# Patient Record
Sex: Female | Born: 1945
Health system: Southern US, Community
[De-identification: ages and names within clinical notes are randomized; demographics above are authoritative.]

## PROBLEM LIST (undated history)

## (undated) DIAGNOSIS — E7800 Pure hypercholesterolemia, unspecified: Secondary | ICD-10-CM

## (undated) DIAGNOSIS — J45909 Unspecified asthma, uncomplicated: Secondary | ICD-10-CM

## (undated) DIAGNOSIS — T7840XA Allergy, unspecified, initial encounter: Secondary | ICD-10-CM

## (undated) DIAGNOSIS — D649 Anemia, unspecified: Secondary | ICD-10-CM

## (undated) DIAGNOSIS — K219 Gastro-esophageal reflux disease without esophagitis: Secondary | ICD-10-CM

## (undated) DIAGNOSIS — I10 Essential (primary) hypertension: Secondary | ICD-10-CM

## (undated) HISTORY — DX: Anemia, unspecified: D64.9

## (undated) HISTORY — PX: BREAST SURGERY: SHX581

## (undated) HISTORY — DX: Gastro-esophageal reflux disease without esophagitis: K21.9

## (undated) HISTORY — DX: Essential (primary) hypertension: I10

## (undated) HISTORY — PX: TUBAL LIGATION: SHX77

## (undated) HISTORY — DX: Unspecified asthma, uncomplicated: J45.909

## (undated) HISTORY — DX: Pure hypercholesterolemia, unspecified: E78.00

## (undated) HISTORY — DX: Allergy, unspecified, initial encounter: T78.40XA

---

## 2004-12-02 ENCOUNTER — Ambulatory Visit: Payer: Self-pay | Admitting: Internal Medicine

## 2005-01-13 ENCOUNTER — Ambulatory Visit: Payer: Self-pay | Admitting: Internal Medicine

## 2005-02-25 ENCOUNTER — Ambulatory Visit: Payer: Self-pay | Admitting: Internal Medicine

## 2005-07-02 ENCOUNTER — Ambulatory Visit: Payer: Self-pay | Admitting: Internal Medicine

## 2005-07-09 ENCOUNTER — Ambulatory Visit: Payer: Self-pay | Admitting: Internal Medicine

## 2012-05-25 ENCOUNTER — Encounter: Payer: Self-pay | Admitting: Nurse Practitioner

## 2012-05-25 ENCOUNTER — Ambulatory Visit (INDEPENDENT_AMBULATORY_CARE_PROVIDER_SITE_OTHER): Payer: Medicare Other | Admitting: Nurse Practitioner

## 2012-05-25 VITALS — BP 132/74 | HR 73 | Temp 97.3°F | Ht 63.0 in | Wt 155.0 lb

## 2012-05-25 DIAGNOSIS — J45909 Unspecified asthma, uncomplicated: Secondary | ICD-10-CM | POA: Insufficient documentation

## 2012-05-25 DIAGNOSIS — J309 Allergic rhinitis, unspecified: Secondary | ICD-10-CM

## 2012-05-25 DIAGNOSIS — I10 Essential (primary) hypertension: Secondary | ICD-10-CM | POA: Insufficient documentation

## 2012-05-25 LAB — COMPLETE METABOLIC PANEL WITH GFR
Albumin: 4.4 g/dL (ref 3.5–5.2)
Alkaline Phosphatase: 57 U/L (ref 39–117)
BUN: 15 mg/dL (ref 6–23)
CO2: 28 mEq/L (ref 19–32)
Calcium: 9.6 mg/dL (ref 8.4–10.5)
Chloride: 103 mEq/L (ref 96–112)
GFR, Est African American: 81 mL/min
GFR, Est Non African American: 71 mL/min
Glucose, Bld: 111 mg/dL — ABNORMAL HIGH (ref 70–99)
Potassium: 4.1 mEq/L (ref 3.5–5.3)
Total Protein: 6.9 g/dL (ref 6.0–8.3)

## 2012-05-25 MED ORDER — BECLOMETHASONE DIPROPIONATE 80 MCG/ACT IN AERS: 2.0000 | INHALATION_SPRAY | Freq: Two times a day (BID) | RESPIRATORY_TRACT | Status: DC

## 2012-05-25 MED ORDER — FLUTICASONE PROPIONATE 50 MCG/ACT NA SUSP: 2.0000 | Freq: Every day | NASAL | Status: DC

## 2012-05-25 MED ORDER — OLMESARTAN MEDOXOMIL-HCTZ 40-25 MG PO TABS: 1.0000 | ORAL_TABLET | Freq: Every day | ORAL | Status: DC

## 2012-05-25 NOTE — Progress Notes (Signed)
Subjective:    Patient ID: Regina Knox, female    DOB: 14-Jul-1945, 67 y.o.   MRN: [id number]  Hypertension This is a chronic problem. The current episode started more than 1 year ago. The problem has been resolved since onset. Pertinent negatives include no blurred vision, chest pain, headaches, neck pain, palpitations, peripheral edema or shortness of breath. There are no associated agents to hypertension. Risk factors for coronary artery disease include family history and post-menopausal state. Past treatments include angiotensin blockers and diuretics. The current treatment provides significant improvement. Compliance problems include exercise and diet.  There is no history of kidney disease, heart failure or a thyroid problem. There is no history of chronic renal disease or sleep apnea.  Asthma She complains of frequent throat clearing. There is no chest tightness, cough, difficulty breathing or shortness of breath. This is a chronic problem. The current episode started more than 1 year ago. The problem occurs rarely. The problem has been gradually improving. Associated symptoms include nasal congestion, postnasal drip and sneezing. Pertinent negatives include no chest pain, ear congestion, ear pain, fever, headaches, heartburn, sore throat or trouble swallowing. Her symptoms are aggravated by nothing. Her symptoms are alleviated by rest. She reports moderate improvement on treatment. There are no known risk factors for lung disease. Her past medical history is significant for asthma. There is no history of COPD, emphysema or pneumonia.      Review of Systems  Constitutional: Negative.  Negative for fever.  HENT: Positive for sneezing and postnasal drip. Negative for ear pain, sore throat, trouble swallowing and neck pain.   Eyes: Negative.  Negative for blurred vision.  Respiratory: Negative.  Negative for cough and shortness of breath.   Cardiovascular: Negative.  Negative for chest  pain and palpitations.  Gastrointestinal: Negative.  Negative for heartburn.  Endocrine: Negative.   Genitourinary: Negative.   Musculoskeletal: Negative.   Allergic/Immunologic: Positive for environmental allergies (seasonal).  Neurological: Negative.  Negative for headaches.  Psychiatric/Behavioral: Negative.        Objective:   Physical Exam  Constitutional: She is oriented to person, place, and time. She appears well-developed and well-nourished.  HENT:  Nose: Nose normal.  Mouth/Throat: Oropharynx is clear and moist.  Eyes: EOM are normal.  Neck: Trachea normal, normal range of motion and full passive range of motion without pain. Neck supple. No JVD present. Carotid bruit is not present. No thyromegaly present.  Cardiovascular: Normal rate, normal heart sounds and intact distal pulses.  Exam reveals no gallop and no friction rub.   No murmur heard. Pulmonary/Chest: Effort normal and breath sounds normal.  Abdominal: Soft. Bowel sounds are normal. She exhibits no distension and no mass. There is no tenderness.  Musculoskeletal: Normal range of motion.  Lymphadenopathy:    She has no cervical adenopathy.  Neurological: She is alert and oriented to person, place, and time. She has normal reflexes.  Skin: Skin is warm and dry.  Psychiatric: She has a normal mood and affect. Her behavior is normal. Judgment and thought content normal.     BP 132/74  Pulse 73  Temp(Src) 97.3 F (36.3 C) (Oral)  Ht 5\' 3"  (1.6 m)  Wt 155 lb (70.308 kg)  BMI 27.46 kg/m2  LMP 05/25/1996      Assessment & Plan:  1. Asthma, chronic AVOID ALLERGENS - beclomethasone (QVAR) 80 MCG/ACT inhaler; Inhale 2 puffs into the lungs 2 (two) times daily.  Dispense: 1 Inhaler; Refill: 3  2. Essential hypertension, benign Low  fat and low Na+ diet Exercise encouraged - olmesartan-hydrochlorothiazide (BENICAR HCT) 40-25 MG per tablet; Take 1 tablet by mouth daily.  Dispense: 30 tablet; Refill: 5 -  COMPLETE METABOLIC PANEL WITH GFR - NMR Lipoprofile with Lipids  3. Allergic rhinitis - fluticasone (FLONASE) 50 MCG/ACT nasal spray; Place 2 sprays into the nose daily.  Dispense: 1 g; Refill: 5   Health Maintenance Review and Hemocult cards given Mary-Margaret Daphine Deutscher, FNP

## 2012-05-25 NOTE — Patient Instructions (Addendum)

## 2012-05-27 ENCOUNTER — Other Ambulatory Visit: Payer: Self-pay | Admitting: Nurse Practitioner

## 2012-05-27 LAB — NMR LIPOPROFILE WITH LIPIDS
HDL Size: 9 nm — ABNORMAL LOW (ref >=9.2)
LDL Particle Number: 1359 nmol/L — ABNORMAL HIGH (ref <1000)
Large HDL-P: 7.6 umol/L (ref >=4.8)
Large VLDL-P: 2 nmol/L (ref <=2.7)
Small LDL Particle Number: 644 nmol/L — ABNORMAL HIGH (ref <=527)
VLDL Size: 42.4 nm (ref <=46.6)

## 2012-05-27 MED ORDER — SIMVASTATIN 40 MG PO TABS: 40.0000 mg | ORAL_TABLET | Freq: Every day | ORAL | Status: DC

## 2012-05-30 ENCOUNTER — Other Ambulatory Visit (INDEPENDENT_AMBULATORY_CARE_PROVIDER_SITE_OTHER): Payer: Medicare Other

## 2012-05-30 DIAGNOSIS — Z1212 Encounter for screening for malignant neoplasm of rectum: Secondary | ICD-10-CM

## 2012-05-30 NOTE — Progress Notes (Signed)
Pt dropped off FOBT only 

## 2012-12-05 ENCOUNTER — Other Ambulatory Visit: Payer: Self-pay

## 2012-12-05 DIAGNOSIS — J45909 Unspecified asthma, uncomplicated: Secondary | ICD-10-CM

## 2012-12-05 MED ORDER — BECLOMETHASONE DIPROPIONATE 80 MCG/ACT IN AERS: 2.0000 | INHALATION_SPRAY | Freq: Two times a day (BID) | RESPIRATORY_TRACT | Status: DC

## 2012-12-05 NOTE — Telephone Encounter (Signed)
Last seen 05/25/12  MMM

## 2012-12-22 ENCOUNTER — Other Ambulatory Visit: Payer: Self-pay

## 2013-03-09 ENCOUNTER — Other Ambulatory Visit: Payer: Self-pay | Admitting: *Deleted

## 2013-03-09 DIAGNOSIS — J45909 Unspecified asthma, uncomplicated: Secondary | ICD-10-CM

## 2013-03-09 MED ORDER — BECLOMETHASONE DIPROPIONATE 80 MCG/ACT IN AERS: 2.0000 | INHALATION_SPRAY | Freq: Two times a day (BID) | RESPIRATORY_TRACT | Status: DC

## 2013-03-09 NOTE — Telephone Encounter (Signed)
Last OV 4/14. ntbs

## 2013-04-20 ENCOUNTER — Other Ambulatory Visit: Payer: Self-pay | Admitting: Nurse Practitioner

## 2013-11-05 ENCOUNTER — Emergency Department (HOSPITAL_COMMUNITY): Payer: Medicare Other

## 2013-11-05 ENCOUNTER — Encounter (HOSPITAL_COMMUNITY): Payer: Self-pay | Admitting: Emergency Medicine

## 2013-11-05 ENCOUNTER — Observation Stay (HOSPITAL_COMMUNITY)
Admission: EM | Admit: 2013-11-05 | Discharge: 2013-11-08 | Disposition: A | Discharge status: Home Health | Payer: Medicare Other | Attending: Internal Medicine | Admitting: Internal Medicine

## 2013-11-05 DIAGNOSIS — Z886 Allergy status to analgesic agent status: Secondary | ICD-10-CM | POA: Insufficient documentation

## 2013-11-05 DIAGNOSIS — W19XXXA Unspecified fall, initial encounter: Secondary | ICD-10-CM | POA: Diagnosis not present

## 2013-11-05 DIAGNOSIS — J309 Allergic rhinitis, unspecified: Secondary | ICD-10-CM

## 2013-11-05 DIAGNOSIS — S82843A Displaced bimalleolar fracture of unspecified lower leg, initial encounter for closed fracture: Principal | ICD-10-CM | POA: Insufficient documentation

## 2013-11-05 DIAGNOSIS — S82841A Displaced bimalleolar fracture of right lower leg, initial encounter for closed fracture: Secondary | ICD-10-CM

## 2013-11-05 DIAGNOSIS — I10 Essential (primary) hypertension: Secondary | ICD-10-CM

## 2013-11-05 DIAGNOSIS — J45909 Unspecified asthma, uncomplicated: Secondary | ICD-10-CM

## 2013-11-05 DIAGNOSIS — Y92009 Unspecified place in unspecified non-institutional (private) residence as the place of occurrence of the external cause: Secondary | ICD-10-CM | POA: Diagnosis not present

## 2013-11-05 MED ORDER — FENTANYL CITRATE 0.05 MG/ML IJ SOLN
100.0000 ug | Freq: Once | INTRAMUSCULAR | Status: AC
  Administered 2013-11-05: 100 ug via INTRAVENOUS
  Filled 2013-11-05: qty 2

## 2013-11-05 NOTE — ED Provider Notes (Signed)
Patient seen/examined in the Emergency Department in conjunction with Midlevel Provider Idol Patient reports right ankle pain after fall Exam : tenderness/deformity to right ankle.  Distal pulses intact Plan: consult ortho    Sharyon Cable, MD 11/05/13 2357

## 2013-11-05 NOTE — ED Notes (Signed)
Fell carrying boxes at home. Turned R ankle and was unable to stand on R foot afterward.  Denies hitting head or any other injury.  No LOC.

## 2013-11-05 NOTE — ED Provider Notes (Signed)
CSN: [id number]     Arrival date & time 11/05/13  2230 History   First MD Initiated Contact with Patient 11/05/13 2307     Chief Complaint  Patient presents with  . Fall  . Ankle Pain     (Consider location/radiation/quality/duration/timing/severity/associated sxs/prior Treatment) The history is provided by the patient.   Regina Knox is a 68 y.o. female with a history of hypertension and asthma, presenting with right ankle pain and swelling after tripping in her home. She was carrying boxes and was unable to see the floor in front of her causing her to fall.  She landed on her right side, twisting the ankle. She was unable to weight bear after the event and presented via ambulance.  She denies any other pain or injury including knee, hip and head injury. She has had no medicines prior to arrival and reports the pain is tolerable except when moving the extremity.  She denies numbness in her foot or toes.  She has a temporary splint and ice pack in place.     Past Medical History  Diagnosis Date  . Asthma   . Hypertension   . Allergy    Past Surgical History  Procedure Laterality Date  . Breast surgery    . Tubal ligation     Family History  Problem Relation Age of Onset  . Hypertension Mother   . Drug abuse Mother   . Hypertension Father   . Heart disease Father   . Cancer Sister   . Heart disease Brother    History  Substance Use Topics  . Smoking status: Never Smoker   . Smokeless tobacco: Not on file  . Alcohol Use: No   OB History   Grav Para Term Preterm Abortions TAB SAB Ect Mult Living                 Review of Systems  Constitutional: Negative for fever.  Musculoskeletal: Positive for arthralgias, gait problem and joint swelling. Negative for myalgias.  Neurological: Negative for weakness and numbness.      Allergies  Ibuprofen  Home Medications   Prior to Admission medications   Medication Sig Start Date End Date Taking? Authorizing Provider   beclomethasone (QVAR) 80 MCG/ACT inhaler Inhale 2 puffs into the lungs 2 (two) times daily. 03/09/13   Mary-Margaret Hassell Done, FNP  fluticasone (FLONASE) 50 MCG/ACT nasal spray Place 2 sprays into the nose daily. 05/25/12   Mary-Margaret Hassell Done, FNP  olmesartan-hydrochlorothiazide (BENICAR HCT) 40-25 MG per tablet Take 1 tablet by mouth daily. 05/25/12   Mary-Margaret Hassell Done, FNP  simvastatin (ZOCOR) 40 MG tablet Take 1 tablet (40 mg total) by mouth at bedtime. 05/27/12   Mary-Margaret Hassell Done, FNP   BP 133/83  Pulse 98  Temp(Src) 98.1 F (36.7 C) (Oral)  Resp 16  Ht 5\' 4"  (1.626 m)  Wt 155 lb (70.308 kg)  BMI 26.59 kg/m2  SpO2 97%  LMP 05/25/1996 Physical Exam  Nursing note and vitals reviewed. Constitutional: She appears well-developed and well-nourished.  HENT:  Head: Normocephalic.  Cardiovascular: Normal rate and intact distal pulses.  Exam reveals no decreased pulses.   Pulses:      Dorsalis pedis pulses are 2+ on the right side.  Musculoskeletal: She exhibits edema and tenderness.       Right shoulder: She exhibits decreased range of motion, bony tenderness, swelling and deformity. She exhibits normal pulse.       Right ankle: She exhibits decreased range of motion, swelling and  ecchymosis. She exhibits normal pulse. Tenderness. Lateral malleolus tenderness found. No head of 5th metatarsal and no proximal fibula tenderness found. Achilles tendon normal.  Neurological: She is alert. No sensory deficit.  Skin: Skin is warm, dry and intact.    ED Course  Procedures (including critical care time) Labs Review Labs Reviewed - No data to display  Imaging Review Dg Ankle Complete Right  11/05/2013   CLINICAL DATA:  Inversion injury with pain in deformity to right ankle today  EXAM: RIGHT ANKLE - COMPLETE 3+ VIEW  COMPARISON:  None.  FINDINGS: There is a fracture through the medial malleolus, with the distal fracture fragment displaced medially by about 5 mm. There is an oblique mildly  displaced fracture of the distal fibular metaphysis. There is diffuse moderate soft tissue swelling around the ankle. There is a joint effusion. On the lateral view there is a tiny bone fragment seen anterior to the neck of the talus, which does not appear to arise from the talus.  IMPRESSION: Fractures of the medial and lateral malleoli.   Electronically Signed   By: Skipper Cliche M.D.   On: 11/05/2013 23:31     EKG Interpretation None      MDM   Final diagnoses:  None    12:08 AM Call placed to Dr. Mardelle Matte with ortho.  Reviewed film.  Recommended hospitalist admission, posterior splint, non emergent ortho consult in am.    Discussed this plan with patient who is agreeable.  Basic labs drawn.  Pt nauseated, zofran IV given.  Posterior splint applied.  Dr Christy Gentles will follow patient and arrange admission once labs resulted.   Evalee Jefferson, PA-C 11/06/13 [phone]

## 2013-11-06 ENCOUNTER — Observation Stay (HOSPITAL_COMMUNITY): Payer: Medicare Other

## 2013-11-06 ENCOUNTER — Encounter (HOSPITAL_COMMUNITY): Payer: Self-pay | Admitting: *Deleted

## 2013-11-06 ENCOUNTER — Encounter (HOSPITAL_COMMUNITY): Admission: EM | Disposition: A | Discharge status: Home Health | Payer: Self-pay | Source: Home / Self Care

## 2013-11-06 ENCOUNTER — Observation Stay (HOSPITAL_COMMUNITY): Payer: Medicare Other | Admitting: Anesthesiology

## 2013-11-06 ENCOUNTER — Emergency Department (HOSPITAL_COMMUNITY): Payer: Medicare Other

## 2013-11-06 ENCOUNTER — Encounter (HOSPITAL_COMMUNITY): Payer: Medicare Other | Admitting: Anesthesiology

## 2013-11-06 DIAGNOSIS — S82841A Displaced bimalleolar fracture of right lower leg, initial encounter for closed fracture: Secondary | ICD-10-CM | POA: Diagnosis present

## 2013-11-06 DIAGNOSIS — S82843A Displaced bimalleolar fracture of unspecified lower leg, initial encounter for closed fracture: Secondary | ICD-10-CM

## 2013-11-06 DIAGNOSIS — I10 Essential (primary) hypertension: Secondary | ICD-10-CM | POA: Diagnosis not present

## 2013-11-06 DIAGNOSIS — Z886 Allergy status to analgesic agent status: Secondary | ICD-10-CM | POA: Diagnosis not present

## 2013-11-06 DIAGNOSIS — J45909 Unspecified asthma, uncomplicated: Secondary | ICD-10-CM

## 2013-11-06 HISTORY — PX: ORIF ANKLE FRACTURE: SHX5408

## 2013-11-06 LAB — SURGICAL PCR SCREEN
MRSA, PCR: NEGATIVE
Staphylococcus aureus: NEGATIVE

## 2013-11-06 LAB — CBC WITH DIFFERENTIAL/PLATELET
BASOS ABS: 0.1 10*3/uL (ref 0.0–0.1)
BASOS PCT: 0 % (ref 0–1)
EOS PCT: 1 % (ref 0–5)
Eosinophils Absolute: 0.2 10*3/uL (ref 0.0–0.7)
HEMATOCRIT: 40 % (ref 36.0–46.0)
Hemoglobin: 14.1 g/dL (ref 12.0–15.0)
Lymphocytes Relative: 14 % (ref 12–46)
Lymphs Abs: 2 10*3/uL (ref 0.7–4.0)
MCH: 33 pg (ref 26.0–34.0)
MCHC: 35.3 g/dL (ref 30.0–36.0)
MCV: 93.7 fL (ref 78.0–100.0)
MONO ABS: 0.9 10*3/uL (ref 0.1–1.0)
Monocytes Relative: 6 % (ref 3–12)
Neutro Abs: 11.5 10*3/uL — ABNORMAL HIGH (ref 1.7–7.7)
Neutrophils Relative %: 79 % — ABNORMAL HIGH (ref 43–77)
Platelets: 229 10*3/uL (ref 150–400)
RBC: 4.27 MIL/uL (ref 3.87–5.11)
RDW: 13.1 % (ref 11.5–15.5)
WBC: 14.7 10*3/uL — ABNORMAL HIGH (ref 4.0–10.5)

## 2013-11-06 LAB — BASIC METABOLIC PANEL
ANION GAP: 12 (ref 5–15)
BUN: 19 mg/dL (ref 6–23)
CALCIUM: 8.6 mg/dL (ref 8.4–10.5)
CO2: 24 mEq/L (ref 19–32)
CREATININE: 0.78 mg/dL (ref 0.50–1.10)
Chloride: 99 mEq/L (ref 96–112)
GFR calc non Af Amer: 83 mL/min — ABNORMAL LOW (ref >90)
Glucose, Bld: 180 mg/dL — ABNORMAL HIGH (ref 70–99)
Potassium: 3.4 mEq/L — ABNORMAL LOW (ref 3.7–5.3)
Sodium: 135 mEq/L — ABNORMAL LOW (ref 137–147)

## 2013-11-06 LAB — HEMOGLOBIN A1C
Hgb A1c MFr Bld: 6.2 % — ABNORMAL HIGH (ref <5.7)
Mean Plasma Glucose: 131 mg/dL — ABNORMAL HIGH (ref <117)

## 2013-11-06 LAB — GLUCOSE, CAPILLARY
GLUCOSE-CAPILLARY: 131 mg/dL — AB (ref 70–99)
Glucose-Capillary: 127 mg/dL — ABNORMAL HIGH (ref 70–99)
Glucose-Capillary: 163 mg/dL — ABNORMAL HIGH (ref 70–99)
Glucose-Capillary: 185 mg/dL — ABNORMAL HIGH (ref 70–99)

## 2013-11-06 SURGERY — OPEN REDUCTION INTERNAL FIXATION (ORIF) ANKLE FRACTURE
Anesthesia: Spinal | Site: Ankle | Laterality: Right

## 2013-11-06 MED ORDER — PROPOFOL 10 MG/ML IV EMUL
INTRAVENOUS | Status: AC
  Filled 2013-11-06: qty 20

## 2013-11-06 MED ORDER — 0.9 % SODIUM CHLORIDE (POUR BTL) OPTIME
TOPICAL | Status: DC | PRN
  Administered 2013-11-06: 1000 mL

## 2013-11-06 MED ORDER — ONDANSETRON HCL 4 MG/2ML IJ SOLN
INTRAMUSCULAR | Status: AC
  Administered 2013-11-06: 4 mg
  Filled 2013-11-06: qty 2

## 2013-11-06 MED ORDER — FENTANYL CITRATE 0.05 MG/ML IJ SOLN
INTRAMUSCULAR | Status: AC
  Filled 2013-11-06: qty 2

## 2013-11-06 MED ORDER — HYDROMORPHONE HCL 1 MG/ML IJ SOLN
1.0000 mg | INTRAMUSCULAR | Status: DC | PRN
  Administered 2013-11-06: 1 mg via INTRAVENOUS
  Filled 2013-11-06: qty 1

## 2013-11-06 MED ORDER — EPHEDRINE SULFATE 50 MG/ML IJ SOLN
INTRAMUSCULAR | Status: AC
  Filled 2013-11-06: qty 1

## 2013-11-06 MED ORDER — FENTANYL CITRATE 0.05 MG/ML IJ SOLN: 25.0000 ug | INTRAMUSCULAR | Status: DC | PRN

## 2013-11-06 MED ORDER — CEFAZOLIN SODIUM-DEXTROSE 2-3 GM-% IV SOLR
2.0000 g | Freq: Once | INTRAVENOUS | Status: AC
  Administered 2013-11-06: 2 g via INTRAVENOUS

## 2013-11-06 MED ORDER — MIDAZOLAM HCL 2 MG/2ML IJ SOLN
INTRAMUSCULAR | Status: AC
  Filled 2013-11-06: qty 2

## 2013-11-06 MED ORDER — ALBUTEROL SULFATE (2.5 MG/3ML) 0.083% IN NEBU: 2.5000 mg | INHALATION_SOLUTION | Freq: Four times a day (QID) | RESPIRATORY_TRACT | Status: DC | PRN

## 2013-11-06 MED ORDER — SODIUM CHLORIDE 0.9 % IN NEBU
INHALATION_SOLUTION | RESPIRATORY_TRACT | Status: AC
  Filled 2013-11-06: qty 3

## 2013-11-06 MED ORDER — ALBUTEROL SULFATE (2.5 MG/3ML) 0.083% IN NEBU
2.5000 mg | INHALATION_SOLUTION | Freq: Once | RESPIRATORY_TRACT | Status: AC
  Administered 2013-11-06: 2.5 mg via RESPIRATORY_TRACT

## 2013-11-06 MED ORDER — ONDANSETRON HCL 4 MG/2ML IJ SOLN
4.0000 mg | Freq: Once | INTRAMUSCULAR | Status: AC
  Administered 2013-11-06: 4 mg via INTRAMUSCULAR
  Filled 2013-11-06: qty 2

## 2013-11-06 MED ORDER — LACTATED RINGERS IV SOLN
INTRAVENOUS | Status: DC
  Administered 2013-11-06: 13:00:00 via INTRAVENOUS

## 2013-11-06 MED ORDER — DEXAMETHASONE SODIUM PHOSPHATE 4 MG/ML IJ SOLN
4.0000 mg | Freq: Once | INTRAMUSCULAR | Status: AC
  Administered 2013-11-06: 4 mg via INTRAVENOUS

## 2013-11-06 MED ORDER — SODIUM CHLORIDE 0.9 % IJ SOLN
INTRAMUSCULAR | Status: AC
  Filled 2013-11-06: qty 10

## 2013-11-06 MED ORDER — HYDROCODONE-ACETAMINOPHEN 5-325 MG PO TABS: 1.0000 | ORAL_TABLET | Freq: Four times a day (QID) | ORAL | Status: DC | PRN

## 2013-11-06 MED ORDER — FENTANYL CITRATE 0.05 MG/ML IJ SOLN
25.0000 ug | INTRAMUSCULAR | Status: AC
  Administered 2013-11-06 (×2): 25 ug via INTRAVENOUS

## 2013-11-06 MED ORDER — FENTANYL CITRATE 0.05 MG/ML IJ SOLN
INTRAMUSCULAR | Status: DC | PRN
  Administered 2013-11-06: 25 ug via INTRAVENOUS

## 2013-11-06 MED ORDER — ACETAMINOPHEN 325 MG PO TABS: 650.0000 mg | ORAL_TABLET | Freq: Four times a day (QID) | ORAL | Status: DC | PRN

## 2013-11-06 MED ORDER — ONDANSETRON HCL 4 MG/2ML IJ SOLN: 4.0000 mg | Freq: Once | INTRAMUSCULAR | Status: DC | PRN

## 2013-11-06 MED ORDER — POTASSIUM CHLORIDE IN NACL 20-0.9 MEQ/L-% IV SOLN
INTRAVENOUS | Status: AC
  Administered 2013-11-06: 03:00:00 via INTRAVENOUS

## 2013-11-06 MED ORDER — ONDANSETRON HCL 4 MG/2ML IJ SOLN
4.0000 mg | Freq: Once | INTRAMUSCULAR | Status: AC
  Administered 2013-11-06: 4 mg via INTRAVENOUS

## 2013-11-06 MED ORDER — PROPOFOL INFUSION 10 MG/ML OPTIME
INTRAVENOUS | Status: DC | PRN
  Administered 2013-11-06: 45 ug/kg/min via INTRAVENOUS

## 2013-11-06 MED ORDER — CEFAZOLIN SODIUM-DEXTROSE 2-3 GM-% IV SOLR
INTRAVENOUS | Status: AC
  Filled 2013-11-06: qty 50

## 2013-11-06 MED ORDER — MIDAZOLAM HCL 5 MG/5ML IJ SOLN
INTRAMUSCULAR | Status: DC | PRN
  Administered 2013-11-06: 2 mg via INTRAVENOUS

## 2013-11-06 MED ORDER — ONDANSETRON HCL 4 MG PO TABS: 4.0000 mg | ORAL_TABLET | Freq: Four times a day (QID) | ORAL | Status: DC | PRN

## 2013-11-06 MED ORDER — ONDANSETRON HCL 4 MG/2ML IJ SOLN
INTRAMUSCULAR | Status: AC
  Filled 2013-11-06: qty 2

## 2013-11-06 MED ORDER — ALBUTEROL SULFATE (2.5 MG/3ML) 0.083% IN NEBU
2.5000 mg | INHALATION_SOLUTION | Freq: Four times a day (QID) | RESPIRATORY_TRACT | Status: DC
Start: 2013-11-06 — End: 2013-11-06
  Administered 2013-11-06: 2.5 mg via RESPIRATORY_TRACT
  Filled 2013-11-06: qty 3

## 2013-11-06 MED ORDER — ALBUTEROL SULFATE (2.5 MG/3ML) 0.083% IN NEBU
INHALATION_SOLUTION | RESPIRATORY_TRACT | Status: AC
  Filled 2013-11-06: qty 3

## 2013-11-06 MED ORDER — BUPIVACAINE IN DEXTROSE 0.75-8.25 % IT SOLN
INTRATHECAL | Status: DC | PRN
  Administered 2013-11-06: 15 mg via INTRATHECAL

## 2013-11-06 MED ORDER — ENOXAPARIN SODIUM 40 MG/0.4ML ~~LOC~~ SOLN
40.0000 mg | SUBCUTANEOUS | Status: DC
  Administered 2013-11-07 – 2013-11-08 (×2): 40 mg via SUBCUTANEOUS
  Filled 2013-11-06 (×2): qty 0.4

## 2013-11-06 MED ORDER — CEFAZOLIN SODIUM-DEXTROSE 2-3 GM-% IV SOLR: 2.0000 g | Freq: Once | INTRAVENOUS | Status: DC

## 2013-11-06 MED ORDER — EPHEDRINE SULFATE 50 MG/ML IJ SOLN
INTRAMUSCULAR | Status: DC | PRN
  Administered 2013-11-06: 5 mg via INTRAVENOUS
  Administered 2013-11-06 (×2): 10 mg via INTRAVENOUS

## 2013-11-06 MED ORDER — FENTANYL CITRATE 0.05 MG/ML IJ SOLN
100.0000 ug | Freq: Once | INTRAMUSCULAR | Status: AC
Start: 2013-11-06 — End: 2013-11-06
  Administered 2013-11-06: 100 ug via INTRAVENOUS

## 2013-11-06 MED ORDER — MIDAZOLAM HCL 2 MG/2ML IJ SOLN
1.0000 mg | INTRAMUSCULAR | Status: DC | PRN
  Administered 2013-11-06: 2 mg via INTRAVENOUS

## 2013-11-06 MED ORDER — DEXAMETHASONE SODIUM PHOSPHATE 4 MG/ML IJ SOLN
INTRAMUSCULAR | Status: AC
  Filled 2013-11-06: qty 1

## 2013-11-06 MED ORDER — FENTANYL CITRATE 0.05 MG/ML IJ SOLN
INTRAMUSCULAR | Status: DC | PRN
  Administered 2013-11-06: 25 ug via INTRATHECAL

## 2013-11-06 MED ORDER — BUPIVACAINE IN DEXTROSE 0.75-8.25 % IT SOLN
INTRATHECAL | Status: AC
  Filled 2013-11-06: qty 2

## 2013-11-06 MED ORDER — ONDANSETRON HCL 4 MG/2ML IJ SOLN
4.0000 mg | Freq: Four times a day (QID) | INTRAMUSCULAR | Status: DC | PRN
  Administered 2013-11-06: 4 mg via INTRAVENOUS
  Filled 2013-11-06: qty 2

## 2013-11-06 SURGICAL SUPPLY — 61 items
BAG HAMPER (MISCELLANEOUS) ×2 IMPLANT
BANDAGE ELASTIC 4 VELCRO NS (GAUZE/BANDAGES/DRESSINGS) ×2 IMPLANT
BANDAGE ELASTIC 6 VELCRO NS (GAUZE/BANDAGES/DRESSINGS) ×2 IMPLANT
BANDAGE ESMARK 4X12 BL STRL LF (DISPOSABLE) ×1 IMPLANT
BIT DRILL 2.5X110 QC LCP DISP (BIT) ×2 IMPLANT
BIT DRILL 2.8 (BIT) ×1
BIT DRILL CANN QC 2.8X165 (BIT) IMPLANT
BIT DRILL JC END 3.2X130 (BIT) ×1 IMPLANT
BLADE 10 SAFETY STRL DISP (BLADE) ×2 IMPLANT
BLADE 15 SAFETY STRL DISP (BLADE) ×2 IMPLANT
BNDG CMPR 12X4 ELC STRL LF (DISPOSABLE) ×1
BNDG ESMARK 4X12 BLUE STRL LF (DISPOSABLE) ×2
CLOTH BEACON ORANGE TIMEOUT ST (SAFETY) ×2 IMPLANT
COVER LIGHT HANDLE STERIS (MISCELLANEOUS) ×4 IMPLANT
COVER MAYO STAND XLG (DRAPE) ×2 IMPLANT
CUFF TOURNIQUET SINGLE 34IN LL (TOURNIQUET CUFF) ×2 IMPLANT
DRAPE C-ARM FOLDED MOBILE STRL (DRAPES) ×1 IMPLANT
DRILL BIT 2.8MM (BIT) ×2
DURAPREP 26ML APPLICATOR (WOUND CARE) ×2 IMPLANT
GAUZE SPONGE 4X4 12PLY STRL (GAUZE/BANDAGES/DRESSINGS) ×2 IMPLANT
GAUZE XEROFORM 5X9 LF (GAUZE/BANDAGES/DRESSINGS) ×2 IMPLANT
GLOVE BIO SURGEON STRL SZ8 (GLOVE) ×2 IMPLANT
GLOVE BIO SURGEON STRL SZ8.5 (GLOVE) ×2 IMPLANT
GLOVE BIOGEL M 7.0 STRL (GLOVE) ×1 IMPLANT
GLOVE BIOGEL PI IND STRL 7.0 (GLOVE) IMPLANT
GLOVE BIOGEL PI INDICATOR 7.0 (GLOVE) ×2
GLOVE ECLIPSE 7.0 STRL STRAW (GLOVE) ×1 IMPLANT
GOWN STRL REUS W/TWL LRG LVL3 (GOWN DISPOSABLE) ×5 IMPLANT
GOWN STRL REUS W/TWL XL LVL3 (GOWN DISPOSABLE) ×2 IMPLANT
INST SET MINOR BONE (KITS) ×2 IMPLANT
K-WIRE 1.6 (WIRE) ×2
K-WIRE FX5X1.6XNS BN SS (WIRE) ×1
KIT ROOM TURNOVER APOR (KITS) ×2 IMPLANT
KWIRE FX5X1.6XNS BN SS (WIRE) ×1 IMPLANT
MANIFOLD NEPTUNE II (INSTRUMENTS) ×2 IMPLANT
NS IRRIG 1000ML POUR BTL (IV SOLUTION) ×2 IMPLANT
PACK BASIC LIMB (CUSTOM PROCEDURE TRAY) ×2 IMPLANT
PAD ABD 5X9 TENDERSORB (GAUZE/BANDAGES/DRESSINGS) ×2 IMPLANT
PAD ARMBOARD 7.5X6 YLW CONV (MISCELLANEOUS) ×2 IMPLANT
PAD CAST 4YDX4 CTTN HI CHSV (CAST SUPPLIES) ×1 IMPLANT
PADDING CAST COTTON 4X4 STRL (CAST SUPPLIES) ×4
PADDING WEBRIL 3 STERILE (GAUZE/BANDAGES/DRESSINGS) ×1 IMPLANT
PLATE LCP 3.5 1/3 TUB 6HX69 (Plate) ×1 IMPLANT
SCREW CANC FT ST SFS 4X16 (Screw) ×2 IMPLANT
SCREW CORTEX 3.5 14MM (Screw) ×1 IMPLANT
SCREW CORTEX 3.5 18MM (Screw) ×1 IMPLANT
SCREW CORTEX 3.5 20MM (Screw) ×1 IMPLANT
SCREW LOCK CORT ST 3.5X14 (Screw) IMPLANT
SCREW LOCK CORT ST 3.5X18 (Screw) IMPLANT
SCREW LOCK CORT ST 3.5X20 (Screw) IMPLANT
SCREW LOCK T15 FT 16X3.5X2.9X (Screw) IMPLANT
SCREW LOCKING 3.5X16 (Screw) ×2 IMPLANT
SCREW PROS MALLEO 55 26MM (Screw) ×1 IMPLANT
SET BASIN LINEN APH (SET/KITS/TRAYS/PACK) ×2 IMPLANT
SPLINT J 5 (CAST SUPPLIES) ×1 IMPLANT
SPONGE LAP 18X18 X RAY DECT (DISPOSABLE) ×2 IMPLANT
STAPLER VISISTAT 35W (STAPLE) ×1 IMPLANT
SUT CHROMIC 2 0 CT 1 (SUTURE) ×3 IMPLANT
SUT NUROLON CT 2 BLK #1 18IN (SUTURE) IMPLANT
SUT PLAIN 2 0 XLH (SUTURE) ×3 IMPLANT
TOWEL OR 17X26 4PK STRL BLUE (TOWEL DISPOSABLE) ×2 IMPLANT

## 2013-11-06 NOTE — Progress Notes (Signed)
  PROGRESS NOTE  Regina Knox OQH:[id number] DOB: 12/16/1945 DOA: 11/05/2013 PCP: Redge Gainer, MD  Summary: 68 year old woman who presented with mechanical fall and a right ankle fracture. Underwent surgery 9/21.  Assessment/Plan: 1. Bimalleolar fracture right ankle status post surgical repair 9/21. 2. History of essential hypertension, chronic asthma. Both these issues stable.   No active medical issues. Disposition per orthopedics.  Code Status: full code DVT prophylaxis: Lovenox Family Communication: family at bedside Disposition Plan: home  Murray Hodgkins, MD  [facility]  Pager [phone] If 7PM-7AM, please contact night-coverage at [email or web address], password [facility] 11/06/2013, 7:17 PM  LOS: 1 day   Consultants:  Orthopedics  Procedures:  Open reduction internal fixation right ankle  Antibiotics:    HPI/Subjective: Feeling much better today. No specific complaints.  Objective: Filed Vitals:   11/06/13 1540 11/06/13 1625 11/06/13 1743 11/06/13 1856  BP: 119/62 124/62  123/97  Pulse: 107 101 100 118  Temp: 97.3 F (36.3 C) 97.9 F (36.6 C)    TempSrc:      Resp: 17 18 18 20   Height:      Weight:      SpO2: 96% 94% 93% 95%    Intake/Output Summary (Last 24 hours) at 11/06/13 1917 Last data filed at 11/06/13 1700  Gross per 24 hour  Intake    900 ml  Output    300 ml  Net    600 ml     Filed Weights   11/05/13 2234  Weight: 70.308 kg (155 lb)    Exam:     Afebrile, VSS  Appears calm and comfortable  Speech fluent and clear  CV RRR no m/r/g. No LLE edema. Right leg wrapped  Resp CTA bilaterally no wrr. Normal resp effort.  Data Reviewed:  BMP and CBC on admission noted  Scheduled Meds: . albuterol  2.5 mg Nebulization Q6H  . [START ON 11/07/2013] enoxaparin (LOVENOX) injection  40 mg Subcutaneous Q24H   Continuous Infusions:   Principal Problem:   Bimalleolar fracture of right ankle Active Problems:   Asthma, chronic  Essential hypertension, benign   Time spent 20 minutes

## 2013-11-06 NOTE — Anesthesia Preprocedure Evaluation (Signed)
Anesthesia Evaluation  Patient identified by MRN, date of birth, ID band Patient awake    Reviewed: Allergy & Precautions, H&P , NPO status , Patient's Chart, lab work & pertinent test results  Airway Mallampati: II TM Distance: >3 FB     Dental  (+) Teeth Intact, Partial Lower   Pulmonary asthma ,  breath sounds clear to auscultation        Cardiovascular hypertension, Pt. on medications Rhythm:Regular Rate:Normal     Neuro/Psych    GI/Hepatic GERD-  Medicated and Controlled,  Endo/Other    Renal/GU      Musculoskeletal   Abdominal   Peds  Hematology   Anesthesia Other Findings   Reproductive/Obstetrics                           Anesthesia Physical Anesthesia Plan  ASA: II  Anesthesia Plan: Spinal   Post-op Pain Management:    Induction:   Airway Management Planned: Nasal Cannula  Additional Equipment:   Intra-op Plan:   Post-operative Plan:   Informed Consent: I have reviewed the patients History and Physical, chart, labs and discussed the procedure including the risks, benefits and alternatives for the proposed anesthesia with the patient or authorized representative who has indicated his/her understanding and acceptance.     Plan Discussed with:   Anesthesia Plan Comments: (Albuterol neb in pre-op)        Anesthesia Quick Evaluation

## 2013-11-06 NOTE — Progress Notes (Signed)
UR completed 

## 2013-11-06 NOTE — ED Provider Notes (Signed)
SPLINT APPLICATION Date/Time: [id number] Authorized by: Sharyon Cable Consent: Verbal consent obtained. Risks and benefits: risks, benefits and alternatives were discussed Consent given by: patient Splint applied by: myself and orthopedic technician Location details: right Lower extremity Splint type: posterior and stirrup Supplies used: fiberglass Post-procedure: The splinted body part was neurovascularly unchanged following the procedure. Patient tolerance: Patient tolerated the procedure well with no immediate complications.     D/w dr Shanon Brow Will admit to medical service with orthopedic consultation in the morning   Sharyon Cable, MD 11/06/13 [phone]

## 2013-11-06 NOTE — Brief Op Note (Signed)
11/05/2013 - 11/06/2013  2:45 PM  PATIENT:  Denna Haggard Lungren  68 y.o. female  PRE-OPERATIVE DIAGNOSIS:  Right BiMalleolar Fracture  POST-OPERATIVE DIAGNOSIS:  Right BiMalleolar Fracture  PROCEDURE:  Procedure(s): OPEN REDUCTION INTERNAL FIXATION (ORIF) ANKLE FRACTURE (Right)  SURGEON:  Surgeon(s) and Role:    * Sanjuana Kava, MD - Primary  PHYSICIAN ASSISTANT:   ASSISTANTS: C. Page, RN   ANESTHESIA:   spinal  EBL:  Total I/O In: 400 [I.V.:400] Out: -   BLOOD ADMINISTERED:none  DRAINS: none   LOCAL MEDICATIONS USED:  NONE  SPECIMEN:  No Specimen  DISPOSITION OF SPECIMEN:  N/A  COUNTS:  YES  TOURNIQUET:  * Missing tourniquet times found for documented tourniquets in log:  [id number] * Total Tourniquet Time Documented: Thigh (Right) - 39 minutes Total: Thigh (Right) - 39 minutes   DICTATION: .Other Dictation: Dictation Number [id number]  PLAN OF CARE: Admit to inpatient   PATIENT DISPOSITION:  PACU - hemodynamically stable.   Delay start of Pharmacological VTE agent (>24hrs) due to surgical blood loss or risk of bleeding: no

## 2013-11-06 NOTE — Anesthesia Procedure Notes (Signed)
Spinal  Patient location during procedure: OR Start time: 11/06/2013 1:48 PM Staffing CRNA/Resident: Aniko Finnigan, [place] Preanesthetic Checklist Completed: patient identified, site marked, surgical consent, pre-op evaluation, timeout performed, IV checked, risks and benefits discussed and monitors and equipment checked Spinal Block Patient position: right lateral decubitus Prep: Betadine Patient monitoring: blood pressure, continuous pulse ox, cardiac monitor and heart rate Approach: midline Location: L2-3 Injection technique: single-shot Needle Needle type: Spinocan  Needle gauge: 22 G Assessment Sensory level: T6 Additional Notes CSF clear and steady         [id number]       10/2014

## 2013-11-06 NOTE — ED Provider Notes (Signed)
Medical screening examination/treatment/procedure(s) were conducted as a shared visit with non-physician practitioner(s) and myself.  I personally evaluated the patient during the encounter.   EKG Interpretation None        Sharyon Cable, MD 11/06/13 [phone]

## 2013-11-06 NOTE — ED Notes (Addendum)
Lawernce Keas [place]- [phone] cell [phone] [facility]-home-[phone]

## 2013-11-06 NOTE — H&P (Signed)
PCP:   Redge Gainer, MD   Chief Complaint:  [place] [address] at home hurt ankle  HPI: 68 yo female fell at home tripped over something and hurt her right ankle.  No loc.  Normal state of health prior to accident.  Overall healthy.  No prior CAd.  Very active.  Has bilateral malleoli fx.  Review of Systems:  Positive and negative as per HPI otherwise all other systems are negative  Past Medical History: Past Medical History  Diagnosis Date  . Asthma   . Hypertension   . Allergy    Past Surgical History  Procedure Laterality Date  . Breast surgery    . Tubal ligation      Medications: Prior to Admission medications   Medication Sig Start Date End Date Taking? Authorizing Provider  beclomethasone (QVAR) 80 MCG/ACT inhaler Inhale 2 puffs into the lungs 2 (two) times daily. 03/09/13   Mary-Margaret Hassell Done, FNP  fluticasone (FLONASE) 50 MCG/ACT nasal spray Place 2 sprays into the nose daily. 05/25/12   Mary-Margaret Hassell Done, FNP  olmesartan-hydrochlorothiazide (BENICAR HCT) 40-25 MG per tablet Take 1 tablet by mouth daily. 05/25/12   Mary-Margaret Hassell Done, FNP  simvastatin (ZOCOR) 40 MG tablet Take 1 tablet (40 mg total) by mouth at bedtime. 05/27/12   Mary-Margaret Hassell Done, FNP    Allergies:   Allergies  Allergen Reactions  . Ibuprofen Rash    Social History:  reports that she has never smoked. She does not have any smokeless tobacco history on file. She reports that she does not drink alcohol or use illicit drugs.  Family History: Family History  Problem Relation Age of Onset  . Hypertension Mother   . Drug abuse Mother   . Hypertension Father   . Heart disease Father   . Cancer Sister   . Heart disease Brother     Physical Exam: Filed Vitals:   11/05/13 2234  BP: 133/83  Pulse: 98  Temp: 98.1 F (36.7 C)  TempSrc: Oral  Resp: 16  Height: 5\' 4"  (1.626 m)  Weight: 70.308 kg (155 lb)  SpO2: 97%   General appearance: alert, cooperative and no distress Head: Normocephalic,  without obvious abnormality, atraumatic Eyes: negative Nose: Nares normal. Septum midline. Mucosa normal. No drainage or sinus tenderness. Neck: no JVD and supple, symmetrical, trachea midline Lungs: clear to auscultation bilaterally Heart: regular rate and rhythm, S1, S2 normal, no murmur, click, rub or gallop Abdomen: soft, non-tender; bowel sounds normal; no masses,  no organomegaly Extremities: extremities normal, atraumatic, no cyanosis or edema Pulses: 2+ and symmetric Skin: Skin color, texture, turgor normal. No rashes or lesions Neurologic: Grossly normal   Labs on Admission:   Recent Labs  11/06/13 0035  NA 135*  K 3.4*  CL 99  CO2 24  GLUCOSE 180*  BUN 19  CREATININE 0.78  CALCIUM 8.6    Recent Labs  11/06/13 0035  WBC 14.7*  NEUTROABS 11.5*  HGB 14.1  HCT 40.0  MCV 93.7  PLT 229   Radiological Exams on Admission: Dg Ankle Complete Right  11/05/2013   CLINICAL DATA:  Inversion injury with pain in deformity to right ankle today  EXAM: RIGHT ANKLE - COMPLETE 3+ VIEW  COMPARISON:  None.  FINDINGS: There is a fracture through the medial malleolus, with the distal fracture fragment displaced medially by about 5 mm. There is an oblique mildly displaced fracture of the distal fibular metaphysis. There is diffuse moderate soft tissue swelling around the ankle. There is a joint effusion. On  the lateral view there is a tiny bone fragment seen anterior to the neck of the talus, which does not appear to arise from the talus.  IMPRESSION: Fractures of the medial and lateral malleoli.   Electronically Signed   By: Skipper Cliche M.D.   On: 11/05/2013 23:31   Dg Ankle Right Port  11/06/2013   CLINICAL DATA:  Postreduction of ankle fracture  EXAM: PORTABLE RIGHT ANKLE - 2 VIEW  COMPARISON:  Radiography from yesterday  FINDINGS: Eversion pattern distal fibula and medial malleolus fractures are re-demonstrated. There is improved alignment of the ankle status post medial reduction,  but the medial malleolus fracture remains laterally displaced. No new osseous findings.  IMPRESSION: Improved positioning of medial malleolus and distal fibula fractures.   Electronically Signed   By: Jorje Guild M.D.   On: 11/06/2013 01:55    Assessment/Plan  68 yo female with mechanical fall and right bilateral malleolar fracture  Principal Problem:   Bimalleolar fracture of right ankle-  obs for ortho to see in am.  Keep npo.  Hold any anticoagulants at this time in case can go for surgery in am.  Ck preop ekg now.  Iv pain and zofran ordered prn.  ivf.  Active Problems:   Asthma, chronic  stable   Essential hypertension, benign  stable  obs on med.  Full code.  Oda Lansdowne A 11/06/2013, 2:50 AM

## 2013-11-06 NOTE — Consult Note (Signed)
Reason for Consult:Regina Knox Referring Physician: Hospitalist  Regina Knox is an 68 y.o. female.  HPI: She fell at her home last night and hurt her right Knox.  She was seen in the ER.  X-rays showed a Regina Knox.  She had fracture reduced and casted in ER.  She will need surgical stabilization.  I have gone over the risks and imponderables of the procedure with her including infection, embolus, need for casting and physical therapy and anesthesia risks.  She asked appropriate questions and appears to understand the procedure.  Past Medical History  Diagnosis Date  . Asthma   . Hypertension   . Allergy     Past Surgical History  Procedure Laterality Date  . Breast surgery    . Tubal ligation      Family History  Problem Relation Age of Onset  . Hypertension Mother   . Drug abuse Mother   . Hypertension Father   . Heart disease Father   . Cancer Sister   . Heart disease Brother     Social History:  reports that she has never smoked. She does not have any smokeless tobacco history on file. She reports that she does not drink alcohol or use illicit drugs.  Allergies:  Allergies  Allergen Reactions  . Ibuprofen Rash    Medications: I have reviewed the patient's current medications.  Results for orders placed during the hospital encounter of 11/05/13 (from the past 48 hour(s))  CBC WITH DIFFERENTIAL     Status: Abnormal   Collection Time    11/06/13 12:35 AM      Result Value Ref Range   WBC 14.7 (*) 4.0 - 10.5 K/uL   RBC 4.27  3.87 - 5.11 MIL/uL   Hemoglobin 14.1  12.0 - 15.0 g/dL   HCT 40.0  36.0 - 46.0 %   MCV 93.7  78.0 - 100.0 fL   MCH 33.0  26.0 - 34.0 pg   MCHC 35.3  30.0 - 36.0 g/dL   RDW 13.1  11.5 - 15.5 %   Platelets 229  150 - 400 K/uL   Neutrophils Relative % 79 (*) 43 - 77 %   Neutro Abs 11.5 (*) 1.7 - 7.7 K/uL   Lymphocytes Relative 14  12 - 46 %   Lymphs Abs 2.0  0.7 - 4.0 K/uL   Monocytes Relative 6  3 - 12 %   Monocytes Absolute 0.9  0.1 - 1.0 K/uL   Eosinophils Relative 1  0 - 5 %   Eosinophils Absolute 0.2  0.0 - 0.7 K/uL   Basophils Relative 0  0 - 1 %   Basophils Absolute 0.1  0.0 - 0.1 K/uL  BASIC METABOLIC PANEL     Status: Abnormal   Collection Time    11/06/13 12:35 AM      Result Value Ref Range   Sodium 135 (*) 137 - 147 mEq/L   Potassium 3.4 (*) 3.7 - 5.3 mEq/L   Chloride 99  96 - 112 mEq/L   CO2 24  19 - 32 mEq/L   Glucose, Bld 180 (*) 70 - 99 mg/dL   BUN 19  6 - 23 mg/dL   Creatinine, Ser 0.78  0.50 - 1.10 mg/dL   Calcium 8.6  8.4 - 10.5 mg/dL   GFR calc non Af Amer 83 (*) >90 mL/min   GFR calc Af Amer >90  >90 mL/min   Comment: (NOTE)  The eGFR has been calculated using the CKD EPI equation.     This calculation has not been validated in all clinical situations.     eGFR's persistently <90 mL/min signify possible Chronic Kidney     Disease.   Anion gap 12  5 - 15    Dg Knox Complete Right  11/05/2013   CLINICAL DATA:  Inversion injury with pain in deformity to right Knox today  EXAM: RIGHT Knox - COMPLETE 3+ VIEW  COMPARISON:  None.  FINDINGS: There is a fracture through the medial malleolus, with the distal fracture fragment displaced medially by about 5 mm. There is an oblique mildly displaced fracture of the distal fibular metaphysis. There is diffuse moderate soft tissue swelling around the Knox. There is a joint effusion. On the lateral view there is a tiny bone fragment seen anterior to the neck of the talus, which does not appear to arise from the talus.  IMPRESSION: Fractures of the medial and lateral malleoli.   Electronically Signed   By: Skipper Cliche M.D.   On: 11/05/2013 23:31   Dg Knox Right Port  11/06/2013   CLINICAL DATA:  Postreduction of Knox fracture  EXAM: PORTABLE RIGHT Knox - 2 VIEW  COMPARISON:  Radiography from yesterday  FINDINGS: Eversion pattern distal fibula and medial malleolus fractures are  re-demonstrated. There is improved alignment of the Knox status post medial reduction, but the medial malleolus fracture remains laterally displaced. No new osseous findings.  IMPRESSION: Improved positioning of medial malleolus and distal fibula fractures.   Electronically Signed   By: Jorje Guild M.D.   On: 11/06/2013 01:55    Review of Systems  Cardiovascular:       Hypertension  Musculoskeletal: Positive for falls (yesterday, hurt right Knox).  All other systems reviewed and are negative.  Blood pressure 146/88, pulse 110, temperature 97.9 F (36.6 C), temperature source Oral, resp. rate 18, height _0  (1.626 m), weight 70.308 kg (155 lb), last menstrual period 05/25/1996, SpO2 97.00%. Physical Exam  Constitutional: She is oriented to person, place, and time. She appears well-developed and well-nourished.  HENT:  Head: Normocephalic and atraumatic.  Eyes: Conjunctivae and EOM are normal. Pupils are equal, round, and reactive to light.  Neck: Normal range of motion. Neck supple.  Cardiovascular: Normal rate, regular rhythm and intact distal pulses.   Respiratory: Effort normal.  GI: Soft.  Musculoskeletal: She exhibits tenderness (Pain right Knox, in a posterior splint.  NV intact.).  Neurological: She is alert and oriented to person, place, and time. She has normal reflexes.  Skin: Skin is warm and dry.  Psychiatric: She has a normal mood and affect. Her behavior is normal. Judgment and thought content normal.    Assessment/Plan: Right Regina fracture of the Knox.  For surgical fixation later today.  She will remain NPO.  Regina Knox 11/06/2013, 7:40 AM

## 2013-11-06 NOTE — Transfer of Care (Signed)
Immediate Anesthesia Transfer of Care Note  Patient: Regina Knox  Procedure(s) Performed: Procedure(s): OPEN REDUCTION INTERNAL FIXATION (ORIF) ANKLE FRACTURE (Right)  Patient Location: PACU  Anesthesia Type:Spinal  Level of Consciousness: awake, alert  and patient cooperative  Airway & Oxygen Therapy: Patient Spontanous Breathing and Patient connected to face mask oxygen  Post-op Assessment: Report given to PACU RN and Post -op Vital signs reviewed and stable  Post vital signs: Reviewed and stable  Complications: No apparent anesthesia complications

## 2013-11-06 NOTE — ED Notes (Signed)
Called pt's husband and made him aware of which room his wife was going to, room 317.

## 2013-11-06 NOTE — Anesthesia Postprocedure Evaluation (Signed)
  Anesthesia Post-op Note  Patient: Regina Knox  Procedure(s) Performed: Procedure(s): OPEN REDUCTION INTERNAL FIXATION (ORIF) ANKLE FRACTURE (Right)  Patient Location: PACU  Anesthesia Type:Spinal  Level of Consciousness: awake, alert , oriented and patient cooperative  Airway and Oxygen Therapy: Patient Spontanous Breathing  Post-op Pain: 4 /10, mild  Post-op Assessment: Post-op Vital signs reviewed, Patient's Cardiovascular Status Stable, Respiratory Function Stable, Patent Airway and Pain level controlled  Post-op Vital Signs: Reviewed and stable  Last Vitals:  Filed Vitals:   11/06/13 1453  BP:   Pulse:   Temp: 36.3 C  Resp:     Complications: No apparent anesthesia complications

## 2013-11-07 LAB — CBC WITH DIFFERENTIAL/PLATELET
Basophils Absolute: 0 10*3/uL (ref 0.0–0.1)
Basophils Relative: 0 % (ref 0–1)
Eosinophils Absolute: 0 10*3/uL (ref 0.0–0.7)
Eosinophils Relative: 0 % (ref 0–5)
HEMATOCRIT: 37.9 % (ref 36.0–46.0)
Hemoglobin: 13 g/dL (ref 12.0–15.0)
LYMPHS PCT: 8 % — AB (ref 12–46)
Lymphs Abs: 0.8 10*3/uL (ref 0.7–4.0)
MCH: 32.5 pg (ref 26.0–34.0)
MCHC: 34.3 g/dL (ref 30.0–36.0)
MCV: 94.8 fL (ref 78.0–100.0)
MONO ABS: 0.6 10*3/uL (ref 0.1–1.0)
MONOS PCT: 6 % (ref 3–12)
NEUTROS ABS: 8.8 10*3/uL — AB (ref 1.7–7.7)
Neutrophils Relative %: 86 % — ABNORMAL HIGH (ref 43–77)
Platelets: 208 10*3/uL (ref 150–400)
RBC: 4 MIL/uL (ref 3.87–5.11)
RDW: 13.1 % (ref 11.5–15.5)
WBC: 10.3 10*3/uL (ref 4.0–10.5)

## 2013-11-07 LAB — GLUCOSE, CAPILLARY
GLUCOSE-CAPILLARY: 119 mg/dL — AB (ref 70–99)
GLUCOSE-CAPILLARY: 110 mg/dL — AB (ref 70–99)
Glucose-Capillary: 126 mg/dL — ABNORMAL HIGH (ref 70–99)

## 2013-11-07 LAB — BASIC METABOLIC PANEL
Anion gap: 11 (ref 5–15)
BUN: 14 mg/dL (ref 6–23)
CALCIUM: 8.3 mg/dL — AB (ref 8.4–10.5)
CHLORIDE: 103 meq/L (ref 96–112)
CO2: 28 meq/L (ref 19–32)
CREATININE: 0.75 mg/dL (ref 0.50–1.10)
GFR calc Af Amer: >90 mL/min (ref >90)
GFR calc non Af Amer: 84 mL/min — ABNORMAL LOW (ref >90)
GLUCOSE: 120 mg/dL — AB (ref 70–99)
Potassium: 3.7 mEq/L (ref 3.7–5.3)
Sodium: 142 mEq/L (ref 137–147)

## 2013-11-07 MED ORDER — OXYCODONE-ACETAMINOPHEN 5-325 MG PO TABS
2.0000 | ORAL_TABLET | ORAL | Status: DC | PRN
  Administered 2013-11-07: 2 via ORAL
  Administered 2013-11-08: 1 via ORAL
  Filled 2013-11-07 (×3): qty 2

## 2013-11-07 NOTE — Progress Notes (Signed)
UR completed 

## 2013-11-07 NOTE — Progress Notes (Signed)
Physical Therapy Treatment Patient Details Name: Regina Knox MRN: [id number] DOB: Jun 13, 1945 Today's Date: 11/07/2013    History of Present Illness Pt is a 68 year old female who fell at home and sustained a right bimalleolar fracture.  She had OTIF on 11-06-13.  She lives with her husband who has Parkinsons and also has a fracture of some bones in his right foot.  He is able to ambulate and drive but is worried that he is going to have to undergo surgery.  They have steps at all entrances of the home, most of which have no handrail.  Pt is in otherwise good health.    PT Comments    Pt is doing well with pain well controlled.  She is able to ambulate with a walker x 25' with a stable TTWB gait.  She was instructed in gait on steps and should be able to safely enter her home.  She would benefit from HHPT to assist pt with mobility within her home.  No further acute care PT should be needed.  Follow Up Recommendations  Home health PT     Equipment Recommendations  Rolling walker with 5" wheels;Wheelchair (measurements PT)    Recommendations for Other Services  none     Precautions / Restrictions Precautions Precautions: Fall Restrictions Weight Bearing Restrictions: Yes RLE Weight Bearing: Touchdown weight bearing    Mobility  Bed Mobility Overal bed mobility: Modified Independent                Transfers Overall transfer level: Modified independent Equipment used: Rolling walker (2 wheeled)                Ambulation/Gait Ambulation/Gait assistance: Supervision Ambulation Distance (Feet): 25 Feet Assistive device: Rolling walker (2 wheeled) Gait Pattern/deviations: Decreased step length - left Gait velocity: velocity is appropriate       Stairs Stairs: Yes Stairs assistance: Min assist Stair Management: Backwards;With walker Number of Stairs: 2 General stair comments: pt is going to use an entrance with only 2 steps so we will revise  goals  Wheelchair Mobility    Modified Rankin (Stroke Patients Only)       Balance Overall balance assessment: No apparent balance deficits (not formally assessed)                                  Cognition Arousal/Alertness: Awake/alert Behavior During Therapy: WFL for tasks assessed/performed Overall Cognitive Status: Within Functional Limits for tasks assessed                      Exercises      General Comments        Pertinent Vitals/Pain Pain Assessment: No/denies pain    Home Living                      Prior Function            PT Goals (current goals can now be found in the care plan section) Progress towards PT goals: Goals met/education completed, patient discharged from PT    Frequency       PT Plan Current plan remains appropriate    Co-evaluation             End of Session Equipment Utilized During Treatment: Gait belt Activity Tolerance: Patient tolerated treatment well Patient left: in chair;with call bell/phone within reach;with family/visitor present;with nursing/sitter in  room     Time: 0413-6438 PT Time Calculation (min): 33 min  Charges:  $Gait Training: 23-37 mins                    G Codes:      Sable Feil 11/16/2013, 5:04 PM

## 2013-11-07 NOTE — Anesthesia Postprocedure Evaluation (Signed)
  Anesthesia Post-op Note  Patient: Regina Knox  Procedure(s) Performed: Procedure(s): OPEN REDUCTION INTERNAL FIXATION (ORIF) ANKLE FRACTURE (Right)  Patient Location: Room 317  Anesthesia Type:Spinal  Level of Consciousness: awake, alert , oriented and patient cooperative  Airway and Oxygen Therapy: Patient Spontanous Breathing  Post-op Pain: mild  Post-op Assessment: Post-op Vital signs reviewed, Patient's Cardiovascular Status Stable, Respiratory Function Stable, Patent Airway, NAUSEA AND VOMITING PRESENT and Pain level controlled  Post-op Vital Signs: Reviewed and stable  Last Vitals:  Filed Vitals:   11/07/13 0722  BP: 118/59  Pulse: 85  Temp: 36.7 C  Resp: 20    Complications: No apparent anesthesia complications

## 2013-11-07 NOTE — Op Note (Signed)
NAMESINDEE, STUCKER                ACCOUNT NO.:  [id number]  MEDICAL RECORD NO.:  [id number]  LOCATION:  M196                          FACILITY:  [facility]  PHYSICIAN:  J. Sanjuana Kava, M.D. DATE OF BIRTH:  08/09/1945  DATE OF PROCEDURE:  11/06/2013 DATE OF DISCHARGE:                              OPERATIVE REPORT   PREOPERATIVE DIAGNOSIS:  Bimalleolar fracture of the right ankle displaced.  POSTOPERATIVE DIAGNOSIS:  Bimalleolar fracture of the right ankle displaced.  PROCEDURE:  Open treatment and internal reduction of right bimalleolar fracture using a 6-hole plate with 6 screws laterally and a medial malleolar screw and a smooth Kirschner wire medially and application of posterior splint.  ANESTHESIA:  Spinal.  TOURNIQUET TIME:  38 minutes.  DRAINS:  None.  SURGEON:  J. Sanjuana Kava, M.D.  ASSISTANT:  Belenda Cruise Page, RN.  SPLINT:  Posterior splint applied at the end of the procedure.  INDICATIONS:  Patient fell at home last night and sustained the above- mentioned injury.  She was admitted by the hospitalist during the evening.  I saw this morning, went over the need for the procedure, and the risks and imponderables.  She appeared to understand and asked appropriate questions.  DESCRIPTION OF PROCEDURE:  The patient was seen in the holding area and the right ankle was identified as the correct surgical site.  I placed a mark.  The patient was brought back to the operating room.  She was placed supine on the operating room table after spinal anesthesia had been given.  Tourniquet was placed and deflated in right upper thigh. Sandbag placed under the hip.  The patient was prepped and draped in the usual manner.  Had a generalized time-out first for the x-ray and C-arm unit first.  Everyone had lead aprons, thyroid shields and x-ray badges.  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  We are doing ankle surgery on the  right ankle.  Everyone knew each other.  Leg was prepped and draped.  Tourniquet inflated to 250 mmHg.  Esmarch bandage removed.  Outlines for incision were made and first medial malleolar incision was made.  The fracture was identified and cleaned of hematoma and anatomically reduced with a clamp.  A smooth 0.062 Kirschner wire was placed and then a 3.2 drill bit and a 4.5, 65 mm long malleolar screw was inserted.  X-rays were taken, which showed excellent position and reduction of the fracture.  Attention directed to the lateral side.  Incision was made.  Fracture site was identified.  Held in place with a serrated clamp.  I selected a 6-hole side plate.  Drill holes were made.  It went from a 80 mm cancellous from distal to proximal 18-mm cortical and a 16-mm locking and then a 14-mm cortical screws in that order from distal to proximal.  X-rays were taken which showed reduction of the fracture.  The patient's lateral and the mortise was normal.  The wounds were then reapproximated using 3-0 chromic, 3-0 plain, and skin staples on both sides.  Sterile dressing was applied. Tourniquet deflated after 38 minutes and a posterior splint applied. ACE bandage applied.  The patient tolerated the procedure well and will to recovery in good condition.  She is admitted.          ______________________________ Lenna Sciara. Sanjuana Kava, M.D.     JWK/MEDQ  D:  11/06/2013  T:  11/07/2013  Job:  [id number]

## 2013-11-07 NOTE — Evaluation (Signed)
Physical Therapy Evaluation Patient Details Name: Regina Knox MRN: [id number] DOB: 05-Oct-1945 Today's Date: 11/07/2013   History of Present Illness  Pt is a 68 year old female who fell at home and sustained a right bimalleolar fracture.  She had OTIF on 11-06-13.  She lives with her husband who has Parkinsons and also has a fracture of some bones in his right foot.  He is able to ambulate and drive but is worried that he is going to have to undergo surgery.  They have steps at all entrances of the home, most of which have no handrail.  Pt is in otherwise good health.  Clinical Impression   Pt is very alert and cooperative.  She was instructed in gait with walker, TTWB right.  She performed well with stable gait for 15'.  We will plan on instructing in steps this afternoon as this will be her biggest challenge.  Her pain is well controlled and pt should be able to manage well at home.    Follow Up Recommendations Home health PT (I am not sure this will be needed)    Equipment Recommendations  Rolling walker with 5" wheels;Wheelchair (measurements PT) (w/c with elevating leg rest)    Recommendations for Other Services    none   Precautions / Restrictions Precautions Precautions: Fall Restrictions Weight Bearing Restrictions: Yes RLE Weight Bearing: Touchdown weight bearing      Mobility  Bed Mobility Overal bed mobility: Modified Independent                Transfers Overall transfer level: Modified independent Equipment used: Rolling walker (2 wheeled)                Ambulation/Gait Ambulation/Gait assistance: Supervision Ambulation Distance (Feet): 15 Feet Assistive device: Rolling walker (2 wheeled) Gait Pattern/deviations: Decreased step length - left   Gait velocity interpretation: Below normal speed for age/gender (appropriate for situation)    Stairs            Wheelchair Mobility    Modified Rankin (Stroke Patients Only)       Balance  Overall balance assessment: No apparent balance deficits (not formally assessed)                                           Pertinent Vitals/Pain Pain Assessment: No/denies pain    Home Living Family/patient expects to be discharged to:: Private residence Living Arrangements: Spouse/significant other Available Help at Discharge: Family;Available 24 hours/day Type of Home: House Home Access: Stairs to enter Entrance Stairs-Rails: None Entrance Stairs-Number of Steps: 3 Home Layout: One level Home Equipment: Bedside commode      Prior Function Level of Independence: Independent               Hand Dominance   Dominant Hand: Right    Extremity/Trunk Assessment               Lower Extremity Assessment: Overall WFL for tasks assessed (RLE not tested due to fracture)      Cervical / Trunk Assessment: Normal  Communication   Communication: No difficulties  Cognition Arousal/Alertness: Awake/alert Behavior During Therapy: WFL for tasks assessed/performed Overall Cognitive Status: Within Functional Limits for tasks assessed                      General Comments      Exercises  Assessment/Plan    PT Assessment Patient needs continued PT services  PT Diagnosis Difficulty walking   PT Problem List Decreased activity tolerance;Decreased mobility;Decreased knowledge of use of DME  PT Treatment Interventions DME instruction;Gait training;Stair training;Patient/family education   PT Goals (Current goals can be found in the Care Plan section) Acute Rehab PT Goals Patient Stated Goal: return home and function with min assist PT Goal Formulation: With patient/family Time For Goal Achievement: 11/14/13 Potential to Achieve Goals: Good    Frequency 7X/week   Barriers to discharge Inaccessible home environment will need to be able to manage steps    Co-evaluation               End of Session Equipment Utilized During  Treatment: Gait belt Activity Tolerance: Patient tolerated treatment well Patient left: in chair;with call bell/phone within reach;with family/visitor present Nurse Communication: Mobility status    Functional Assessment Tool Used: clinical judgement Functional Limitation: Mobility: Walking and moving around Mobility: Walking and Moving Around Current Status [phone]): At least 20 percent but less than 40 percent impaired, limited or restricted Mobility: Walking and Moving Around Goal Status [phone]): At least 1 percent but less than 20 percent impaired, limited or restricted    Time: 0816-0854 PT Time Calculation (min): 38 min   Charges:   PT Evaluation $Initial PT Evaluation Tier I: 1 Procedure PT Treatments $Gait Training: 8-22 mins   PT G Codes:   Functional Assessment Tool Used: clinical judgement Functional Limitation: Mobility: Walking and moving around    [place] 11/07/2013, 9:05 AM

## 2013-11-07 NOTE — Progress Notes (Signed)
[facility]                                                                              Patient Demographics  Regina Knox, is a 68 y.o. female, DOB - 1945-12-05, BOF:[id number]  Admit date - 11/05/2013   Admitting Physician Phillips Grout, MD  Outpatient Primary MD for the patient is Redge Gainer, MD  LOS - 2   Chief Complaint  Patient presents with  . Fall  . Ankle Pain      HPI on 11/06/2013 68 yo female with a history of asthma and hypertension, fell at home tripped over something and hurt her right ankle. No loss of consciousness. Normal state of health prior to accident. Overall healthy. No prior CAD. Very active. Has bilateral malleoli fx.   Assessment & Plan   Bimalleolar fracture of the right ankle -Patient had surgery, postop day 1 -Orthopedics consulted and following, appreciated -Continue pain management and physical therapy  Essential hypertension -Currently on no medication, will restart Benicar HCT  Chronic asthma -Currently stable, Will restart Qvar  Code Status: Full  Family Communication: Husband at bedside  Disposition Plan: Admitted, likely discharge 11/08/2013  Time Spent in minutes   30  minutes  Procedures  9/21: Open reduction, internal fixation of the right ankle  Consults   Orthopedics, Dr. Luna Glasgow  DVT Prophylaxis  Lovenox  Lab Results  Component Value Date   PLT 208 11/07/2013    Medications  Scheduled Meds: . enoxaparin (LOVENOX) injection  40 mg Subcutaneous Q24H   Continuous Infusions:  PRN Meds:.acetaminophen, albuterol, ondansetron (ZOFRAN) IV, ondansetron, oxyCODONE-acetaminophen  Antibiotics    Anti-infectives   Start     Dose/Rate Route Frequency Ordered Stop   11/06/13 1300  ceFAZolin (ANCEF) IVPB 2 g/50 mL premix  Status:  Discontinued     2 g 100 mL/hr over 30 Minutes Intravenous  Once 11/06/13 0740 11/06/13 0746   11/06/13 1300  ceFAZolin (ANCEF) IVPB 2 g/50 mL premix     2 g 100 mL/hr over 30  Minutes Intravenous  Once 11/06/13 1256 11/06/13 1325        Subjective:   [employer] seen and examined today.  Patient continues to complain of pain in her right ankle however she is ready to go home. She currently denies any shortness of breath, chest pain, dizziness, headache, abdominal pain.  Objective:   Filed Vitals:   11/06/13 1856 11/06/13 1948 11/06/13 2059 11/07/13 0722  BP: 123/97  122/67 118/59  Pulse: 118  98 85  Temp:   97.9 F (36.6 C) 98 F (36.7 C)  TempSrc:   Oral Oral  Resp: 20  20 20   Height:      Weight:      SpO2: 95% 96% 95% 95%    Wt Readings from Last 3 Encounters:  11/05/13 70.308 kg (155 lb)  11/05/13 70.308 kg (155 lb)  05/25/12 70.308 kg (155 lb)     Intake/Output Summary (Last 24 hours) at 11/07/13 1317 Last data filed at 11/07/13 0800  Gross per 24 hour  Intake   1140 ml  Output    300 ml  Net    840 ml  Exam  General: Well developed, well nourished, NAD, appears stated age  HEENT: NCAT, mucous membranes moist.   Cardiovascular: S1 S2 auscultated, no rubs, murmurs or gallops. Regular rate and rhythm.  Respiratory: Clear to auscultation bilaterally with equal chest rise  Abdomen: Soft, nontender, nondistended, + bowel sounds  Extremities: warm dry without cyanosis clubbing. Neuro LLE edema, RLE currently wrapped   Neuro: AAOx3, no focal deficit   Psych: Normal affect and demeanor with intact judgement and insight  Data Review   Micro Results Recent Results (from the past 240 hour(s))  SURGICAL PCR SCREEN     Status: None   Collection Time    11/06/13 12:16 PM      Result Value Ref Range Status   MRSA, PCR NEGATIVE  NEGATIVE Final   Staphylococcus aureus NEGATIVE  NEGATIVE Final   Comment:            The Xpert SA Assay (FDA     approved for NASAL specimens     in patients over 80 years of age),     is one component of     a comprehensive surveillance     program.  Test performance has     been validated by  [employer] for patients greater     than or equal to 43 year old.     It is not intended     to diagnose infection nor to     guide or monitor treatment.    Radiology Reports Dg Ankle 2 Views Right  11/06/2013   CLINICAL DATA:  Right ankle fracture  EXAM: RIGHT ANKLE - 2 VIEW  COMPARISON:  11/05/2013  FINDINGS: Dynamic plate fixation of the distal fibular fracture. Cannulated screw fixation of the median malleolar fracture. Fixation wire also noted in the medial malleolus. Ankle mortise intact.  IMPRESSION: ORIF bi malleolar ankle fracture without complication.   Electronically Signed   By: Suzy Bouchard M.D.   On: 11/06/2013 14:46   Dg Ankle Complete Right  11/05/2013   CLINICAL DATA:  Inversion injury with pain in deformity to right ankle today  EXAM: RIGHT ANKLE - COMPLETE 3+ VIEW  COMPARISON:  None.  FINDINGS: There is a fracture through the medial malleolus, with the distal fracture fragment displaced medially by about 5 mm. There is an oblique mildly displaced fracture of the distal fibular metaphysis. There is diffuse moderate soft tissue swelling around the ankle. There is a joint effusion. On the lateral view there is a tiny bone fragment seen anterior to the neck of the talus, which does not appear to arise from the talus.  IMPRESSION: Fractures of the medial and lateral malleoli.   Electronically Signed   By: Skipper Cliche M.D.   On: 11/05/2013 23:31   Dg Ankle Right Port  11/06/2013   CLINICAL DATA:  Postreduction of ankle fracture  EXAM: PORTABLE RIGHT ANKLE - 2 VIEW  COMPARISON:  Radiography from yesterday  FINDINGS: Eversion pattern distal fibula and medial malleolus fractures are re-demonstrated. There is improved alignment of the ankle status post medial reduction, but the medial malleolus fracture remains laterally displaced. No new osseous findings.  IMPRESSION: Improved positioning of medial malleolus and distal fibula fractures.   Electronically Signed   By: Jorje Guild M.D.   On: 11/06/2013 01:55   Dg C-arm 1-60 Min  11/06/2013   CLINICAL DATA:  ORIF right ankle fracture  EXAM: DG C-ARM 61-120 MIN  FLUOROSCOPY TIME:  17 seconds  COMPARISON:  11/06/2015  FINDINGS: Single twisted 2 spot intraoperative views of the right ankle demonstrate internal dominant and endplate fixation of the distal fibular fracture. Cannulated screw more fixation medial malleolar fracture. Ankle mortise intact.  IMPRESSION: ORIF right ankle fracture without complication   Electronically Signed   By: Suzy Bouchard M.D.   On: 11/06/2013 14:49    CBC  Recent Labs Lab 11/06/13 0035 11/07/13 0506  WBC 14.7* 10.3  HGB 14.1 13.0  HCT 40.0 37.9  PLT 229 208  MCV 93.7 94.8  MCH 33.0 32.5  MCHC 35.3 34.3  RDW 13.1 13.1  LYMPHSABS 2.0 0.8  MONOABS 0.9 0.6  EOSABS 0.2 0.0  BASOSABS 0.1 0.0    Chemistries   Recent Labs Lab 11/06/13 0035 11/07/13 0506  NA 135* 142  K 3.4* 3.7  CL 99 103  CO2 24 28  GLUCOSE 180* 120*  BUN 19 14  CREATININE 0.78 0.75  CALCIUM 8.6 8.3*   ------------------------------------------------------------------------------------------------------------------ estimated creatinine clearance is 62.9 ml/min (by C-G formula based on Cr of 0.75). ------------------------------------------------------------------------------------------------------------------  Recent Labs  11/06/13 0330  HGBA1C 6.2*   ------------------------------------------------------------------------------------------------------------------ No results found for this basename: CHOL, HDL, LDLCALC, TRIG, CHOLHDL, LDLDIRECT,  in the last 72 hours ------------------------------------------------------------------------------------------------------------------ No results found for this basename: TSH, T4TOTAL, FREET3, T3FREE, THYROIDAB,  in the last 72 hours ------------------------------------------------------------------------------------------------------------------ No  results found for this basename: VITAMINB12, FOLATE, FERRITIN, TIBC, IRON, RETICCTPCT,  in the last 72 hours  Coagulation profile No results found for this basename: INR, PROTIME,  in the last 168 hours  No results found for this basename: DDIMER,  in the last 72 hours  Cardiac Enzymes No results found for this basename: CK, CKMB, TROPONINI, MYOGLOBIN,  in the last 168 hours ------------------------------------------------------------------------------------------------------------------ No components found with this basename: POCBNP,     Ekam Bonebrake D.O. on 11/07/2013 at 1:17 PM  Between 7am to 7pm - Pager - [phone]  After 7pm go to [email or web address] - password [email or web address]  And look for the night coverage person covering for me after hours  [facility] Office  [phone]

## 2013-11-07 NOTE — Care Management Note (Signed)
    Page 1 of 1   11/07/2013     12:06:39 PM CARE MANAGEMENT NOTE 11/07/2013  Patient:  Regina Knox, Regina Knox   Account Number:  [id number]  Date Initiated:  11/07/2013  Documentation initiated by:  Theophilus Kinds  Subjective/Objective Assessment:   Pt admitted from home s/p ankle fracture and ORIF. Pt lives with her husband and will return home at discharge. Pt has been independent with ADL's.     Action/Plan:   Pt stated she will be able to get a rollator and 3N1 from a family friend. Pt is interested in a w/c for rent. Pt also chose [facility] for PT. Will continue to follow for discharge planning needs.   Anticipated DC Date:  11/08/2013   Anticipated DC Plan:  [place]  CM consult      [facility] Choice  HOME HEALTH  DURABLE MEDICAL EQUIPMENT   Choice offered to / List presented to:  C-1 Patient   DME arranged  [place] arranged  [place].   Status of service:  Completed, signed off Medicare Important Message given?   (If response is "NO", the following Medicare IM given date fields will be blank) Date Medicare IM given:   Medicare IM given by:   Date Additional Medicare IM given:   Additional Medicare IM given by:    Discharge Disposition:  [place]  Per UR Regulation:    If discussed at Long Length of Stay Meetings, dates discussed:    Comments:  11/07/13 [place], RN BSN CM

## 2013-11-07 NOTE — Progress Notes (Signed)
Subjective: 1 Day Post-Op Procedure(s) (LRB): OPEN REDUCTION INTERNAL FIXATION (ORIF) ANKLE FRACTURE (Right) Patient reports pain as 4 on 0-10 scale.    Objective: Vital signs in last 24 hours: Temp:  [97.3 F (36.3 C)-98.3 F (36.8 C)] 98 F (36.7 C) (09/22 0722) Pulse Rate:  [85-118] 85 (09/22 0722) Resp:  [12-22] 20 (09/22 0722) BP: (112-140)/(57-97) 118/59 mmHg (09/22 0722) SpO2:  [89 %-100 %] 95 % (09/22 0722)  Intake/Output from previous day: 09/21 0701 - 09/22 0700 In: 900 [P.O.:300; I.V.:600] Out: 300 [Urine:300] Intake/Output this shift:     Recent Labs  11/06/13 0035 11/07/13 0506  HGB 14.1 13.0    Recent Labs  11/06/13 0035 11/07/13 0506  WBC 14.7* 10.3  RBC 4.27 4.00  HCT 40.0 37.9  PLT 229 208    Recent Labs  11/06/13 0035 11/07/13 0506  NA 135* 142  K 3.4* 3.7  CL 99 103  CO2 24 28  BUN 19 14  CREATININE 0.78 0.75  GLUCOSE 180* 120*  CALCIUM 8.6 8.3*   No results found for this basename: LABPT, INR,  in the last 72 hours  Neurologically intact Neurovascular intact Sensation intact distally Intact pulses distally  She has had nausea and vomiting with her pain medicine IV.  I will order oral Percocet 5 for her to try.  She had a good night.  Begin PT today.  Consider discharge tomorrow.  She will need home health at discharge.   Assessment/Plan: 1 Day Post-Op Procedure(s) (LRB): OPEN REDUCTION INTERNAL FIXATION (ORIF) ANKLE FRACTURE (Right) Up with therapy  Regina Knox 11/07/2013, 7:28 AM

## 2013-11-07 NOTE — Addendum Note (Signed)
Addendum created 11/07/13 0817 by Mickel Baas, CRNA   Modules edited: Notes Section   Notes Section:  File: [id number]

## 2013-11-08 ENCOUNTER — Encounter (HOSPITAL_COMMUNITY): Payer: Self-pay | Admitting: Orthopaedic Surgery

## 2013-11-08 LAB — CBC WITH DIFFERENTIAL/PLATELET
Basophils Absolute: 0 10*3/uL (ref 0.0–0.1)
Basophils Relative: 0 % (ref 0–1)
EOS PCT: 4 % (ref 0–5)
Eosinophils Absolute: 0.3 10*3/uL (ref 0.0–0.7)
HEMATOCRIT: 36.5 % (ref 36.0–46.0)
Hemoglobin: 12.5 g/dL (ref 12.0–15.0)
Lymphocytes Relative: 29 % (ref 12–46)
Lymphs Abs: 2 10*3/uL (ref 0.7–4.0)
MCH: 32.8 pg (ref 26.0–34.0)
MCHC: 34.2 g/dL (ref 30.0–36.0)
MCV: 95.8 fL (ref 78.0–100.0)
MONO ABS: 0.6 10*3/uL (ref 0.1–1.0)
Monocytes Relative: 9 % (ref 3–12)
Neutro Abs: 4 10*3/uL (ref 1.7–7.7)
Neutrophils Relative %: 58 % (ref 43–77)
Platelets: 197 10*3/uL (ref 150–400)
RBC: 3.81 MIL/uL — ABNORMAL LOW (ref 3.87–5.11)
RDW: 13.6 % (ref 11.5–15.5)
WBC: 7 10*3/uL (ref 4.0–10.5)

## 2013-11-08 LAB — BASIC METABOLIC PANEL
Anion gap: 8 (ref 5–15)
BUN: 17 mg/dL (ref 6–23)
CALCIUM: 8.1 mg/dL — AB (ref 8.4–10.5)
CO2: 30 mEq/L (ref 19–32)
CREATININE: 0.79 mg/dL (ref 0.50–1.10)
Chloride: 105 mEq/L (ref 96–112)
GFR calc Af Amer: >90 mL/min (ref >90)
GFR calc non Af Amer: 82 mL/min — ABNORMAL LOW (ref >90)
GLUCOSE: 95 mg/dL (ref 70–99)
Potassium: 3.5 mEq/L — ABNORMAL LOW (ref 3.7–5.3)
Sodium: 143 mEq/L (ref 137–147)

## 2013-11-08 LAB — GLUCOSE, CAPILLARY: Glucose-Capillary: 98 mg/dL (ref 70–99)

## 2013-11-08 MED ORDER — FLUTICASONE PROPIONATE 50 MCG/ACT NA SUSP: 2.0000 | Freq: Every day | NASAL | Status: DC

## 2013-11-08 MED ORDER — OLMESARTAN MEDOXOMIL-HCTZ 40-25 MG PO TABS
1.0000 | ORAL_TABLET | Freq: Every day | ORAL | Status: DC
Start: 2013-11-08 — End: 2014-01-31

## 2013-11-08 MED ORDER — BECLOMETHASONE DIPROPIONATE 80 MCG/ACT IN AERS: 2.0000 | INHALATION_SPRAY | Freq: Two times a day (BID) | RESPIRATORY_TRACT | Status: DC

## 2013-11-08 MED ORDER — POTASSIUM CHLORIDE CRYS ER 20 MEQ PO TBCR
40.0000 meq | EXTENDED_RELEASE_TABLET | Freq: Once | ORAL | Status: AC
  Administered 2013-11-08: 40 meq via ORAL
  Filled 2013-11-08: qty 2

## 2013-11-08 MED ORDER — OXYCODONE-ACETAMINOPHEN 5-325 MG PO TABS: 2.0000 | ORAL_TABLET | ORAL | Status: DC | PRN

## 2013-11-08 NOTE — Progress Notes (Signed)
Subjective: 2 Days Post-Op Procedure(s) (LRB): OPEN REDUCTION INTERNAL FIXATION (ORIF) ANKLE FRACTURE (Right) Patient reports pain as 2 on 0-10 scale.    Objective: Vital signs in last 24 hours: Temp:  [97.8 F (36.6 C)-98.7 F (37.1 C)] 97.8 F (36.6 C) (09/23 0644) Pulse Rate:  [80-104] 80 (09/23 0644) Resp:  [20] 20 (09/23 0644) BP: (110-132)/(58-75) 123/75 mmHg (09/23 0644) SpO2:  [95 %-96 %] 96 % (09/23 0644)  Intake/Output from previous day: 09/22 0701 - 09/23 0700 In: 720 [P.O.:720] Out: -  Intake/Output this shift:     Recent Labs  11/06/13 0035 11/07/13 0506 11/08/13 0512  HGB 14.1 13.0 12.5    Recent Labs  11/07/13 0506 11/08/13 0512  WBC 10.3 7.0  RBC 4.00 3.81*  HCT 37.9 36.5  PLT 208 197    Recent Labs  11/07/13 0506 11/08/13 0512  NA 142 143  K 3.7 3.5*  CL 103 105  CO2 28 30  BUN 14 17  CREATININE 0.75 0.79  GLUCOSE 120* 95  CALCIUM 8.3* 8.1*   No results found for this basename: LABPT, INR,  in the last 72 hours  Neurologically intact Neurovascular intact Sensation intact distally Intact pulses distally  She did well in therapy.  She has little pain.  I went over plans after she gets home.  Assessment/Plan: 2 Days Post-Op Procedure(s) (LRB): OPEN REDUCTION INTERNAL FIXATION (ORIF) ANKLE FRACTURE (Right) Discharge home with home health  I will need to see her in my office in two weeks.  Call [phone] for appointment.  Elevate right foot and ankle.  Use walker with toe touch only on the right.  Keep cast dry and intact.  Call office if any problem or hospital at [phone] if after hours.  Comer Devins 11/08/2013, 7:49 AM

## 2013-11-08 NOTE — Discharge Instructions (Signed)
Ankle Fracture  A fracture is a break in a bone. The ankle joint is made up of three bones. These include the lower (distal)sections of your lower leg bones, called the tibia and fibula, along with a bone in your foot, called the talus. Depending on how bad the break is and if more than one ankle joint bone is broken, a cast or splint is used to protect and keep your injured bone from moving while it heals. Sometimes, surgery is required to help the fracture heal properly.   There are two general types of fractures:   Stable fracture. This includes a single fracture line through one bone, with no injury to ankle ligaments. A fracture of the talus that does not have any displacement (movement of the bone on either side of the fracture line) is also stable.   Unstable fracture. This includes more than one fracture line through one or more bones in the ankle joint. It also includes fractures that have displacement of the bone on either side of the fracture line.  CAUSES   A direct blow to the ankle.    Quickly and severely twisting your ankle.   Trauma, such as a car accident or falling from a significant height.  RISK FACTORS  You may be at a higher risk of ankle fracture if:   You have certain medical conditions.   You are involved in high-impact sports.   You are involved in a high-impact car accident.  SIGNS AND SYMPTOMS    Tender and swollen ankle.   Bruising around the injured ankle.   Pain on movement of the ankle.   Difficulty walking or putting weight on the ankle.   A cold foot below the site of the ankle injury. This can occur if the blood vessels passing through your injured ankle were also damaged.   Numbness in the foot below the site of the ankle injury.  DIAGNOSIS   An ankle fracture is usually diagnosed with a physical exam and X-rays. A CT scan may also be required for complex fractures.  TREATMENT   Stable fractures are treated with a cast or splint and using crutches to avoid putting  weight on your injured ankle. This is followed by an ankle strengthening program. Some patients require a special type of cast, depending on other medical problems they may have. Unstable fractures require surgery to ensure the bones heal properly. Your health care provider will tell you what type of fracture you have and the best treatment for your condition.  HOME CARE INSTRUCTIONS    Review correct crutch use with your health care provider and use your crutches as directed. Safe use of crutches is extremely important. Misuse of crutches can cause you to fall or cause injury to nerves in your hands or armpits.   Do not put weight or pressure on the injured ankle until directed by your health care provider.   To lessen the swelling, keep the injured leg elevated while sitting or lying down.   Apply ice to the injured area:   Put ice in a plastic bag.   Place a towel between your cast and the bag.   Leave the ice on for 20 minutes, 2-3 times a day.   If you have a plaster or fiberglass cast:   Do not try to scratch the skin under the cast with any objects. This can increase your risk of skin infection.   Check the skin around the cast every day. You   may put lotion on any red or sore areas.   Keep your cast dry and clean.   If you have a plaster splint:   Wear the splint as directed.   You may loosen the elastic around the splint if your toes become numb, tingle, or turn cold or blue.   Do not put pressure on any part of your cast or splint; it may break. Rest your cast only on a pillow the first 24 hours until it is fully hardened.   Your cast or splint can be protected during bathing with a plastic bag sealed to your skin with medical tape. Do not lower the cast or splint into water.   Take medicines as directed by your health care provider. Only take over-the-counter or prescription medicines for pain, discomfort, or fever as directed by your health care provider.   Do not drive a vehicle until  your health care provider specifically tells you it is safe to do so.   If your health care provider has given you a follow-up appointment, it is very important to keep that appointment. Not keeping the appointment could result in a chronic or permanent injury, pain, and disability. If you have any problem keeping the appointment, call the facility for assistance.  SEEK MEDICAL CARE IF:  You develop increased swelling or discomfort.  SEEK IMMEDIATE MEDICAL CARE IF:    Your cast gets damaged or breaks.   You have continued severe pain.   You develop new pain or swelling after the cast was put on.   Your skin or toenails below the injury turn blue or gray.   Your skin or toenails below the injury feel cold, numb, or have loss of sensitivity to touch.   There is a bad smell or pus draining from under the cast.  MAKE SURE YOU:    Understand these instructions.   Will watch your condition.   Will get help right away if you are not doing well or get worse.  Document Released: 01/31/2000 Document Revised: 02/07/2013 Document Reviewed: 09/01/2012  ExitCare Patient Information 2015 [employer]. This information is not intended to replace advice given to you by your health care provider. Make sure you discuss any questions you have with your health care provider.

## 2013-11-08 NOTE — Discharge Summary (Signed)
Physician Discharge Summary  Regina Knox:[id number] DOB: October 24, 1945 DOA: 11/05/2013  PCP: Redge Gainer, MD  Admit date: 11/05/2013 Discharge date: 11/08/2013  Time spent: 45 minutes  Recommendations for Outpatient Follow-up:  Patient will be discharged home with home health physical therapy. She is to follow up with her primary care physician within one week of discharge, as well as followup with Dr. Luna Glasgow within 2 weeks of discharge.  Patient should keep her cast dry and intact.  She is to use a walker with only toe touch (on the right).  She is also to keep her right foot and ankle elevated.  Patient to continue her medications as prescribed. She should follow a heart healthy diet.   Discharge Diagnoses:  Principal Problem:   Bimalleolar fracture of right ankle Active Problems:   Asthma, chronic   Essential hypertension, benign   Discharge Condition: Stable  Diet recommendation: Heart healthy  Filed Weights   11/05/13 2234  Weight: 70.308 kg (155 lb)    History of present illness:  on 11/06/2013  68 yo female with a history of asthma and hypertension, fell at home tripped over something and hurt her right ankle. No loss of consciousness. Normal state of health prior to accident. Overall healthy. No prior CAD. Very active. Has bilateral malleoli fx.  Hospital Course:  Bimalleolar fracture of the right ankle  -Patient had surgery, postop day 2 -Orthopedics consulted and following, appreciated  -Continue pain management and physical therapy  -PT recommended home health -followup with Dr. Luna Glasgow within 2 weeks of discharge.  Patient should keep her cast dry and intact.  She is to use a walker with only toe touch (on the right).  She is also to keep her right foot and ankle elevated.   Essential hypertension  -Currently on no medication, Continue Benicar HCT  -Refill given  Chronic asthma  -Currently stable, Continue Qvar  -Refill given  Procedures  9/21: Open  reduction, internal fixation of the right ankle   Consults  Orthopedics, Dr. Luna Glasgow  Discharge Exam: Filed Vitals:   11/08/13 0644  BP: 123/75  Pulse: 80  Temp: 97.8 F (36.6 C)  Resp: 20   Exam  General: Well developed, well nourished, NAD, appears stated age  HEENT: NCAT, mucous membranes moist.  Cardiovascular: S1 S2 auscultated, no rubs, murmurs or gallops. Regular rate and rhythm.  Respiratory: Clear to auscultation bilaterally with equal chest rise  Abdomen: Soft, nontender, nondistended, + bowel sounds  Extremities: warm dry without cyanosis clubbing. Neuro LLE edema, RLE currently wrapped  Neuro: AAOx3, no focal deficit  Psych: Normal affect and demeanor with intact judgement and insight  Discharge Instructions      Discharge Instructions   Discharge instructions    Complete by:  As directed   Patient will be discharged home with home health physical therapy. She is to follow up with her primary care physician within one week of discharge, as well as followup with Dr. Luna Glasgow within 2 weeks of discharge.  Patient should keep her cast dry and intact.  She is to use a walker with only toe touch (on the right).  She is also to keep her right foot and ankle elevated.  Patient to continue her medications as prescribed. She should follow a heart healthy diet.            Medication List         beclomethasone 80 MCG/ACT inhaler  Commonly known as:  QVAR  Inhale 2 puffs into  the lungs 2 (two) times daily.     fluticasone 50 MCG/ACT nasal spray  Commonly known as:  FLONASE  Place 2 sprays into both nostrils daily.     olmesartan-hydrochlorothiazide 40-25 MG per tablet  Commonly known as:  BENICAR HCT  Take 1 tablet by mouth daily.     oxyCODONE-acetaminophen 5-325 MG per tablet  Commonly known as:  PERCOCET/ROXICET  Take 2 tablets by mouth every 4 (four) hours as needed for moderate pain.       Allergies  Allergen Reactions  . Ibuprofen Rash   Follow-up  Information   Follow up with [facility].   Contact information:   [address] [place] [address] [phone]       Schedule an appointment as soon as possible for a visit with Sanjuana Kava, MD. [facility] followup)    Specialty:  Orthopedic Surgery   Contact information:   [place] [address] [phone]       Follow up with Redge Gainer, MD. Schedule an appointment as soon as possible for a visit in 1 week. [facility] followup)    Specialty:  Family Medicine   Contact information:   [place] [address] [phone]        The results of significant diagnostics from this hospitalization (including imaging, microbiology, ancillary and laboratory) are listed below for reference.    Significant Diagnostic Studies: Dg Ankle 2 Views Right  11-11-2013   CLINICAL DATA:  Right ankle fracture  EXAM: RIGHT ANKLE - 2 VIEW  COMPARISON:  11/05/2013  FINDINGS: Dynamic plate fixation of the distal fibular fracture. Cannulated screw fixation of the median malleolar fracture. Fixation wire also noted in the medial malleolus. Ankle mortise intact.  IMPRESSION: ORIF bi malleolar ankle fracture without complication.   Electronically Signed   By: Suzy Bouchard M.D.   On: 11-11-13 14:46   Dg Ankle Complete Right  11/05/2013   CLINICAL DATA:  Inversion injury with pain in deformity to right ankle today  EXAM: RIGHT ANKLE - COMPLETE 3+ VIEW  COMPARISON:  None.  FINDINGS: There is a fracture through the medial malleolus, with the distal fracture fragment displaced medially by about 5 mm. There is an oblique mildly displaced fracture of the distal fibular metaphysis. There is diffuse moderate soft tissue swelling around the ankle. There is a joint effusion. On the lateral view there is a tiny bone fragment seen anterior to the neck of the talus, which does not appear to arise from the talus.  IMPRESSION: Fractures of the medial and  lateral malleoli.   Electronically Signed   By: Skipper Cliche M.D.   On: 11/05/2013 23:31   Dg Ankle Right Port  11-Nov-2013   CLINICAL DATA:  Postreduction of ankle fracture  EXAM: PORTABLE RIGHT ANKLE - 2 VIEW  COMPARISON:  Radiography from yesterday  FINDINGS: Eversion pattern distal fibula and medial malleolus fractures are re-demonstrated. There is improved alignment of the ankle status post medial reduction, but the medial malleolus fracture remains laterally displaced. No new osseous findings.  IMPRESSION: Improved positioning of medial malleolus and distal fibula fractures.   Electronically Signed   By: Jorje Guild M.D.   On: 11-Nov-2013 01:55   Dg C-arm 1-60 Min  2013/11/11   CLINICAL DATA:  ORIF right ankle fracture  EXAM: DG C-ARM 61-120 MIN  FLUOROSCOPY TIME:  17 seconds  COMPARISON:  11/06/2015  FINDINGS: Single twisted 2 spot intraoperative views of the right ankle  demonstrate internal dominant and endplate fixation of the distal fibular fracture. Cannulated screw more fixation medial malleolar fracture. Ankle mortise intact.  IMPRESSION: ORIF right ankle fracture without complication   Electronically Signed   By: Suzy Bouchard M.D.   On: 11/06/2013 14:49    Microbiology: Recent Results (from the past 240 hour(s))  SURGICAL PCR SCREEN     Status: None   Collection Time    11/06/13 12:16 PM      Result Value Ref Range Status   MRSA, PCR NEGATIVE  NEGATIVE Final   Staphylococcus aureus NEGATIVE  NEGATIVE Final   Comment:            The Xpert SA Assay (FDA     approved for NASAL specimens     in patients over 69 years of age),     is one component of     a comprehensive surveillance     program.  Test performance has     been validated by [employer] for patients greater     than or equal to 44 year old.     It is not intended     to diagnose infection nor to     guide or monitor treatment.     Labs: Basic Metabolic Panel:  Recent Labs Lab 11/06/13 0035  11/07/13 0506 11/08/13 0512  NA 135* 142 143  K 3.4* 3.7 3.5*  CL 99 103 105  CO2 24 28 30   GLUCOSE 180* 120* 95  BUN 19 14 17   CREATININE 0.78 0.75 0.79  CALCIUM 8.6 8.3* 8.1*   Liver Function Tests: No results found for this basename: AST, ALT, ALKPHOS, BILITOT, PROT, ALBUMIN,  in the last 168 hours No results found for this basename: LIPASE, AMYLASE,  in the last 168 hours No results found for this basename: AMMONIA,  in the last 168 hours CBC:  Recent Labs Lab 11/06/13 0035 11/07/13 0506 11/08/13 0512  WBC 14.7* 10.3 7.0  NEUTROABS 11.5* 8.8* 4.0  HGB 14.1 13.0 12.5  HCT 40.0 37.9 36.5  MCV 93.7 94.8 95.8  PLT 229 208 197   Cardiac Enzymes: No results found for this basename: CKTOTAL, CKMB, CKMBINDEX, TROPONINI,  in the last 168 hours BNP: BNP (last 3 results) No results found for this basename: PROBNP,  in the last 8760 hours CBG:  Recent Labs Lab 11/06/13 2108 11/07/13 0738 11/07/13 1116 11/07/13 1652 11/08/13 0729  GLUCAP 185* 119* 110* 126* 98       Signed:  Alandria Butkiewicz  [facility] 11/08/2013, 9:14 AM

## 2013-11-08 NOTE — Progress Notes (Signed)
Discharge teaching and education completed.  IV removed intact w/o difficulty, 2x2 with tape placed.  All questions answered.  Pt taken with belongings down to main entrance to meet husband with car.

## 2013-11-15 ENCOUNTER — Ambulatory Visit: Payer: Medicare Other | Admitting: Family

## 2014-01-31 ENCOUNTER — Telehealth: Payer: Self-pay | Admitting: *Deleted

## 2014-01-31 ENCOUNTER — Ambulatory Visit (INDEPENDENT_AMBULATORY_CARE_PROVIDER_SITE_OTHER): Payer: Medicare Other | Admitting: Nurse Practitioner

## 2014-01-31 ENCOUNTER — Encounter: Payer: Self-pay | Admitting: Nurse Practitioner

## 2014-01-31 VITALS — BP 122/75 | HR 82 | Temp 97.3°F | Ht 64.0 in | Wt 150.0 lb

## 2014-01-31 DIAGNOSIS — I10 Essential (primary) hypertension: Secondary | ICD-10-CM

## 2014-01-31 DIAGNOSIS — J4520 Mild intermittent asthma, uncomplicated: Secondary | ICD-10-CM

## 2014-01-31 DIAGNOSIS — J309 Allergic rhinitis, unspecified: Secondary | ICD-10-CM

## 2014-01-31 DIAGNOSIS — Z1212 Encounter for screening for malignant neoplasm of rectum: Secondary | ICD-10-CM

## 2014-01-31 MED ORDER — BECLOMETHASONE DIPROPIONATE 80 MCG/ACT IN AERS: 2.0000 | INHALATION_SPRAY | Freq: Two times a day (BID) | RESPIRATORY_TRACT | Status: DC

## 2014-01-31 MED ORDER — OLMESARTAN MEDOXOMIL-HCTZ 40-25 MG PO TABS: 1.0000 | ORAL_TABLET | Freq: Every day | ORAL | Status: DC

## 2014-01-31 MED ORDER — FLUTICASONE PROPIONATE 50 MCG/ACT NA SUSP: 2.0000 | Freq: Every day | NASAL | Status: DC

## 2014-01-31 NOTE — Telephone Encounter (Signed)
Called Mrs Ellenberger to come back in to have labs drawn ordered on 01-31-14

## 2014-01-31 NOTE — Progress Notes (Signed)
Subjective:    Patient ID: Regina Knox, female    DOB: Apr 04, 1945, 68 y.o.   MRN: [id number]  HPI Patient here today for follow up of chronic medical problems.  Hypertension This is a chronic problem. The current episode started more than 1 year ago. The problem has been resolved since onset. Pertinent negatives include no blurred vision, chest pain, headaches, neck pain, palpitations, peripheral edema or shortness of breath. There are no associated agents to hypertension. Risk factors for coronary artery disease include family history and post-menopausal state. Past treatments include angiotensin blockers and diuretics. The current treatment provides significant improvement. Compliance problems include exercise and diet.  There is no history of kidney disease, heart failure or a thyroid problem. There is no history of chronic renal disease or sleep apnea.  Asthma She complains of frequent throat clearing. There is no chest tightness, cough, difficulty breathing or shortness of breath. This is a chronic problem. The current episode started more than 1 year ago. The problem occurs rarely. The problem has been gradually improving. Associated symptoms include nasal congestion, postnasal drip and sneezing. Pertinent negatives include no chest pain, ear congestion, ear pain, fever, headaches, heartburn, sore throat or trouble swallowing. Her symptoms are aggravated by nothing. Her symptoms are alleviated by rest. She reports moderate improvement on treatment. There are no known risk factors for lung disease. Her past medical history is significant for asthma. There is no history of COPD, emphysema or pneumonia.      Review of Systems  Constitutional: Negative.   HENT: Negative.   Respiratory: Negative.   Cardiovascular: Negative.   Genitourinary: Negative.   Neurological: Negative.   Psychiatric/Behavioral: Negative.   All other systems reviewed and are negative.      Objective:   Physical  Exam  Constitutional: She is oriented to person, place, and time. She appears well-developed and well-nourished.  HENT:  Nose: Nose normal.  Mouth/Throat: Oropharynx is clear and moist.  Eyes: EOM are normal.  Neck: Trachea normal, normal range of motion and full passive range of motion without pain. Neck supple. No JVD present. Carotid bruit is not present. No thyromegaly present.  Cardiovascular: Normal rate, regular rhythm, normal heart sounds and intact distal pulses.  Exam reveals no gallop and no friction rub.   No murmur heard. Pulmonary/Chest: Effort normal and breath sounds normal.  Abdominal: Soft. Bowel sounds are normal. She exhibits no distension and no mass. There is no tenderness.  Musculoskeletal: Normal range of motion.  Lymphadenopathy:    She has no cervical adenopathy.  Neurological: She is alert and oriented to person, place, and time. She has normal reflexes.  Skin: Skin is warm and dry.  Psychiatric: She has a normal mood and affect. Her behavior is normal. Judgment and thought content normal.    BP 122/75 mmHg  Pulse 82  Temp(Src) 97.3 F (36.3 C) (Oral)  Ht '5\' 4"'  (1.626 m)  Wt 150 lb (68.04 kg)  BMI 25.73 kg/m2  LMP 05/25/1996       Assessment & Plan:  1. Allergic rhinitis, unspecified allergic rhinitis type - fluticasone (FLONASE) 50 MCG/ACT nasal spray; Place 2 sprays into both nostrils daily.  Dispense: 1 g; Refill: 0  2. Asthma, chronic, mild intermittent, uncomplicated - beclomethasone (QVAR) 80 MCG/ACT inhaler; Inhale 2 puffs into the lungs 2 (two) times daily.  Dispense: 1 Inhaler; Refill: 0  3. Essential hypertension, benign Do not add salt to diet - CMP14+EGFR - NMR, lipoprofile - olmesartan-hydrochlorothiazide (BENICAR HCT)  40-25 MG per tablet; Take 1 tablet by mouth daily.  Dispense: 30 tablet; Refill  hemoccult cards given to patient with directions Rrefuses all health maintenance Labs pending Health maintenance reviewed Diet and  exercise encouraged Continue all meds Follow up  In 3 month   [place], FNP

## 2014-01-31 NOTE — Patient Instructions (Signed)

## 2014-02-01 LAB — CMP14+EGFR
A/G RATIO: 1.8 (ref 1.1–2.5)
ALK PHOS: 76 IU/L (ref 39–117)
ALT: 16 IU/L (ref 0–32)
AST: 19 IU/L (ref 0–40)
Albumin: 4.2 g/dL (ref 3.5–4.8)
BUN / CREAT RATIO: 20 (ref 11–26)
BUN: 16 mg/dL (ref 8–27)
CO2: 29 mmol/L (ref 18–29)
CREATININE: 0.8 mg/dL (ref 0.57–1.00)
Calcium: 9.3 mg/dL (ref 8.7–10.3)
Chloride: 101 mmol/L (ref 97–108)
GFR calc Af Amer: 86 mL/min/{1.73_m2} (ref >59)
GFR calc non Af Amer: 75 mL/min/{1.73_m2} (ref >59)
GLOBULIN, TOTAL: 2.3 g/dL (ref 1.5–4.5)
Glucose: 107 mg/dL — ABNORMAL HIGH (ref 65–99)
Potassium: 4.1 mmol/L (ref 3.5–5.2)
SODIUM: 142 mmol/L (ref 134–144)
Total Bilirubin: 0.3 mg/dL (ref 0.0–1.2)
Total Protein: 6.5 g/dL (ref 6.0–8.5)

## 2014-02-01 LAB — NMR, LIPOPROFILE
Cholesterol: 201 mg/dL — ABNORMAL HIGH (ref 100–199)
HDL Cholesterol by NMR: 55 mg/dL (ref >39)
HDL PARTICLE NUMBER: 35.4 umol/L (ref >=30.5)
LDL Particle Number: 1423 nmol/L — ABNORMAL HIGH (ref <1000)
LDL SIZE: 21.5 nm (ref >20.5)
LDL-C: 118 mg/dL — ABNORMAL HIGH (ref 0–99)
LP-IR SCORE: 47 — AB (ref <=45)
Small LDL Particle Number: 475 nmol/L (ref <=527)
Triglycerides by NMR: 140 mg/dL (ref 0–149)

## 2014-02-05 NOTE — Addendum Note (Signed)
Addended by: Selmer Dominion on: 02/05/2014 09:05 AM   Modules accepted: Orders

## 2014-02-07 LAB — FECAL OCCULT BLOOD, IMMUNOCHEMICAL: FECAL OCCULT BLD: POSITIVE — AB

## 2014-02-26 ENCOUNTER — Other Ambulatory Visit: Payer: PPO

## 2014-02-26 DIAGNOSIS — Z1212 Encounter for screening for malignant neoplasm of rectum: Secondary | ICD-10-CM

## 2014-02-26 NOTE — Progress Notes (Signed)
Lab only 

## 2014-02-28 LAB — FECAL OCCULT BLOOD, IMMUNOCHEMICAL: Fecal Occult Bld: NEGATIVE

## 2014-07-23 ENCOUNTER — Other Ambulatory Visit: Payer: Self-pay | Admitting: Nurse Practitioner

## 2014-08-13 ENCOUNTER — Other Ambulatory Visit: Payer: Self-pay

## 2014-11-27 ENCOUNTER — Ambulatory Visit: Payer: PPO

## 2014-11-29 ENCOUNTER — Ambulatory Visit (INDEPENDENT_AMBULATORY_CARE_PROVIDER_SITE_OTHER): Payer: PPO

## 2014-11-29 DIAGNOSIS — Z23 Encounter for immunization: Secondary | ICD-10-CM

## 2014-11-30 ENCOUNTER — Ambulatory Visit (INDEPENDENT_AMBULATORY_CARE_PROVIDER_SITE_OTHER): Payer: PPO | Admitting: Nurse Practitioner

## 2014-11-30 ENCOUNTER — Encounter: Payer: Self-pay | Admitting: Nurse Practitioner

## 2014-11-30 VITALS — BP 131/76 | HR 90 | Temp 97.5°F | Ht 64.0 in | Wt 158.0 lb

## 2014-11-30 DIAGNOSIS — J4520 Mild intermittent asthma, uncomplicated: Secondary | ICD-10-CM | POA: Diagnosis not present

## 2014-11-30 DIAGNOSIS — Z6827 Body mass index (BMI) 27.0-27.9, adult: Secondary | ICD-10-CM | POA: Diagnosis not present

## 2014-11-30 DIAGNOSIS — Z23 Encounter for immunization: Secondary | ICD-10-CM

## 2014-11-30 DIAGNOSIS — I10 Essential (primary) hypertension: Secondary | ICD-10-CM | POA: Diagnosis not present

## 2014-11-30 DIAGNOSIS — I499 Cardiac arrhythmia, unspecified: Secondary | ICD-10-CM | POA: Insufficient documentation

## 2014-11-30 MED ORDER — BECLOMETHASONE DIPROPIONATE 80 MCG/ACT IN AERS: INHALATION_SPRAY | RESPIRATORY_TRACT | Status: DC

## 2014-11-30 MED ORDER — OLMESARTAN MEDOXOMIL-HCTZ 40-25 MG PO TABS: ORAL_TABLET | ORAL | Status: DC

## 2014-11-30 NOTE — Progress Notes (Signed)
   Subjective:    Patient ID: Regina Knox, female    DOB: Aug 27, 1945, 69 y.o.   MRN: [id number]  HPI  Patient here today for follow up of chronic medical problems.  Hypertension This is a chronic problem. The current episode started more than 1 year ago. The problem has been resolved since onset. Pertinent negatives include no blurred vision, chest pain, headaches, neck pain, palpitations, peripheral edema or shortness of breath. There are no associated agents to hypertension. Risk factors for coronary artery disease include family history and post-menopausal state. Past treatments include angiotensin blockers and diuretics. The current treatment provides significant improvement. Compliance problems include exercise and diet.  There is no history of kidney disease, heart failure or a thyroid problem. There is no history of chronic renal disease or sleep apnea.  Asthma She complains of frequent throat clearing. There is no chest tightness, cough, difficulty breathing or shortness of breath. This is a chronic problem. The current episode started more than 1 year ago. The problem occurs rarely. The problem has been gradually improving. Associated symptoms include nasal congestion, postnasal drip and sneezing. Pertinent negatives include no chest pain, ear congestion, ear pain, fever, headaches, heartburn, sore throat or trouble swallowing. Her symptoms are aggravated by nothing. Her symptoms are alleviated by rest. She reports moderate improvement on treatment. There are no known risk factors for lung disease. Her past medical history is significant for asthma. There is no history of COPD, emphysema or pneumonia.      Review of Systems  Constitutional: Negative.   HENT: Negative.   Respiratory: Negative.   Cardiovascular: Negative.   Genitourinary: Negative.   Neurological: Negative.   Psychiatric/Behavioral: Negative.   All other systems reviewed and are negative.      Objective:   Physical Exam  Constitutional: She is oriented to person, place, and time. She appears well-developed and well-nourished.  HENT:  Nose: Nose normal.  Mouth/Throat: Oropharynx is clear and moist.  Eyes: EOM are normal.  Neck: Trachea normal, normal range of motion and full passive range of motion without pain. Neck supple. No JVD present. Carotid bruit is not present. No thyromegaly present.  Cardiovascular: Normal rate, normal heart sounds and intact distal pulses.  Exam reveals no gallop and no friction rub.   No murmur heard. Irregular heart beat  Pulmonary/Chest: Effort normal and breath sounds normal.  Abdominal: Soft. Bowel sounds are normal. She exhibits no distension and no mass. There is no tenderness.  Musculoskeletal: Normal range of motion.  Lymphadenopathy:    She has no cervical adenopathy.  Neurological: She is alert and oriented to person, place, and time. She has normal reflexes.  Skin: Skin is warm and dry.  Psychiatric: She has a normal mood and affect. Her behavior is normal. Judgment and thought content normal.     BP 131/76 mmHg  Pulse 90  Temp(Src) 97.5 F (36.4 C) (Oral)  Ht 5\' 4"  (1.626 m)  Wt 158 lb (71.668 kg)  BMI 27.11 kg/m2  LMP 05/25/1996 ]  EKG-NSR with Danne Baxter, FNP      Assessment & Plan:

## 2014-11-30 NOTE — Patient Instructions (Signed)
Premature Ventricular Contraction A premature ventricular contraction is an irregularity in the normal heart rhythm. These contractions are extra heartbeats that occur too early in the normal sequence. In most cases, these contractions are harmless and do not require treatment. CAUSES Premature ventricular contractions may occur without a known cause. In healthy people, the extra contractions may be caused by:  Smoking.  Drinking alcohol.  Caffeine.  Certain medicines.  Some illegal drugs.  Stress. Sometimes, changes in chemicals in the blood (electrolytes) can also cause premature ventricular contractions. They can also occur in people with heart diseases that cause a decrease in blood flow to the heart. SIGNS AND SYMPTOMS Premature ventricular contractions often do not cause any symptoms. In some cases, you may have a feeling of your heart beating fast or skipping a beat (palpitations). DIAGNOSIS Your health care provider will take your medical history and do a physical exam. During the exam, the health care provider will check for irregular heartbeats. Various tests may be done to help diagnose premature ventricular contractions. These tests may include:  An ECG (electrocardiogram) to monitor the electrical activity of your heart.  Holter monitor testing. A Holter monitor is a portable device that can monitor the electrical activity of your heart over longer periods of time.  Stress tests to see how exercise affects your heart rhythm.  Echocardiogram. This test uses sound waves (ultrasound) to produce an image of your heart.  Electrophysiology study. This is used to evaluate the electrical conduction system of your heart. TREATMENT Usually, no treatment is needed. You may be advised to avoid things that can trigger the premature contractions, such as caffeine or alcohol. Medicines are sometimes given if symptoms are severe or if the extra heartbeats are very frequent. Treatment may  also be needed for an underlying cause of the contractions if one is found. HOME CARE INSTRUCTIONS  Take medicines only as directed by your health care provider.  Make any lifestyle changes recommended by your health care provider. These may include:  Quitting smoking.  Avoiding or limiting caffeine or alcohol.  Exercising. Talk to your health care provider about what type of exercise is safe for you.  Trying to reduce stress.  Keep all follow-up visits with your health care provider. This is important. SEEK IMMEDIATE MEDICAL CARE IF:  You feel palpitations that are frequent or continual.  You have chest pain.  You have shortness of breath.  You have sweating for no reason.  You have nausea and vomiting.  You become light-headed or faint.   This information is not intended to replace advice given to you by your health care provider. Make sure you discuss any questions you have with your health care provider.   Document Released: 09/20/2003 Document Revised: 02/23/2014 Document Reviewed: 07/06/2013 Elsevier Interactive Patient Education 2016 [employer]  

## 2014-11-30 NOTE — Addendum Note (Signed)
Addended by: Rolena Infante on: 11/30/2014 05:17 PM   Modules accepted: Orders

## 2014-12-01 LAB — CMP14+EGFR
ALK PHOS: 70 IU/L (ref 39–117)
ALT: 19 IU/L (ref 0–32)
AST: 23 IU/L (ref 0–40)
Albumin/Globulin Ratio: 1.8 (ref 1.1–2.5)
Albumin: 4.2 g/dL (ref 3.5–4.8)
BILIRUBIN TOTAL: 0.5 mg/dL (ref 0.0–1.2)
BUN/Creatinine Ratio: 20 (ref 11–26)
BUN: 16 mg/dL (ref 8–27)
CHLORIDE: 99 mmol/L (ref 97–108)
CO2: 27 mmol/L (ref 18–29)
Calcium: 9.4 mg/dL (ref 8.7–10.3)
Creatinine, Ser: 0.8 mg/dL (ref 0.57–1.00)
GFR calc Af Amer: 86 mL/min/{1.73_m2} (ref >59)
GFR calc non Af Amer: 74 mL/min/{1.73_m2} (ref >59)
GLUCOSE: 103 mg/dL — AB (ref 65–99)
Globulin, Total: 2.3 g/dL (ref 1.5–4.5)
Potassium: 4.5 mmol/L (ref 3.5–5.2)
Sodium: 142 mmol/L (ref 134–144)
Total Protein: 6.5 g/dL (ref 6.0–8.5)

## 2014-12-01 LAB — LIPID PANEL
CHOLESTEROL TOTAL: 195 mg/dL (ref 100–199)
Chol/HDL Ratio: 3 ratio units (ref 0.0–4.4)
HDL: 66 mg/dL (ref >39)
LDL Calculated: 109 mg/dL — ABNORMAL HIGH (ref 0–99)
Triglycerides: 100 mg/dL (ref 0–149)
VLDL CHOLESTEROL CAL: 20 mg/dL (ref 5–40)

## 2015-01-07 ENCOUNTER — Ambulatory Visit (INDEPENDENT_AMBULATORY_CARE_PROVIDER_SITE_OTHER): Payer: PPO | Admitting: Family Medicine

## 2015-01-07 ENCOUNTER — Encounter: Payer: Self-pay | Admitting: Family Medicine

## 2015-01-07 VITALS — BP 132/82 | HR 90 | Temp 97.5°F | Ht 64.0 in | Wt 160.2 lb

## 2015-01-07 DIAGNOSIS — J0110 Acute frontal sinusitis, unspecified: Secondary | ICD-10-CM | POA: Diagnosis not present

## 2015-01-07 MED ORDER — PSEUDOEPHEDRINE-GUAIFENESIN ER 60-600 MG PO TB12: 1.0000 | ORAL_TABLET | Freq: Two times a day (BID) | ORAL | Status: AC

## 2015-01-07 MED ORDER — AMOXICILLIN-POT CLAVULANATE 875-125 MG PO TABS: 1.0000 | ORAL_TABLET | Freq: Two times a day (BID) | ORAL | Status: DC

## 2015-01-07 NOTE — Progress Notes (Signed)
Subjective:  Patient ID: Regina Knox, female    DOB: Dec 23, 1945  Age: 69 y.o. MRN: [id number]  CC: Sinusitis   HPI IZZABELLE VELTRI presents for Symptoms include congestion, facial pain, nasal congestion, no  fever,scant cough, post nasal drip and sinus pressure around the right eye. Onset of symptoms was 2 days ago, gradually worsening since that time. She has had some sx off and on for a month, but the majority started 2 days ago. Has not had wheezing or dyspnea in spite of asthma history   History Legend has a past medical history of Asthma; Hypertension; and Allergy.   She has past surgical history that includes Tubal ligation; Breast surgery (80's); and ORIF ankle fracture (Right, 11/06/2013).   Her family history includes Cancer in her sister; Drug abuse in her mother; Heart disease in her brother and father; Hypertension in her father and mother.She reports that she has never smoked. She does not have any smokeless tobacco history on file. She reports that she does not drink alcohol or use illicit drugs.  Outpatient Prescriptions Prior to Visit  Medication Sig Dispense Refill  . beclomethasone (QVAR) 80 MCG/ACT inhaler USE 2 PUFFS TWICE DAILY 8.7 g 5  . fluticasone (FLONASE) 50 MCG/ACT nasal spray USE 2 SPRAYS IN EACH NOSTRIL DAILY 16 g 0  . olmesartan-hydrochlorothiazide (BENICAR HCT) 40-25 MG tablet TAKE ONE (1) TABLET EACH DAY 30 tablet 5   No facility-administered medications prior to visit.    ROS Review of Systems  Constitutional: Negative for fever, chills, activity change and appetite change.  HENT: Positive for congestion, postnasal drip, rhinorrhea and sinus pressure. Negative for ear discharge, ear pain, hearing loss, nosebleeds, sneezing and trouble swallowing.   Respiratory: Negative for chest tightness, shortness of breath and wheezing.   Cardiovascular: Negative for chest pain and palpitations.  Skin: Negative for rash.    Objective:  BP 132/82 mmHg  Pulse  90  Temp(Src) 97.5 F (36.4 C) (Oral)  Ht 5\' 4"  (1.626 m)  Wt 160 lb 3.2 oz (72.666 kg)  BMI 27.48 kg/m2  SpO2 97%  LMP 05/25/1996  BP Readings from Last 3 Encounters:  01/07/15 132/82  11/30/14 131/76  01/31/14 122/75    Wt Readings from Last 3 Encounters:  01/07/15 160 lb 3.2 oz (72.666 kg)  11/30/14 158 lb (71.668 kg)  01/31/14 150 lb (68.04 kg)     Physical Exam  Constitutional: She appears well-developed and well-nourished.  HENT:  Head: Normocephalic and atraumatic.  Right Ear: Tympanic membrane and external ear normal. No decreased hearing is noted.  Left Ear: Tympanic membrane and external ear normal. No decreased hearing is noted.  Nose: Mucosal edema, rhinorrhea and sinus tenderness present. Right sinus exhibits maxillary sinus tenderness and frontal sinus tenderness. Left sinus exhibits no maxillary sinus tenderness and no frontal sinus tenderness.  Mouth/Throat: No oropharyngeal exudate or posterior oropharyngeal erythema.  Neck: Neck supple.  Pulmonary/Chest: Breath sounds normal. No respiratory distress.  Lymphadenopathy:       Head (right side): No preauricular adenopathy present.       Head (left side): No preauricular adenopathy present.       Right cervical: No superficial cervical adenopathy present.      Left cervical: No superficial cervical adenopathy present.    Lab Results  Component Value Date   HGBA1C 6.2* 11/06/2013    Lab Results  Component Value Date   WBC 7.0 11/08/2013   HGB 12.5 11/08/2013   HCT 36.5 11/08/2013  PLT 197 11/08/2013   GLUCOSE 103* 11/30/2014   CHOL 195 11/30/2014   TRIG 100 11/30/2014   HDL 66 11/30/2014   LDLCALC 109* 11/30/2014   ALT 19 11/30/2014   AST 23 11/30/2014   NA 142 11/30/2014   K 4.5 11/30/2014   CL 99 11/30/2014   CREATININE 0.80 11/30/2014   BUN 16 11/30/2014   CO2 27 11/30/2014   HGBA1C 6.2* 11/06/2013    Dg Ankle 2 Views Right  11/06/2013  CLINICAL DATA:  Right ankle fracture EXAM:  RIGHT ANKLE - 2 VIEW COMPARISON:  11/05/2013 FINDINGS: Dynamic plate fixation of the distal fibular fracture. Cannulated screw fixation of the median malleolar fracture. Fixation wire also noted in the medial malleolus. Ankle mortise intact. IMPRESSION: ORIF bi malleolar ankle fracture without complication. Electronically Signed   By: Suzy Bouchard M.D.   On: 11/06/2013 14:46   Dg Ankle Complete Right  11/05/2013  CLINICAL DATA:  Inversion injury with pain in deformity to right ankle today EXAM: RIGHT ANKLE - COMPLETE 3+ VIEW COMPARISON:  None. FINDINGS: There is a fracture through the medial malleolus, with the distal fracture fragment displaced medially by about 5 mm. There is an oblique mildly displaced fracture of the distal fibular metaphysis. There is diffuse moderate soft tissue swelling around the ankle. There is a joint effusion. On the lateral view there is a tiny bone fragment seen anterior to the neck of the talus, which does not appear to arise from the talus. IMPRESSION: Fractures of the medial and lateral malleoli. Electronically Signed   By: Skipper Cliche M.D.   On: 11/05/2013 23:31   Dg Ankle Right Port  11/06/2013  CLINICAL DATA:  Postreduction of ankle fracture EXAM: PORTABLE RIGHT ANKLE - 2 VIEW COMPARISON:  Radiography from yesterday FINDINGS: Eversion pattern distal fibula and medial malleolus fractures are re-demonstrated. There is improved alignment of the ankle status post medial reduction, but the medial malleolus fracture remains laterally displaced. No new osseous findings. IMPRESSION: Improved positioning of medial malleolus and distal fibula fractures. Electronically Signed   By: Jorje Guild M.D.   On: 11/06/2013 01:55   Dg C-arm 1-60 Min  11/06/2013  CLINICAL DATA:  ORIF right ankle fracture EXAM: DG C-ARM 61-120 MIN FLUOROSCOPY TIME:  17 seconds COMPARISON:  11/06/2015 FINDINGS: Single twisted 2 spot intraoperative views of the right ankle demonstrate internal  dominant and endplate fixation of the distal fibular fracture. Cannulated screw more fixation medial malleolar fracture. Ankle mortise intact. IMPRESSION: ORIF right ankle fracture without complication Electronically Signed   By: Suzy Bouchard M.D.   On: 11/06/2013 14:49    Assessment & Plan:   Claryssa was seen today for sinusitis.  Diagnoses and all orders for this visit:  Acute frontal sinusitis, recurrence not specified  Other orders -     amoxicillin-clavulanate (AUGMENTIN) 875-125 MG tablet; Take 1 tablet by mouth 2 (two) times daily. Take all of this medication -     pseudoephedrine-guaifenesin (MUCINEX D) 60-600 MG 12 hr tablet; Take 1 tablet by mouth every 12 (twelve) hours. As needed for congestion  I am having Ms. Bibbee start on amoxicillin-clavulanate and pseudoephedrine-guaifenesin. I am also having her maintain her fluticasone, olmesartan-hydrochlorothiazide, and beclomethasone.  Meds ordered this encounter  Medications  . amoxicillin-clavulanate (AUGMENTIN) 875-125 MG tablet    Sig: Take 1 tablet by mouth 2 (two) times daily. Take all of this medication    Dispense:  20 tablet    Refill:  0  . pseudoephedrine-guaifenesin (MUCINEX  D) 60-600 MG 12 hr tablet    Sig: Take 1 tablet by mouth every 12 (twelve) hours. As needed for congestion    Dispense:  20 tablet    Refill:  0     Follow-up: No Follow-up on file.  Claretta Fraise, M.D.

## 2015-02-28 ENCOUNTER — Telehealth: Payer: Self-pay | Admitting: Nurse Practitioner

## 2015-03-01 NOTE — Telephone Encounter (Signed)
WILL CALL BACK TO SCHEDULE °

## 2015-06-21 IMAGING — RF DG C-ARM 61-120 MIN
1 series · 2 of 2 positions shown · non-contrast
Comparison: 11/06/2015

CLINICAL DATA: ORIF right ankle fracture

EXAM:
DG C-ARM 61-120 MIN
FLUOROSCOPY TIME:  17 seconds

[Series 1: run · 2 of 2 slices shown]
[im 1/2]
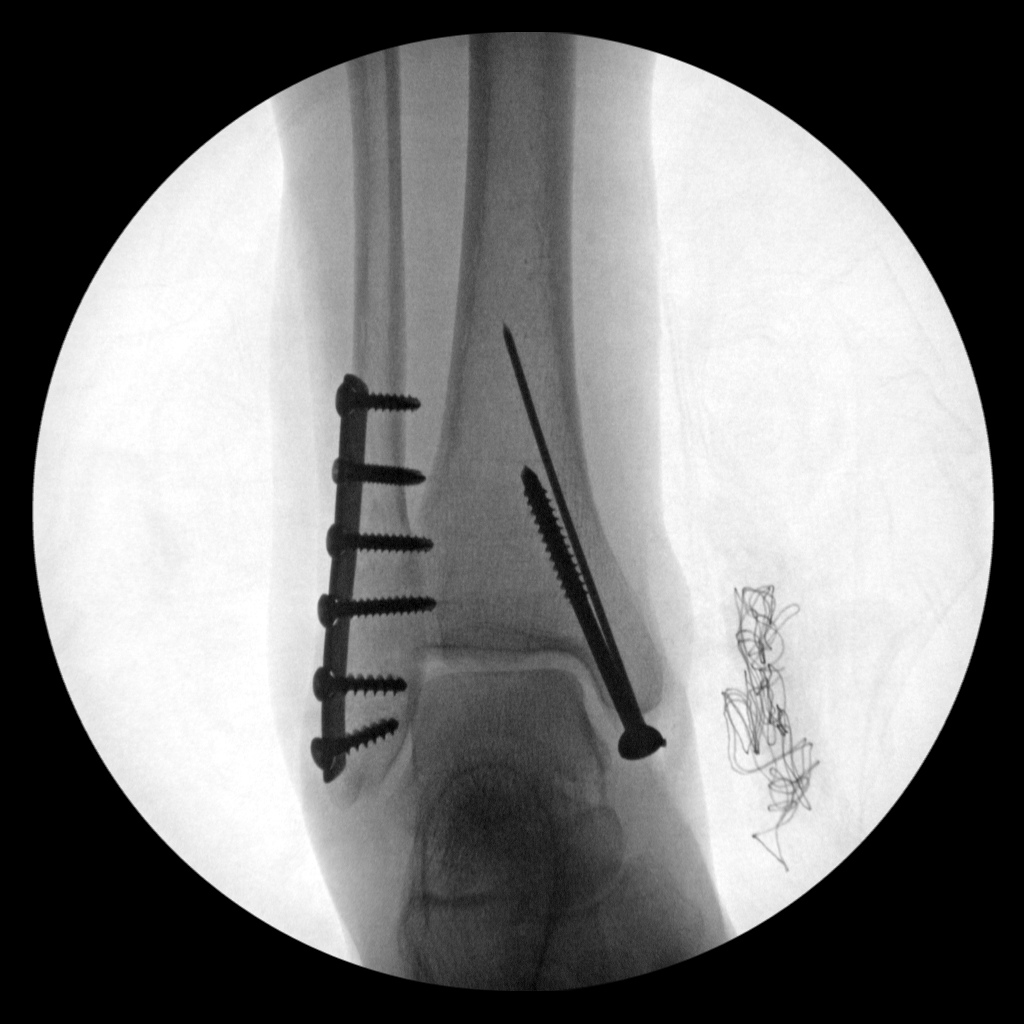
[im 2/2]
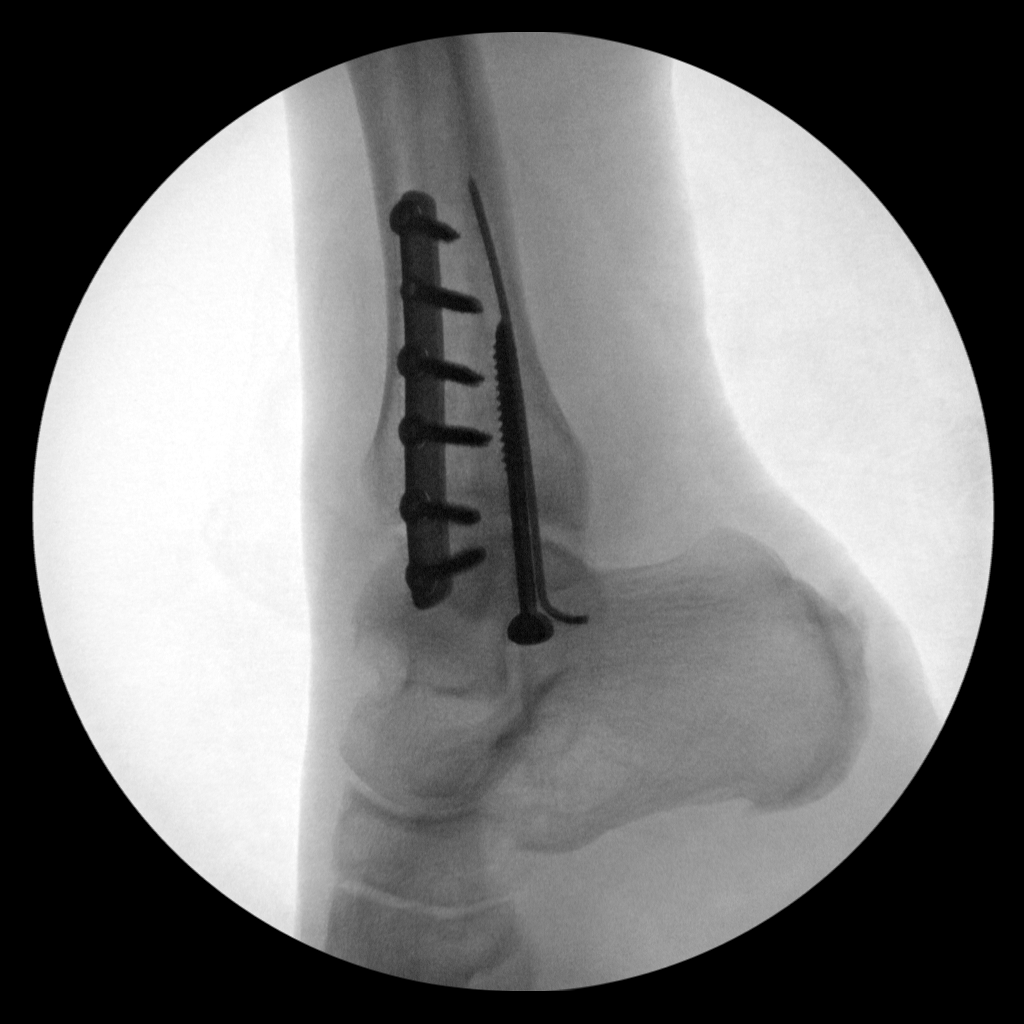

[2 of 2 positions shown; findings below may reference images not displayed]

FINDINGS: Single twisted 2 spot intraoperative views of the right ankle
demonstrate internal dominant and endplate fixation of the distal
fibular fracture. Cannulated screw more fixation medial malleolar
fracture. Ankle mortise intact.
IMPRESSION: ORIF right ankle fracture without complication

## 2015-07-30 ENCOUNTER — Encounter: Payer: Self-pay | Admitting: Family Medicine

## 2015-07-30 DIAGNOSIS — H538 Other visual disturbances: Secondary | ICD-10-CM | POA: Diagnosis not present

## 2015-07-30 DIAGNOSIS — H2513 Age-related nuclear cataract, bilateral: Secondary | ICD-10-CM | POA: Diagnosis not present

## 2015-08-19 ENCOUNTER — Telehealth: Payer: Self-pay

## 2015-08-19 NOTE — Telephone Encounter (Signed)
Insurance denied Qvar  Must try and fail one alternative   Arnuity Ellipta ,Flovent Diskus Flovent HFA Pulmicort Flexhaler Pulmicort Suspension, budesonide susp   Dr Livia Snellen

## 2015-08-29 ENCOUNTER — Encounter: Payer: Self-pay | Admitting: Family Medicine

## 2015-08-29 DIAGNOSIS — H35341 Macular cyst, hole, or pseudohole, right eye: Secondary | ICD-10-CM | POA: Diagnosis not present

## 2015-08-29 DIAGNOSIS — H538 Other visual disturbances: Secondary | ICD-10-CM | POA: Diagnosis not present

## 2015-08-29 DIAGNOSIS — H2513 Age-related nuclear cataract, bilateral: Secondary | ICD-10-CM | POA: Diagnosis not present

## 2015-09-04 DIAGNOSIS — H35342 Macular cyst, hole, or pseudohole, left eye: Secondary | ICD-10-CM | POA: Diagnosis not present

## 2015-09-04 DIAGNOSIS — H35341 Macular cyst, hole, or pseudohole, right eye: Secondary | ICD-10-CM | POA: Diagnosis not present

## 2015-11-14 ENCOUNTER — Telehealth: Payer: Self-pay

## 2015-11-14 ENCOUNTER — Other Ambulatory Visit: Payer: Self-pay | Admitting: Nurse Practitioner

## 2015-11-14 MED ORDER — FLUTICASONE FUROATE-VILANTEROL 100-25 MCG/INH IN AEPB: 1.0000 | INHALATION_SPRAY | Freq: Every day | RESPIRATORY_TRACT | Status: DC

## 2015-11-14 NOTE — Progress Notes (Signed)
qvar changed to BREO due to insurance

## 2015-11-14 NOTE — Telephone Encounter (Signed)
Let patient know that qvar changed to BREO due to insurance- 1puff daily

## 2015-11-14 NOTE — Telephone Encounter (Signed)
Patient aware.

## 2016-01-02 ENCOUNTER — Ambulatory Visit (INDEPENDENT_AMBULATORY_CARE_PROVIDER_SITE_OTHER): Payer: PPO | Admitting: Family Medicine

## 2016-01-02 ENCOUNTER — Encounter: Payer: Self-pay | Admitting: Family Medicine

## 2016-01-02 VITALS — BP 136/81 | HR 92 | Temp 97.4°F | Ht 64.0 in | Wt 156.6 lb

## 2016-01-02 DIAGNOSIS — J4530 Mild persistent asthma, uncomplicated: Secondary | ICD-10-CM

## 2016-01-02 DIAGNOSIS — I10 Essential (primary) hypertension: Secondary | ICD-10-CM | POA: Diagnosis not present

## 2016-01-02 DIAGNOSIS — R3915 Urgency of urination: Secondary | ICD-10-CM | POA: Diagnosis not present

## 2016-01-02 DIAGNOSIS — N3001 Acute cystitis with hematuria: Secondary | ICD-10-CM | POA: Diagnosis not present

## 2016-01-02 DIAGNOSIS — R300 Dysuria: Secondary | ICD-10-CM | POA: Diagnosis not present

## 2016-01-02 LAB — MICROSCOPIC EXAMINATION

## 2016-01-02 LAB — URINALYSIS, COMPLETE
Bilirubin, UA: NEGATIVE
GLUCOSE, UA: NEGATIVE
KETONES UA: NEGATIVE
NITRITE UA: NEGATIVE
PROTEIN UA: NEGATIVE
SPEC GRAV UA: 1.01 (ref 1.005–1.030)
UUROB: 0.2 mg/dL (ref 0.2–1.0)
pH, UA: 6 (ref 5.0–7.5)

## 2016-01-02 MED ORDER — CEPHALEXIN 500 MG PO CAPS: 500.0000 mg | ORAL_CAPSULE | Freq: Three times a day (TID) | ORAL | 0 refills | Status: DC

## 2016-01-02 MED ORDER — FLUTICASONE FUROATE 100 MCG/ACT IN AEPB: 1.0000 | INHALATION_SPRAY | Freq: Every day | RESPIRATORY_TRACT | 11 refills | Status: DC

## 2016-01-02 MED ORDER — OLMESARTAN MEDOXOMIL-HCTZ 40-25 MG PO TABS: ORAL_TABLET | ORAL | 0 refills | Status: DC

## 2016-01-02 NOTE — Progress Notes (Signed)
   HPI  Patient presents today here with concern for UTI.  Patient She's had burning with urination, increased urinary urgency, and urinary discomfort for about 2 days. She denies fever, chills, sweats, or difficulty tolerating foods or fluids.  Asthma Patient's been taking Qvar and treated very well for her opinion. This is no longer a preferred medication and costovertebral $100 a month. She would like a refill of a similar medicine if possible.  Her blood pressure Requests refill of medication, will come back follow-up for full evaluation and blood work.  PMH: Smoking status noted ROS: Per HPI  Objective: BP 136/81   Pulse 92   Temp 97.4 F (36.3 C) (Oral)   Ht 5\' 4"  (1.626 m)   Wt 156 lb 9.6 oz (71 kg)   LMP 05/25/1996   BMI 26.88 kg/m  Gen: NAD, alert, cooperative with exam HEENT: NCAT CV: RRR, good S1/S2, no murmur Resp: CTABL, no wheezes, non-labored Abd: SNTND, BS present, no guarding or organomegaly and no CVA tenderness Ext: No edema, warm Neuro: Alert and oriented, No gross deficits  Assessment and plan:  # UTI Treat with Keflex Urine culture pending No signs of urosepsis  # Asthma Mild, persistent Well controlled with Qvar, changing to arnuity for formulary pref   # Hypertension Well-controlled, labs are due, however she needs refills until she can come back for follow-up. Refill 3 months, follow-up in 2 months.    Orders Placed This Encounter  Procedures  . Urine culture  . Urinalysis, Complete    Meds ordered this encounter  Medications  . cephALEXin (KEFLEX) 500 MG capsule    Sig: Take 1 capsule (500 mg total) by mouth 3 (three) times daily.    Dispense:  21 capsule    Refill:  0  . Fluticasone Furoate (ARNUITY ELLIPTA) 100 MCG/ACT AEPB    Sig: Inhale 1 puff into the lungs daily.    Dispense:  30 each    Refill:  11  . olmesartan-hydrochlorothiazide (BENICAR HCT) 40-25 MG tablet    Sig: TAKE ONE (1) TABLET EACH DAY    Dispense:   90 tablet    Refill:  0    Laroy Apple, MD [facility] 01/02/2016, 4:59 PM

## 2016-01-02 NOTE — Patient Instructions (Signed)
Great to meet you!  Lets see you again in 2 months or so for labs and to discuss your blood pressure.    Urinary Tract Infection, Adult A urinary tract infection (UTI) is an infection of any part of the urinary tract, which includes the kidneys, ureters, bladder, and urethra. These organs make, store, and get rid of urine in the body. UTI can be a bladder infection (cystitis) or kidney infection (pyelonephritis). What are the causes? This infection may be caused by fungi, viruses, or bacteria. Bacteria are the most common cause of UTIs. This condition can also be caused by repeated incomplete emptying of the bladder during urination. What increases the risk? This condition is more likely to develop if:  You ignore your need to urinate or hold urine for long periods of time.  You do not empty your bladder completely during urination.  You wipe back to front after urinating or having a bowel movement, if you are female.  You are uncircumcised, if you are female.  You are constipated.  You have a urinary catheter that stays in place (indwelling).  You have a weak defense (immune) system.  You have a medical condition that affects your bowels, kidneys, or bladder.  You have diabetes.  You take antibiotic medicines frequently or for long periods of time, and the antibiotics no longer work well against certain types of infections (antibiotic resistance).  You take medicines that irritate your urinary tract.  You are exposed to chemicals that irritate your urinary tract.  You are female. What are the signs or symptoms? Symptoms of this condition include:  Fever.  Frequent urination or passing small amounts of urine frequently.  Needing to urinate urgently.  Pain or burning with urination.  Urine that smells bad or unusual.  Cloudy urine.  Pain in the lower abdomen or back.  Trouble urinating.  Blood in the urine.  Vomiting or being less hungry than normal.  Diarrhea  or abdominal pain.  Vaginal discharge, if you are female. How is this diagnosed? This condition is diagnosed with a medical history and physical exam. You will also need to provide a urine sample to test your urine. Other tests may be done, including:  Blood tests.  Sexually transmitted disease (STD) testing. If you have had more than one UTI, a cystoscopy or imaging studies may be done to determine the cause of the infections. How is this treated? Treatment for this condition often includes a combination of two or more of the following:  Antibiotic medicine.  Other medicines to treat less common causes of UTI.  Over-the-counter medicines to treat pain.  Drinking enough water to stay hydrated. Follow these instructions at home:  Take over-the-counter and prescription medicines only as told by your health care provider.  If you were prescribed an antibiotic, take it as told by your health care provider. Do not stop taking the antibiotic even if you start to feel better.  Avoid alcohol, caffeine, tea, and carbonated beverages. They can irritate your bladder.  Drink enough fluid to keep your urine clear or pale yellow.  Keep all follow-up visits as told by your health care provider. This is important.  Make sure to:  Empty your bladder often and completely. Do not hold urine for long periods of time.  Empty your bladder before and after sex.  Wipe from front to back after a bowel movement if you are female. Use each tissue one time when you wipe. Contact a health care provider if:  You have back pain.  You have a fever.  You feel nauseous or vomit.  Your symptoms do not get better after 3 days.  Your symptoms go away and then return. Get help right away if:  You have severe back pain or lower abdominal pain.  You are vomiting and cannot keep down any medicines or water. This information is not intended to replace advice given to you by your health care provider.  Make sure you discuss any questions you have with your health care provider. Document Released: 11/12/2004 Document Revised: 07/17/2015 Document Reviewed: 12/24/2014 Elsevier Interactive Patient Education  2017 [employer].

## 2016-01-04 LAB — URINE CULTURE

## 2016-01-20 ENCOUNTER — Ambulatory Visit (INDEPENDENT_AMBULATORY_CARE_PROVIDER_SITE_OTHER): Payer: PPO | Admitting: Family Medicine

## 2016-01-20 ENCOUNTER — Encounter: Payer: Self-pay | Admitting: Family Medicine

## 2016-01-20 VITALS — BP 117/70 | HR 87 | Temp 97.2°F | Ht 64.0 in | Wt 154.4 lb

## 2016-01-20 DIAGNOSIS — N3001 Acute cystitis with hematuria: Secondary | ICD-10-CM | POA: Diagnosis not present

## 2016-01-20 DIAGNOSIS — R300 Dysuria: Secondary | ICD-10-CM | POA: Diagnosis not present

## 2016-01-20 LAB — URINALYSIS
Bilirubin, UA: NEGATIVE
Glucose, UA: NEGATIVE
NITRITE UA: NEGATIVE
PH UA: 8.5 — AB (ref 5.0–7.5)
SPEC GRAV UA: 1.015 (ref 1.005–1.030)
Urobilinogen, Ur: 0.2 mg/dL (ref 0.2–1.0)

## 2016-01-20 MED ORDER — CIPROFLOXACIN HCL 250 MG PO TABS: 250.0000 mg | ORAL_TABLET | Freq: Two times a day (BID) | ORAL | 0 refills | Status: DC

## 2016-01-20 NOTE — Patient Instructions (Signed)

## 2016-01-20 NOTE — Progress Notes (Signed)
   HPI  Patient presents today here with concern for UTI.  Patient explains that after she was seen last visit her symptoms resolved completely for about one week and then returned 3 days ago.  She describes dysuria and urinary frequency. She denies any hematuria, fevers, chills, sweats, or new abdominal or back pain.  She's tolerating foods and fluids normally.   PMH: Smoking status noted ROS: Per HPI  Objective: BP 117/70   Pulse 87   Temp 97.2 F (36.2 C) (Oral)   Ht 5\' 4"  (1.626 m)   Wt 154 lb 6.4 oz (70 kg)   LMP 05/25/1996   BMI 26.50 kg/m  Gen: NAD, alert, cooperative with exam HEENT: NCAT CV: RRR, good S1/S2 Resp: CTABL, no wheezes, non-labored Abd: SNTND, BS present, no guarding or organomegaly, no CVA tenderness, no suprapubic tenderness Ext: No edema, warm Neuro: Alert and oriented, No gross deficits  Assessment and plan:  # UTI Treat with ciprofloxacin Send for culture  Previous UTI with resistance only to tetracycline and nitrofurantoin Return to clinic with any concerns or worsening symptoms.   Orders Placed This Encounter  Procedures  . Urine culture  . Urinalysis    Meds ordered this encounter  Medications  . ciprofloxacin (CIPRO) 250 MG tablet    Sig: Take 1 tablet (250 mg total) by mouth 2 (two) times daily.    Dispense:  14 tablet    Refill:  0    Laroy Apple, MD [facility] 01/20/2016, 3:55 PM

## 2016-01-23 LAB — URINE CULTURE

## 2016-02-14 ENCOUNTER — Ambulatory Visit (INDEPENDENT_AMBULATORY_CARE_PROVIDER_SITE_OTHER): Payer: PPO | Admitting: *Deleted

## 2016-02-14 DIAGNOSIS — Z23 Encounter for immunization: Secondary | ICD-10-CM | POA: Diagnosis not present

## 2016-09-03 ENCOUNTER — Encounter: Payer: Self-pay | Admitting: Nurse Practitioner

## 2016-09-03 ENCOUNTER — Ambulatory Visit (INDEPENDENT_AMBULATORY_CARE_PROVIDER_SITE_OTHER): Payer: PPO | Admitting: Nurse Practitioner

## 2016-09-03 VITALS — BP 114/73 | HR 90 | Temp 97.0°F | Ht 64.0 in | Wt 156.0 lb

## 2016-09-03 DIAGNOSIS — I10 Essential (primary) hypertension: Secondary | ICD-10-CM | POA: Diagnosis not present

## 2016-09-03 DIAGNOSIS — Z6827 Body mass index (BMI) 27.0-27.9, adult: Secondary | ICD-10-CM | POA: Diagnosis not present

## 2016-09-03 MED ORDER — OLMESARTAN MEDOXOMIL-HCTZ 40-25 MG PO TABS: ORAL_TABLET | ORAL | 0 refills | Status: DC

## 2016-09-03 NOTE — Progress Notes (Signed)
   Subjective:    Patient ID: Regina Knox, female    DOB: 1945/04/19, 71 y.o.   MRN: [id number]  HPI  Regina Knox is here today for follow up of chronic medical problem.  Outpatient Encounter Prescriptions as of 09/03/2016  Medication Sig  . ciprofloxacin (CIPRO) 250 MG tablet Take 1 tablet (250 mg total) by mouth 2 (two) times daily.  . fluticasone (FLONASE) 50 MCG/ACT nasal spray USE 2 SPRAYS IN EACH NOSTRIL DAILY  . Fluticasone Furoate (ARNUITY ELLIPTA) 100 MCG/ACT AEPB Inhale 1 puff into the lungs daily.  Marland Kitchen olmesartan-hydrochlorothiazide (BENICAR HCT) 40-25 MG tablet TAKE ONE (1) TABLET EACH DAY   No facility-administered encounter medications on file as of 09/03/2016.     1. Essential hypertension, benign  No c/o chest pain, sob or headache. Does not check blood pressures at home.  2. BMI 27.0-27.9,adult  No recent weight changes    New complaints: none  Social history: Husband has Parkinson's disease    Review of Systems  Constitutional: Negative for activity change and appetite change.  HENT: Negative.   Eyes: Negative for pain.  Respiratory: Negative for shortness of breath.   Cardiovascular: Negative for chest pain, palpitations and leg swelling.  Gastrointestinal: Negative for abdominal pain.  Endocrine: Negative for polydipsia.  Genitourinary: Negative.   Skin: Negative for rash.  Neurological: Negative for dizziness, weakness and headaches.  Hematological: Does not bruise/bleed easily.  Psychiatric/Behavioral: Negative.   All other systems reviewed and are negative.      Objective:   Physical Exam  Constitutional: She is oriented to person, place, and time. She appears well-developed and well-nourished.  HENT:  Nose: Nose normal.  Mouth/Throat: Oropharynx is clear and moist.  Eyes: EOM are normal.  Neck: Trachea normal, normal range of motion and full passive range of motion without pain. Neck supple. No JVD present. Carotid bruit is not present.  No thyromegaly present.  Cardiovascular: Normal rate, regular rhythm, normal heart sounds and intact distal pulses.  Exam reveals no gallop and no friction rub.   No murmur heard. Pulmonary/Chest: Effort normal and breath sounds normal.  Abdominal: Soft. Bowel sounds are normal. She exhibits no distension and no mass. There is no tenderness.  Musculoskeletal: Normal range of motion.  Lymphadenopathy:    She has no cervical adenopathy.  Neurological: She is alert and oriented to person, place, and time. She has normal reflexes.  Skin: Skin is warm and dry.  Psychiatric: She has a normal mood and affect. Her behavior is normal. Judgment and thought content normal.    BP 114/73   Pulse 90   Temp (!) 97 F (36.1 C) (Oral)   Ht 5' 4" (1.626 m)   Wt 156 lb (70.8 kg)   LMP 05/25/1996   BMI 26.78 kg/m         Assessment & Plan:  1. Essential hypertension, benign Low sodium diet - olmesartan-hydrochlorothiazide (BENICAR HCT) 40-25 MG tablet; TAKE ONE (1) TABLET EACH DAY  Dispense: 90 tablet; Refill: 0 - [id number]+EGFR - Lipid panel - Fecal occult blood, imunochemical; Future  2. BMI 27.0-27.9,adult Discussed diet and exercise for person with BMI >25 Will recheck weight in 3-6 months   hemoccult cards given to patient with directions Labs pending Health maintenance reviewed Diet and exercise encouraged Continue all meds Follow up  In 6 months   [place], FNP

## 2016-09-03 NOTE — Patient Instructions (Signed)
DASH Eating Plan DASH stands for "Dietary Approaches to Stop Hypertension." The DASH eating plan is a healthy eating plan that has been shown to reduce high blood pressure (hypertension). It may also reduce your risk for type 2 diabetes, heart disease, and stroke. The DASH eating plan may also help with weight loss. What are tips for following this plan? General guidelines  Avoid eating more than 2,300 mg (milligrams) of salt (sodium) a day. If you have hypertension, you may need to reduce your sodium intake to 1,500 mg a day.  Limit alcohol intake to no more than 1 drink a day for nonpregnant women and 2 drinks a day for men. One drink equals 12 oz of beer, 5 oz of wine, or 1 oz of hard liquor.  Work with your health care provider to maintain a healthy body weight or to lose weight. Ask what an ideal weight is for you.  Get at least 30 minutes of exercise that causes your heart to beat faster (aerobic exercise) most days of the week. Activities may include walking, swimming, or biking.  Work with your health care provider or diet and nutrition specialist (dietitian) to adjust your eating plan to your individual calorie needs. Reading food labels  Check food labels for the amount of sodium per serving. Choose foods with less than 5 percent of the Daily Value of sodium. Generally, foods with less than 300 mg of sodium per serving fit into this eating plan.  To find whole grains, look for the word "whole" as the first word in the ingredient list. Shopping  Buy products labeled as "low-sodium" or "no salt added."  Buy fresh foods. Avoid canned foods and premade or frozen meals. Cooking  Avoid adding salt when cooking. Use salt-free seasonings or herbs instead of table salt or sea salt. Check with your health care provider or pharmacist before using salt substitutes.  Do not fry foods. Cook foods using healthy methods such as baking, boiling, grilling, and broiling instead.  Cook with  heart-healthy oils, such as olive, canola, soybean, or sunflower oil. Meal planning   Eat a balanced diet that includes: ? 5 or more servings of fruits and vegetables each day. At each meal, try to fill half of your plate with fruits and vegetables. ? Up to 6-8 servings of whole grains each day. ? Less than 6 oz of lean meat, poultry, or fish each day. A 3-oz serving of meat is about the same size as a deck of cards. One egg equals 1 oz. ? 2 servings of low-fat dairy each day. ? A serving of nuts, seeds, or beans 5 times each week. ? Heart-healthy fats. Healthy fats called Omega-3 fatty acids are found in foods such as flaxseeds and coldwater fish, like sardines, salmon, and mackerel.  Limit how much you eat of the following: ? Canned or prepackaged foods. ? Food that is high in trans fat, such as fried foods. ? Food that is high in saturated fat, such as fatty meat. ? Sweets, desserts, sugary drinks, and other foods with added sugar. ? Full-fat dairy products.  Do not salt foods before eating.  Try to eat at least 2 vegetarian meals each week.  Eat more home-cooked food and less restaurant, buffet, and fast food.  When eating at a restaurant, ask that your food be prepared with less salt or no salt, if possible. What foods are recommended? The items listed may not be a complete list. Talk with your dietitian about what   dietary choices are best for you. Grains Whole-grain or whole-wheat bread. Whole-grain or whole-wheat pasta. Brown rice. Oatmeal. Quinoa. Bulgur. Whole-grain and low-sodium cereals. Pita bread. Low-fat, low-sodium crackers. Whole-wheat flour tortillas. Vegetables Fresh or frozen vegetables (raw, steamed, roasted, or grilled). Low-sodium or reduced-sodium tomato and vegetable juice. Low-sodium or reduced-sodium tomato sauce and tomato paste. Low-sodium or reduced-sodium canned vegetables. Fruits All fresh, dried, or frozen fruit. Canned fruit in natural juice (without  added sugar). Meat and other protein foods Skinless chicken or turkey. Ground chicken or turkey. Pork with fat trimmed off. Fish and seafood. Egg whites. Dried beans, peas, or lentils. Unsalted nuts, nut butters, and seeds. Unsalted canned beans. Lean cuts of beef with fat trimmed off. Low-sodium, lean deli meat. Dairy Low-fat (1%) or fat-free (skim) milk. Fat-free, low-fat, or reduced-fat cheeses. Nonfat, low-sodium ricotta or cottage cheese. Low-fat or nonfat yogurt. Low-fat, low-sodium cheese. Fats and oils Soft margarine without trans fats. Vegetable oil. Low-fat, reduced-fat, or light mayonnaise and salad dressings (reduced-sodium). Canola, safflower, olive, soybean, and sunflower oils. Avocado. Seasoning and other foods Herbs. Spices. Seasoning mixes without salt. Unsalted popcorn and pretzels. Fat-free sweets. What foods are not recommended? The items listed may not be a complete list. Talk with your dietitian about what dietary choices are best for you. Grains Baked goods made with fat, such as croissants, muffins, or some breads. Dry pasta or rice meal packs. Vegetables Creamed or fried vegetables. Vegetables in a cheese sauce. Regular canned vegetables (not low-sodium or reduced-sodium). Regular canned tomato sauce and paste (not low-sodium or reduced-sodium). Regular tomato and vegetable juice (not low-sodium or reduced-sodium). Pickles. Olives. Fruits Canned fruit in a light or heavy syrup. Fried fruit. Fruit in cream or butter sauce. Meat and other protein foods Fatty cuts of meat. Ribs. Fried meat. Bacon. Sausage. Bologna and other processed lunch meats. Salami. Fatback. Hotdogs. Bratwurst. Salted nuts and seeds. Canned beans with added salt. Canned or smoked fish. Whole eggs or egg yolks. Chicken or turkey with skin. Dairy Whole or 2% milk, cream, and half-and-half. Whole or full-fat cream cheese. Whole-fat or sweetened yogurt. Full-fat cheese. Nondairy creamers. Whipped toppings.  Processed cheese and cheese spreads. Fats and oils Butter. Stick margarine. Lard. Shortening. Ghee. Bacon fat. Tropical oils, such as coconut, palm kernel, or palm oil. Seasoning and other foods Salted popcorn and pretzels. Onion salt, garlic salt, seasoned salt, table salt, and sea salt. Worcestershire sauce. Tartar sauce. Barbecue sauce. Teriyaki sauce. Soy sauce, including reduced-sodium. Steak sauce. Canned and packaged gravies. Fish sauce. Oyster sauce. Cocktail sauce. Horseradish that you find on the shelf. Ketchup. Mustard. Meat flavorings and tenderizers. Bouillon cubes. Hot sauce and Tabasco sauce. Premade or packaged marinades. Premade or packaged taco seasonings. Relishes. Regular salad dressings. Where to find more information:  National Heart, Lung, and Blood Institute: www.nhlbi.nih.gov  American Heart Association: www.heart.org Summary  The DASH eating plan is a healthy eating plan that has been shown to reduce high blood pressure (hypertension). It may also reduce your risk for type 2 diabetes, heart disease, and stroke.  With the DASH eating plan, you should limit salt (sodium) intake to 2,300 mg a day. If you have hypertension, you may need to reduce your sodium intake to 1,500 mg a day.  When on the DASH eating plan, aim to eat more fresh fruits and vegetables, whole grains, lean proteins, low-fat dairy, and heart-healthy fats.  Work with your health care provider or diet and nutrition specialist (dietitian) to adjust your eating plan to your individual   calorie needs. This information is not intended to replace advice given to you by your health care provider. Make sure you discuss any questions you have with your health care provider. Document Released: 01/22/2011 Document Revised: 01/27/2016 Document Reviewed: 01/27/2016 Elsevier Interactive Patient Education  2017 Elsevier Inc.  

## 2016-09-04 LAB — CMP14+EGFR
A/G RATIO: 1.8 (ref 1.2–2.2)
ALT: 15 IU/L (ref 0–32)
AST: 24 IU/L (ref 0–40)
Albumin: 4.4 g/dL (ref 3.5–4.8)
Alkaline Phosphatase: 67 IU/L (ref 39–117)
BILIRUBIN TOTAL: 0.6 mg/dL (ref 0.0–1.2)
BUN / CREAT RATIO: 23 (ref 12–28)
BUN: 20 mg/dL (ref 8–27)
CHLORIDE: 99 mmol/L (ref 96–106)
CO2: 27 mmol/L (ref 20–29)
Calcium: 9.5 mg/dL (ref 8.7–10.3)
Creatinine, Ser: 0.88 mg/dL (ref 0.57–1.00)
GFR calc non Af Amer: 65 mL/min/{1.73_m2} (ref >59)
GFR, EST AFRICAN AMERICAN: 75 mL/min/{1.73_m2} (ref >59)
Globulin, Total: 2.5 g/dL (ref 1.5–4.5)
Glucose: 97 mg/dL (ref 65–99)
POTASSIUM: 4.2 mmol/L (ref 3.5–5.2)
SODIUM: 143 mmol/L (ref 134–144)
TOTAL PROTEIN: 6.9 g/dL (ref 6.0–8.5)

## 2016-09-04 LAB — LIPID PANEL
Chol/HDL Ratio: 3.4 ratio (ref 0.0–4.4)
Cholesterol, Total: 205 mg/dL — ABNORMAL HIGH (ref 100–199)
HDL: 60 mg/dL (ref >39)
LDL Calculated: 118 mg/dL — ABNORMAL HIGH (ref 0–99)
Triglycerides: 136 mg/dL (ref 0–149)
VLDL Cholesterol Cal: 27 mg/dL (ref 5–40)

## 2016-09-07 ENCOUNTER — Other Ambulatory Visit: Payer: PPO

## 2016-09-07 DIAGNOSIS — I10 Essential (primary) hypertension: Secondary | ICD-10-CM

## 2016-09-12 LAB — FECAL OCCULT BLOOD, IMMUNOCHEMICAL: Fecal Occult Bld: NEGATIVE

## 2016-12-11 ENCOUNTER — Ambulatory Visit (INDEPENDENT_AMBULATORY_CARE_PROVIDER_SITE_OTHER): Payer: PPO | Admitting: *Deleted

## 2016-12-11 DIAGNOSIS — Z23 Encounter for immunization: Secondary | ICD-10-CM | POA: Diagnosis not present

## 2017-02-03 ENCOUNTER — Other Ambulatory Visit: Payer: Self-pay | Admitting: Family Medicine

## 2017-04-01 ENCOUNTER — Ambulatory Visit (INDEPENDENT_AMBULATORY_CARE_PROVIDER_SITE_OTHER): Payer: PPO | Admitting: Nurse Practitioner

## 2017-04-01 ENCOUNTER — Encounter: Payer: Self-pay | Admitting: Nurse Practitioner

## 2017-04-01 VITALS — BP 131/78 | HR 82 | Temp 97.0°F | Ht 64.0 in | Wt 156.0 lb

## 2017-04-01 DIAGNOSIS — I10 Essential (primary) hypertension: Secondary | ICD-10-CM | POA: Diagnosis not present

## 2017-04-01 DIAGNOSIS — J4530 Mild persistent asthma, uncomplicated: Secondary | ICD-10-CM

## 2017-04-01 DIAGNOSIS — Z6827 Body mass index (BMI) 27.0-27.9, adult: Secondary | ICD-10-CM

## 2017-04-01 LAB — CMP14+EGFR
A/G RATIO: 1.7 (ref 1.2–2.2)
ALBUMIN: 4.3 g/dL (ref 3.5–4.8)
ALT: 22 IU/L (ref 0–32)
AST: 25 IU/L (ref 0–40)
Alkaline Phosphatase: 107 IU/L (ref 39–117)
BILIRUBIN TOTAL: 0.4 mg/dL (ref 0.0–1.2)
BUN / CREAT RATIO: 18 (ref 12–28)
BUN: 14 mg/dL (ref 8–27)
CHLORIDE: 99 mmol/L (ref 96–106)
CO2: 26 mmol/L (ref 20–29)
Calcium: 9.7 mg/dL (ref 8.7–10.3)
Creatinine, Ser: 0.8 mg/dL (ref 0.57–1.00)
GFR calc non Af Amer: 73 mL/min/{1.73_m2} (ref >59)
GFR, EST AFRICAN AMERICAN: 85 mL/min/{1.73_m2} (ref >59)
GLOBULIN, TOTAL: 2.5 g/dL (ref 1.5–4.5)
Glucose: 98 mg/dL (ref 65–99)
Potassium: 4.1 mmol/L (ref 3.5–5.2)
SODIUM: 142 mmol/L (ref 134–144)
TOTAL PROTEIN: 6.8 g/dL (ref 6.0–8.5)

## 2017-04-01 LAB — LIPID PANEL
Chol/HDL Ratio: 2.9 ratio (ref 0.0–4.4)
Cholesterol, Total: 179 mg/dL (ref 100–199)
HDL: 62 mg/dL (ref >39)
LDL Calculated: 95 mg/dL (ref 0–99)
Triglycerides: 109 mg/dL (ref 0–149)
VLDL CHOLESTEROL CAL: 22 mg/dL (ref 5–40)

## 2017-04-01 MED ORDER — FLUTICASONE FUROATE 100 MCG/ACT IN AEPB: 1.0000 | INHALATION_SPRAY | RESPIRATORY_TRACT | 5 refills | Status: DC

## 2017-04-01 MED ORDER — OLMESARTAN MEDOXOMIL-HCTZ 40-25 MG PO TABS: ORAL_TABLET | ORAL | 1 refills | Status: DC

## 2017-04-01 NOTE — Patient Instructions (Signed)

## 2017-04-01 NOTE — Progress Notes (Signed)
Subjective:    Patient ID: Regina Knox, female    DOB: 12/21/1945, 72 y.o.   MRN: [id number]  HPI  Regina Knox is here today for follow up of chronic medical problem.  Outpatient Encounter Medications as of 04/01/2017  Medication Sig  . ARNUITY ELLIPTA 100 MCG/ACT AEPB USE 1 INHALATION DAILY  . olmesartan-hydrochlorothiazide (BENICAR HCT) 40-25 MG tablet TAKE ONE (1) TABLET EACH DAY  . fluticasone (FLONASE) 50 MCG/ACT nasal spray USE 2 SPRAYS IN EACH NOSTRIL DAILY (Patient not taking: Reported on 04/01/2017)     1. Essential hypertension, benign  No c/o chest pain, sob or headache. Does not check blood blood pressure at home. BP Readings from Last 3 Encounters:  04/01/17 131/78  09/03/16 114/73  01/20/16 117/70     2. Mild persistent chronic asthma without complication  Has been doing well. No cough or wheezing- uses inhaler daily  3. 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  HENT: Negative.   Eyes: Negative for pain.  Respiratory: Negative for shortness of breath.   Cardiovascular: Negative for chest pain, palpitations and leg swelling.  Gastrointestinal: Negative for abdominal pain.  Endocrine: Negative for polydipsia.  Genitourinary: Negative.   Skin: Negative for rash.  Neurological: Negative for dizziness, weakness and headaches.  Hematological: Does not bruise/bleed easily.  Psychiatric/Behavioral: Negative.   All other systems reviewed and are negative.      Objective:   Physical Exam  Constitutional: She is oriented to person, place, and time. She appears well-developed and well-nourished.  HENT:  Nose: Nose normal.  Mouth/Throat: Oropharynx is clear and moist.  Eyes: EOM are normal.  Neck: Trachea normal, normal range of motion and full passive range of  motion without pain. Neck supple. No JVD present. Carotid bruit is not present. No thyromegaly present.  Cardiovascular: Normal rate, normal heart sounds and intact distal pulses. Exam reveals no gallop and no friction rub.  No murmur heard. Pulmonary/Chest: Effort normal and breath sounds normal.  Abdominal: Soft. Bowel sounds are normal. She exhibits no distension and no mass. There is no tenderness.  Musculoskeletal: Normal range of motion.  Lymphadenopathy:    She has no cervical adenopathy.  Neurological: She is alert and oriented to person, place, and time. She has normal reflexes.  Skin: Skin is warm and dry.  Psychiatric: She has a normal mood and affect. Her behavior is normal. Judgment and thought content normal.    BP 131/78   Pulse 82   Temp (!) 97 F (36.1 C) (Oral)   Ht '5\' 4"'  (1.626 m)   Wt 156 lb (70.8 kg)   LMP 05/25/1996   BMI 26.78 kg/m      Assessment & Plan:  1. Essential hypertension, benign Low sodium diet - olmesartan-hydrochlorothiazide (BENICAR HCT) 40-25 MG tablet; TAKE ONE (1) TABLET EACH DAY  Dispense: 90 tablet; Refill: 1 - CMP14+EGFR - Lipid panel  2. Mild persistent chronic asthma without complication - Fluticasone Furoate (ARNUITY ELLIPTA) 100 MCG/ACT AEPB; Inhale 1 puff into the lungs 1 day or 1 dose for 1 dose.  Dispense: 30 each; Refill: 5  3. BMI 27.0-27.9,adult Discussed diet and exercise for person with BMI >25 Will recheck weight in 3-6 months    Labs pending Health maintenance reviewed Diet and exercise encouraged Continue all meds Follow  up  In 6 month   [place], FNP

## 2017-06-07 ENCOUNTER — Encounter: Payer: Self-pay | Admitting: Family Medicine

## 2017-06-07 ENCOUNTER — Ambulatory Visit (INDEPENDENT_AMBULATORY_CARE_PROVIDER_SITE_OTHER): Payer: Medicare HMO | Admitting: Family Medicine

## 2017-06-07 VITALS — BP 119/75 | HR 94 | Temp 98.2°F | Ht 64.0 in | Wt 159.0 lb

## 2017-06-07 DIAGNOSIS — N3001 Acute cystitis with hematuria: Secondary | ICD-10-CM | POA: Diagnosis not present

## 2017-06-07 DIAGNOSIS — R300 Dysuria: Secondary | ICD-10-CM | POA: Diagnosis not present

## 2017-06-07 LAB — URINALYSIS
BILIRUBIN UA: NEGATIVE
Glucose, UA: NEGATIVE
Nitrite, UA: POSITIVE — AB
Specific Gravity, UA: 1.025 (ref 1.005–1.030)
Urobilinogen, Ur: 0.2 mg/dL (ref 0.2–1.0)
pH, UA: 6 (ref 5.0–7.5)

## 2017-06-07 MED ORDER — CEPHALEXIN 500 MG PO CAPS: 500.0000 mg | ORAL_CAPSULE | Freq: Three times a day (TID) | ORAL | 0 refills | Status: DC

## 2017-06-07 MED ORDER — CIPROFLOXACIN HCL 250 MG PO TABS
250.0000 mg | ORAL_TABLET | Freq: Two times a day (BID) | ORAL | 0 refills | Status: DC
Start: 2017-06-07 — End: 2017-06-24

## 2017-06-07 NOTE — Progress Notes (Signed)
   HPI  Patient presents today here for symptoms of UTI.  Patient states she has had 1 to 2 days of symptoms.  She has increased frequency, incontinence, and a little bit of dysuria.  She denies fever, chills, sweats.  She is tolerating food and fluids like usual. She has not had a UTI in 2 years  PMH: Smoking status noted ROS: Per HPI  Objective: BP 119/75   Pulse 94   Temp 98.2 F (36.8 C) (Oral)   Ht 5\' 4"  (1.626 m)   Wt 159 lb (72.1 kg)   LMP 05/25/1996   BMI 27.29 kg/m  Gen: NAD, alert, cooperative with exam HEENT: NCAT CV: RRR, good S1/S2, no murmur Resp: CTABL, no wheezes, non-labored Abd: No CVA tenderness, no suprapubic tenderness to palpation Ext: No edema, warm Neuro: Alert and oriented, No gross deficits  Assessment and plan:  #UTI Treat with Keflex Culture No signs of pyelonephritis RTC with any concerns  Orders Placed This Encounter  Procedures  . Urine Culture  . Urinalysis    Meds ordered this encounter  Medications  . DISCONTD: cephALEXin (KEFLEX) 500 MG capsule    Sig: Take 1 capsule (500 mg total) by mouth 3 (three) times daily.    Dispense:  21 capsule    Refill:  0  . ciprofloxacin (CIPRO) 250 MG tablet    Sig: Take 1 tablet (250 mg total) by mouth 2 (two) times daily.    Dispense:  14 tablet    Refill:  0    Please dc keflex, change of therapy after reviewing chart    Laroy Apple, MD [facility] 06/07/2017, 5:59 PM

## 2017-06-07 NOTE — Patient Instructions (Signed)
Great to see you!   Urinary Tract Infection, Adult A urinary tract infection (UTI) is an infection of any part of the urinary tract, which includes the kidneys, ureters, bladder, and urethra. These organs make, store, and get rid of urine in the body. UTI can be a bladder infection (cystitis) or kidney infection (pyelonephritis). What are the causes? This infection may be caused by fungi, viruses, or bacteria. Bacteria are the most common cause of UTIs. This condition can also be caused by repeated incomplete emptying of the bladder during urination. What increases the risk? This condition is more likely to develop if:  You ignore your need to urinate or hold urine for long periods of time.  You do not empty your bladder completely during urination.  You wipe back to front after urinating or having a bowel movement, if you are female.  You are uncircumcised, if you are female.  You are constipated.  You have a urinary catheter that stays in place (indwelling).  You have a weak defense (immune) system.  You have a medical condition that affects your bowels, kidneys, or bladder.  You have diabetes.  You take antibiotic medicines frequently or for long periods of time, and the antibiotics no longer work well against certain types of infections (antibiotic resistance).  You take medicines that irritate your urinary tract.  You are exposed to chemicals that irritate your urinary tract.  You are female.  What are the signs or symptoms? Symptoms of this condition include:  Fever.  Frequent urination or passing small amounts of urine frequently.  Needing to urinate urgently.  Pain or burning with urination.  Urine that smells bad or unusual.  Cloudy urine.  Pain in the lower abdomen or back.  Trouble urinating.  Blood in the urine.  Vomiting or being less hungry than normal.  Diarrhea or abdominal pain.  Vaginal discharge, if you are female.  How is this  diagnosed? This condition is diagnosed with a medical history and physical exam. You will also need to provide a urine sample to test your urine. Other tests may be done, including:  Blood tests.  Sexually transmitted disease (STD) testing.  If you have had more than one UTI, a cystoscopy or imaging studies may be done to determine the cause of the infections. How is this treated? Treatment for this condition often includes a combination of two or more of the following:  Antibiotic medicine.  Other medicines to treat less common causes of UTI.  Over-the-counter medicines to treat pain.  Drinking enough water to stay hydrated.  Follow these instructions at home:  Take over-the-counter and prescription medicines only as told by your health care provider.  If you were prescribed an antibiotic, take it as told by your health care provider. Do not stop taking the antibiotic even if you start to feel better.  Avoid alcohol, caffeine, tea, and carbonated beverages. They can irritate your bladder.  Drink enough fluid to keep your urine clear or pale yellow.  Keep all follow-up visits as told by your health care provider. This is important.  Make sure to: ? Empty your bladder often and completely. Do not hold urine for long periods of time. ? Empty your bladder before and after sex. ? Wipe from front to back after a bowel movement if you are female. Use each tissue one time when you wipe. Contact a health care provider if:  You have back pain.  You have a fever.  You feel nauseous or   vomit.  Your symptoms do not get better after 3 days.  Your symptoms go away and then return. Get help right away if:  You have severe back pain or lower abdominal pain.  You are vomiting and cannot keep down any medicines or water. This information is not intended to replace advice given to you by your health care provider. Make sure you discuss any questions you have with your health care  provider. Document Released: 11/12/2004 Document Revised: 07/17/2015 Document Reviewed: 12/24/2014 Elsevier Interactive Patient Education  2018 Elsevier Inc.  

## 2017-06-09 LAB — URINE CULTURE

## 2017-06-24 ENCOUNTER — Ambulatory Visit (INDEPENDENT_AMBULATORY_CARE_PROVIDER_SITE_OTHER): Payer: Medicare HMO | Admitting: Nurse Practitioner

## 2017-06-24 ENCOUNTER — Encounter: Payer: Self-pay | Admitting: Nurse Practitioner

## 2017-06-24 ENCOUNTER — Other Ambulatory Visit: Payer: Self-pay

## 2017-06-24 VITALS — BP 136/87 | HR 95 | Temp 96.8°F | Ht 64.0 in | Wt 157.0 lb

## 2017-06-24 DIAGNOSIS — J069 Acute upper respiratory infection, unspecified: Secondary | ICD-10-CM | POA: Diagnosis not present

## 2017-06-24 MED ORDER — PREDNISONE 20 MG PO TABS: ORAL_TABLET | ORAL | 0 refills | Status: DC

## 2017-06-24 MED ORDER — AZITHROMYCIN 250 MG PO TABS: ORAL_TABLET | ORAL | 0 refills | Status: DC

## 2017-06-24 NOTE — Patient Instructions (Signed)

## 2017-06-24 NOTE — Progress Notes (Signed)
   Subjective:    Patient ID: Regina Knox, female    DOB: [birth year], 72 y.o.   MRN: [id number]   Chief Complaint: Cough and Nasal Congestion   HPI Patient comes in today c/o cough and congestion. She thought it was allergies to start with and now it is in hr chest and she is now wheezing. This started 1 week ago. She has tried allergy pills with no relief.   Review of Systems  Constitutional: Negative for appetite change, chills and fever.  HENT: Positive for congestion, postnasal drip, rhinorrhea and sinus pressure. Negative for sore throat and trouble swallowing.   Respiratory: Positive for cough (wet cough). Negative for shortness of breath.   Cardiovascular: Negative.   Neurological: Positive for headaches.  Psychiatric/Behavioral: Negative.   All other systems reviewed and are negative.      Objective:   Physical Exam  Constitutional: She is oriented to person, place, and time. She appears well-developed and well-nourished.  HENT:  Right Ear: Hearing, tympanic membrane, external ear and ear canal normal.  Left Ear: Hearing, tympanic membrane, external ear and ear canal normal.  Nose: Mucosal edema and rhinorrhea present. Right sinus exhibits no maxillary sinus tenderness and no frontal sinus tenderness. Left sinus exhibits no maxillary sinus tenderness and no frontal sinus tenderness.  Mouth/Throat: Uvula is midline, oropharynx is clear and moist and mucous membranes are normal.  Eyes: Pupils are equal, round, and reactive to light.  Neck: Normal range of motion. Neck supple.  Cardiovascular: Normal rate and regular rhythm.  Pulmonary/Chest: Effort normal. She has wheezes (exp wheezes throughout).  Abdominal: Soft.  Neurological: She is alert and oriented to person, place, and time.  Skin: Skin is warm and dry.  Psychiatric: She has a normal mood and affect. Her behavior is normal. Judgment and thought content normal.   BP 136/87   Pulse 95   Temp (!) 96.8 F (36 C)  (Oral)   Ht 5\' 4"  (1.626 m)   Wt 157 lb (71.2 kg)   LMP 05/25/1996   BMI 26.95 kg/m         Assessment & Plan:  LEEZA HEINER in today with chief complaint of Cough and Nasal Congestion   1. Upper respiratory infection with cough and congestion 1. Take meds as prescribed 2. Use a cool mist humidifier especially during the winter months and when heat has been humid. 3. Use saline nose sprays frequently 4. Saline irrigations of the nose can be very helpful if done frequently.  * 4X daily for 1 week*  * Use of a nettie pot can be helpful with this. Follow directions with this* 5. Drink plenty of fluids 6. Keep thermostat turn down low 7.For any cough or congestion  Use plain Mucinex- regular strength or max strength is fine   * Children- consult with Pharmacist for dosing 8. For fever or aces or pains- take tylenol or ibuprofen appropriate for age and weight.  * for fevers greater than 101 orally you may alternate ibuprofen and tylenol every  3 hours.    - predniSONE (DELTASONE) 20 MG tablet; 2 po at sametime daily for 5 days  Dispense: 10 tablet; Refill: 0 - azithromycin (ZITHROMAX Z-PAK) 250 MG tablet; As directed  Dispense: 6 tablet; Refill: 0  Mary-Margaret Hassell Done, FNP

## 2017-11-24 ENCOUNTER — Ambulatory Visit (INDEPENDENT_AMBULATORY_CARE_PROVIDER_SITE_OTHER): Payer: Medicare HMO | Admitting: *Deleted

## 2017-11-24 DIAGNOSIS — Z23 Encounter for immunization: Secondary | ICD-10-CM | POA: Diagnosis not present

## 2017-11-24 NOTE — Progress Notes (Signed)
Pt given flu vaccine Tolerated well 

## 2017-12-07 ENCOUNTER — Other Ambulatory Visit: Payer: Self-pay | Admitting: Nurse Practitioner

## 2017-12-07 DIAGNOSIS — J4530 Mild persistent asthma, uncomplicated: Secondary | ICD-10-CM

## 2018-01-27 ENCOUNTER — Other Ambulatory Visit: Payer: Self-pay | Admitting: Nurse Practitioner

## 2018-01-27 DIAGNOSIS — J4530 Mild persistent asthma, uncomplicated: Secondary | ICD-10-CM

## 2018-01-28 NOTE — Telephone Encounter (Signed)
Last seen 04/01/17 last filled 12/07/17

## 2018-03-04 ENCOUNTER — Encounter: Payer: Self-pay | Admitting: Nurse Practitioner

## 2018-03-04 ENCOUNTER — Ambulatory Visit (INDEPENDENT_AMBULATORY_CARE_PROVIDER_SITE_OTHER): Payer: Medicare HMO | Admitting: Nurse Practitioner

## 2018-03-04 VITALS — BP 135/75 | HR 75 | Temp 97.1°F | Ht 64.0 in | Wt 161.0 lb

## 2018-03-04 DIAGNOSIS — I10 Essential (primary) hypertension: Secondary | ICD-10-CM

## 2018-03-04 DIAGNOSIS — Z6827 Body mass index (BMI) 27.0-27.9, adult: Secondary | ICD-10-CM

## 2018-03-04 MED ORDER — OLMESARTAN MEDOXOMIL-HCTZ 40-25 MG PO TABS: ORAL_TABLET | ORAL | 1 refills | Status: DC

## 2018-03-04 NOTE — Progress Notes (Signed)
Subjective:    Patient ID: Regina Knox, female    DOB: [birth year], 73 y.o.   MRN: [id number]   Chief Complaint: Medical Management of Chronic Issues   HPI:  1. Essential hypertension, benign  No c/o chest pain, sob or headache. Does not check blood pressure at home. BP Readings from Last 3 Encounters:  03/04/18 135/75  06/24/17 136/87  06/07/17 119/75     2. BMI 27.0-27.9,adult  Weight is up 4lbs since last visit    Outpatient Encounter Medications as of 03/04/2018  Medication Sig  . ARNUITY ELLIPTA 100 MCG/ACT AEPB USE 1 INHALATION DAILY  . fluticasone (FLONASE) 50 MCG/ACT nasal spray USE 2 SPRAYS IN EACH NOSTRIL DAILY  . 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   Review of Systems  Constitutional: Negative for activity change and appetite change.  HENT: Negative.   Eyes: Negative for pain.  Respiratory: Negative for shortness of breath.   Cardiovascular: Negative for chest pain, palpitations and leg swelling.  Gastrointestinal: Negative for abdominal pain.  Endocrine: Negative for polydipsia.  Genitourinary: Negative.   Skin: Negative for rash.  Neurological: Negative for dizziness, weakness and headaches.  Hematological: Does not bruise/bleed easily.  Psychiatric/Behavioral: Negative.   All other systems reviewed and are negative.      Objective:   Physical Exam Vitals signs and nursing note reviewed.  Constitutional:      General: She is not in acute distress.    Appearance: Normal appearance. She is well-developed and normal weight.  HENT:     Head: Normocephalic.     Nose: Nose normal.  Eyes:     Pupils: Pupils are equal, round, and reactive to light.  Neck:     Musculoskeletal: Normal range of motion and neck supple.     Vascular: No carotid bruit or JVD.  Cardiovascular:     Rate  and Rhythm: Normal rate and regular rhythm.     Heart sounds: Normal heart sounds.  Pulmonary:     Effort: Pulmonary effort is normal. No respiratory distress.     Breath sounds: Normal breath sounds. No wheezing or rales.  Chest:     Chest wall: No tenderness.  Abdominal:     General: Bowel sounds are normal. There is no distension or abdominal bruit.     Palpations: Abdomen is soft. There is no hepatomegaly, splenomegaly, mass or pulsatile mass.     Tenderness: There is no abdominal tenderness.  Musculoskeletal: Normal range of motion.  Lymphadenopathy:     Cervical: No cervical adenopathy.  Skin:    General: Skin is warm and dry.  Neurological:     Mental Status: She is alert and oriented to person, place, and time.     Deep Tendon Reflexes: Reflexes are normal and symmetric.  Psychiatric:        Behavior: Behavior normal.        Thought Content: Thought content normal.        Judgment: Judgment normal.    BP 135/75   Pulse 75   Temp (!) 97.1 F (36.2 C) (Oral)   Ht '5\' 4"'  (1.626 m)   Wt 161 lb (73 kg)   LMP 05/25/1996   BMI 27.64 kg/m       Assessment & Plan:  Regina Knox comes in today with chief complaint of Medical  Management of Chronic Issues   Diagnosis and orders addressed:  1. Essential hypertension, benign Low sodium diet - CMP14+EGFR - Lipid panel - olmesartan-hydrochlorothiazide (BENICAR HCT) 40-25 MG tablet; TAKE ONE (1) TABLET EACH DAY  Dispense: 90 tablet; Refill: 1  2. BMI 27.0-27.9,adult Discussed diet and exercise for person with BMI >25 Will recheck weight in 3-6 months   Labs pending Health Maintenance reviewed Diet and exercise encouraged  Follow up plan: 6 months   Mary-Margaret Hassell Done, FNP

## 2018-03-04 NOTE — Patient Instructions (Signed)
DASH Eating Plan  DASH stands for "Dietary Approaches to Stop Hypertension." The DASH eating plan is a healthy eating plan that has been shown to reduce high blood pressure (hypertension). It may also reduce your risk for type 2 diabetes, heart disease, and stroke. The DASH eating plan may also help with weight loss.  What are tips for following this plan?    General guidelines   Avoid eating more than 2,300 mg (milligrams) of salt (sodium) a day. If you have hypertension, you may need to reduce your sodium intake to 1,500 mg a day.   Limit alcohol intake to no more than 1 drink a day for nonpregnant women and 2 drinks a day for men. One drink equals 12 oz of beer, 5 oz of wine, or 1 oz of hard liquor.   Work with your health care provider to maintain a healthy body weight or to lose weight. Ask what an ideal weight is for you.   Get at least 30 minutes of exercise that causes your heart to beat faster (aerobic exercise) most days of the week. Activities may include walking, swimming, or biking.   Work with your health care provider or diet and nutrition specialist (dietitian) to adjust your eating plan to your individual calorie needs.  Reading food labels     Check food labels for the amount of sodium per serving. Choose foods with less than 5 percent of the Daily Value of sodium. Generally, foods with less than 300 mg of sodium per serving fit into this eating plan.   To find whole grains, look for the word "whole" as the first word in the ingredient list.  Shopping   Buy products labeled as "low-sodium" or "no salt added."   Buy fresh foods. Avoid canned foods and premade or frozen meals.  Cooking   Avoid adding salt when cooking. Use salt-free seasonings or herbs instead of table salt or sea salt. Check with your health care provider or pharmacist before using salt substitutes.   Do not fry foods. Cook foods using healthy methods such as baking, boiling, grilling, and broiling instead.   Cook with  heart-healthy oils, such as olive, canola, soybean, or sunflower oil.  Meal planning   Eat a balanced diet that includes:  ? 5 or more servings of fruits and vegetables each day. At each meal, try to fill half of your plate with fruits and vegetables.  ? Up to 6-8 servings of whole grains each day.  ? Less than 6 oz of lean meat, poultry, or fish each day. A 3-oz serving of meat is about the same size as a deck of cards. One egg equals 1 oz.  ? 2 servings of low-fat dairy each day.  ? A serving of nuts, seeds, or beans 5 times each week.  ? Heart-healthy fats. Healthy fats called Omega-3 fatty acids are found in foods such as flaxseeds and coldwater fish, like sardines, salmon, and mackerel.   Limit how much you eat of the following:  ? Canned or prepackaged foods.  ? Food that is high in trans fat, such as fried foods.  ? Food that is high in saturated fat, such as fatty meat.  ? Sweets, desserts, sugary drinks, and other foods with added sugar.  ? Full-fat dairy products.   Do not salt foods before eating.   Try to eat at least 2 vegetarian meals each week.   Eat more home-cooked food and less restaurant, buffet, and fast food.     When eating at a restaurant, ask that your food be prepared with less salt or no salt, if possible.  What foods are recommended?  The items listed may not be a complete list. Talk with your dietitian about what dietary choices are best for you.  Grains  Whole-grain or whole-wheat bread. Whole-grain or whole-wheat pasta. Brown rice. Oatmeal. Quinoa. Bulgur. Whole-grain and low-sodium cereals. Pita bread. Low-fat, low-sodium crackers. Whole-wheat flour tortillas.  Vegetables  Fresh or frozen vegetables (raw, steamed, roasted, or grilled). Low-sodium or reduced-sodium tomato and vegetable juice. Low-sodium or reduced-sodium tomato sauce and tomato paste. Low-sodium or reduced-sodium canned vegetables.  Fruits  All fresh, dried, or frozen fruit. Canned fruit in natural juice (without  added sugar).  Meat and other protein foods  Skinless chicken or turkey. Ground chicken or turkey. Pork with fat trimmed off. Fish and seafood. Egg whites. Dried beans, peas, or lentils. Unsalted nuts, nut butters, and seeds. Unsalted canned beans. Lean cuts of beef with fat trimmed off. Low-sodium, lean deli meat.  Dairy  Low-fat (1%) or fat-free (skim) milk. Fat-free, low-fat, or reduced-fat cheeses. Nonfat, low-sodium ricotta or cottage cheese. Low-fat or nonfat yogurt. Low-fat, low-sodium cheese.  Fats and oils  Soft margarine without trans fats. Vegetable oil. Low-fat, reduced-fat, or light mayonnaise and salad dressings (reduced-sodium). Canola, safflower, olive, soybean, and sunflower oils. Avocado.  Seasoning and other foods  Herbs. Spices. Seasoning mixes without salt. Unsalted popcorn and pretzels. Fat-free sweets.  What foods are not recommended?  The items listed may not be a complete list. Talk with your dietitian about what dietary choices are best for you.  Grains  Baked goods made with fat, such as croissants, muffins, or some breads. Dry pasta or rice meal packs.  Vegetables  Creamed or fried vegetables. Vegetables in a cheese sauce. Regular canned vegetables (not low-sodium or reduced-sodium). Regular canned tomato sauce and paste (not low-sodium or reduced-sodium). Regular tomato and vegetable juice (not low-sodium or reduced-sodium). Pickles. Olives.  Fruits  Canned fruit in a light or heavy syrup. Fried fruit. Fruit in cream or butter sauce.  Meat and other protein foods  Fatty cuts of meat. Ribs. Fried meat. Bacon. Sausage. Bologna and other processed lunch meats. Salami. Fatback. Hotdogs. Bratwurst. Salted nuts and seeds. Canned beans with added salt. Canned or smoked fish. Whole eggs or egg yolks. Chicken or turkey with skin.  Dairy  Whole or 2% milk, cream, and half-and-half. Whole or full-fat cream cheese. Whole-fat or sweetened yogurt. Full-fat cheese. Nondairy creamers. Whipped toppings.  Processed cheese and cheese spreads.  Fats and oils  Butter. Stick margarine. Lard. Shortening. Ghee. Bacon fat. Tropical oils, such as coconut, palm kernel, or palm oil.  Seasoning and other foods  Salted popcorn and pretzels. Onion salt, garlic salt, seasoned salt, table salt, and sea salt. Worcestershire sauce. Tartar sauce. Barbecue sauce. Teriyaki sauce. Soy sauce, including reduced-sodium. Steak sauce. Canned and packaged gravies. Fish sauce. Oyster sauce. Cocktail sauce. Horseradish that you find on the shelf. Ketchup. Mustard. Meat flavorings and tenderizers. Bouillon cubes. Hot sauce and Tabasco sauce. Premade or packaged marinades. Premade or packaged taco seasonings. Relishes. Regular salad dressings.  Where to find more information:   National Heart, Lung, and Blood Institute: www.nhlbi.nih.gov   American Heart Association: www.heart.org  Summary   The DASH eating plan is a healthy eating plan that has been shown to reduce high blood pressure (hypertension). It may also reduce your risk for type 2 diabetes, heart disease, and stroke.   With the   DASH eating plan, you should limit salt (sodium) intake to 2,300 mg a day. If you have hypertension, you may need to reduce your sodium intake to 1,500 mg a day.   When on the DASH eating plan, aim to eat more fresh fruits and vegetables, whole grains, lean proteins, low-fat dairy, and heart-healthy fats.   Work with your health care provider or diet and nutrition specialist (dietitian) to adjust your eating plan to your individual calorie needs.  This information is not intended to replace advice given to you by your health care provider. Make sure you discuss any questions you have with your health care provider.  Document Released: 01/22/2011 Document Revised: 01/27/2016 Document Reviewed: 01/27/2016  Elsevier Interactive Patient Education  2019 Elsevier Inc.

## 2018-03-05 LAB — LIPID PANEL
CHOLESTEROL TOTAL: 212 mg/dL — AB (ref 100–199)
Chol/HDL Ratio: 3.3 ratio (ref 0.0–4.4)
HDL: 64 mg/dL (ref >39)
LDL Calculated: 131 mg/dL — ABNORMAL HIGH (ref 0–99)
TRIGLYCERIDES: 85 mg/dL (ref 0–149)
VLDL CHOLESTEROL CAL: 17 mg/dL (ref 5–40)

## 2018-03-05 LAB — CMP14+EGFR
ALK PHOS: 77 IU/L (ref 39–117)
ALT: 31 IU/L (ref 0–32)
AST: 27 IU/L (ref 0–40)
Albumin/Globulin Ratio: 1.7 (ref 1.2–2.2)
Albumin: 4.3 g/dL (ref 3.5–4.8)
BILIRUBIN TOTAL: 0.3 mg/dL (ref 0.0–1.2)
BUN/Creatinine Ratio: 22 (ref 12–28)
BUN: 17 mg/dL (ref 8–27)
CHLORIDE: 98 mmol/L (ref 96–106)
CO2: 25 mmol/L (ref 20–29)
Calcium: 9.7 mg/dL (ref 8.7–10.3)
Creatinine, Ser: 0.78 mg/dL (ref 0.57–1.00)
GFR calc Af Amer: 87 mL/min/{1.73_m2} (ref >59)
GFR calc non Af Amer: 75 mL/min/{1.73_m2} (ref >59)
GLUCOSE: 98 mg/dL (ref 65–99)
Globulin, Total: 2.5 g/dL (ref 1.5–4.5)
Potassium: 4.1 mmol/L (ref 3.5–5.2)
Sodium: 141 mmol/L (ref 134–144)
Total Protein: 6.8 g/dL (ref 6.0–8.5)

## 2018-03-09 ENCOUNTER — Telehealth: Payer: Self-pay | Admitting: Nurse Practitioner

## 2018-03-09 DIAGNOSIS — J4530 Mild persistent asthma, uncomplicated: Secondary | ICD-10-CM

## 2018-03-09 MED ORDER — FLUTICASONE FUROATE 100 MCG/ACT IN AEPB: 1.0000 | INHALATION_SPRAY | Freq: Every day | RESPIRATORY_TRACT | 6 refills | Status: DC

## 2018-03-09 NOTE — Telephone Encounter (Signed)
Script refilled

## 2018-03-09 NOTE — Telephone Encounter (Signed)
Pt states that the   ARNUITY ELLIPTA 100 MCG/ACT AEPB  Was not sent at her recent visit with MMM can we please send in Pharmacy: [facility]

## 2018-03-09 NOTE — Telephone Encounter (Signed)
Yes, you can do 6 refills.

## 2018-03-09 NOTE — Telephone Encounter (Signed)
Please advise if more than one refill can be done on this medicine.

## 2018-04-06 ENCOUNTER — Other Ambulatory Visit: Payer: Self-pay

## 2018-04-06 DIAGNOSIS — J4530 Mild persistent asthma, uncomplicated: Secondary | ICD-10-CM

## 2018-04-06 MED ORDER — FLUTICASONE FUROATE 100 MCG/ACT IN AEPB: 1.0000 | INHALATION_SPRAY | Freq: Every day | RESPIRATORY_TRACT | 6 refills | Status: DC

## 2018-12-19 ENCOUNTER — Ambulatory Visit (INDEPENDENT_AMBULATORY_CARE_PROVIDER_SITE_OTHER): Payer: Medicare HMO

## 2018-12-19 DIAGNOSIS — Z23 Encounter for immunization: Secondary | ICD-10-CM

## 2019-04-27 ENCOUNTER — Other Ambulatory Visit: Payer: Self-pay

## 2019-04-28 ENCOUNTER — Encounter: Payer: Self-pay | Admitting: Nurse Practitioner

## 2019-04-28 ENCOUNTER — Ambulatory Visit (INDEPENDENT_AMBULATORY_CARE_PROVIDER_SITE_OTHER): Payer: Medicare HMO | Admitting: Nurse Practitioner

## 2019-04-28 VITALS — BP 131/80 | HR 83 | Temp 99.6°F | Ht 64.0 in | Wt 156.6 lb

## 2019-04-28 DIAGNOSIS — J4530 Mild persistent asthma, uncomplicated: Secondary | ICD-10-CM | POA: Diagnosis not present

## 2019-04-28 DIAGNOSIS — Z6827 Body mass index (BMI) 27.0-27.9, adult: Secondary | ICD-10-CM

## 2019-04-28 DIAGNOSIS — I10 Essential (primary) hypertension: Secondary | ICD-10-CM | POA: Diagnosis not present

## 2019-04-28 MED ORDER — ARNUITY ELLIPTA 100 MCG/ACT IN AEPB: 1.0000 | INHALATION_SPRAY | Freq: Every day | RESPIRATORY_TRACT | 6 refills | Status: DC

## 2019-04-28 MED ORDER — FLUTICASONE PROPIONATE 50 MCG/ACT NA SUSP: 2.0000 | Freq: Every day | NASAL | 5 refills | Status: DC

## 2019-04-28 MED ORDER — OLMESARTAN MEDOXOMIL-HCTZ 40-25 MG PO TABS: ORAL_TABLET | ORAL | 1 refills | Status: DC

## 2019-04-28 NOTE — Progress Notes (Signed)
Subjective:    Patient ID: Regina Knox, female    DOB: [birth year], 74 y.o.   MRN: [id number]   Chief Complaint: Medical Management of Chronic Issues    HPI:  1. Essential hypertension, benign Occasionally checks BP at home and it is good. Denies chest pain, sob, headaches. Is watching salt intake. BP Readings from Last 3 Encounters:  04/28/19 131/80  03/04/18 135/75  06/24/17 136/87     2. Mild persistent chronic asthma without complication Asthma is good right now. Has not had any flare-ups recently.  3. BMI 27.0-27.9,adult Down 5 pounds since last visit.  BMI Readings from Last 3 Encounters:  04/28/19 26.88 kg/m  03/04/18 27.64 kg/m  06/24/17 26.95 kg/m   Wt Readings from Last 3 Encounters:  04/28/19 156 lb 9.6 oz (71 kg)  03/04/18 161 lb (73 kg)  06/24/17 157 lb (71.2 kg)       Outpatient Encounter Medications as of 04/28/2019  Medication Sig  . fluticasone (FLONASE) 50 MCG/ACT nasal spray USE 2 SPRAYS IN EACH NOSTRIL DAILY  . Fluticasone Furoate (ARNUITY ELLIPTA) 100 MCG/ACT AEPB Inhale 1 Dose into the lungs daily. Take one inhalation orally daily.  Marland Kitchen olmesartan-hydrochlorothiazide (BENICAR HCT) 40-25 MG tablet TAKE ONE (1) TABLET EACH DAY   No facility-administered encounter medications on file as of 04/28/2019.    Past Surgical History:  Procedure Laterality Date  . BREAST SURGERY  80's   right breast  . ORIF ANKLE FRACTURE Right 11/06/2013   Procedure: OPEN REDUCTION INTERNAL FIXATION (ORIF) ANKLE FRACTURE;  Surgeon: Sanjuana Kava, MD;  Location: [facility];  Service: Orthopedics;  Laterality: Right;  . TUBAL LIGATION      Family History  Problem Relation Age of Onset  . Hypertension Mother   . Drug abuse Mother   . Hypertension Father   . Heart disease Father   . Cancer Sister   . Heart disease Brother     New complaints: None  Social history: Lives with husband who she takes care of.   Controlled substance contract: N/A    Review  of Systems  Constitutional: Negative.   HENT: Negative.   Eyes: Negative.   Respiratory: Negative.   Cardiovascular: Negative.   Gastrointestinal: Negative.   Endocrine: Negative.   Genitourinary: Negative.   Musculoskeletal: Negative.   Skin: Negative.   Neurological: Negative.   Psychiatric/Behavioral: Negative.        Objective:   Physical Exam Constitutional:      Appearance: Normal appearance.  HENT:     Head: Normocephalic.     Right Ear: There is impacted cerumen.     Left Ear: There is impacted cerumen.     Ears:     Comments: Using debrox gtts at home    Nose: Nose normal.     Mouth/Throat:     Mouth: Mucous membranes are moist.     Pharynx: Oropharynx is clear.  Eyes:     Conjunctiva/sclera: Conjunctivae normal.  Cardiovascular:     Rate and Rhythm: Normal rate and regular rhythm.     Pulses: Normal pulses.     Heart sounds: Normal heart sounds.  Pulmonary:     Effort: Pulmonary effort is normal.     Breath sounds: Normal breath sounds.  Abdominal:     General: Bowel sounds are normal.     Palpations: Abdomen is soft.  Musculoskeletal:        General: Normal range of motion.     Cervical back: Normal range of  motion.  Skin:    General: Skin is warm and dry.     Capillary Refill: Capillary refill takes less than 2 seconds.  Neurological:     Mental Status: She is alert and oriented to person, place, and time.  Psychiatric:        Mood and Affect: Mood normal.        Behavior: Behavior normal.      BP 131/80   Pulse 83   Temp 99.6 F (37.6 C) (Temporal)   Ht '5\' 4"'  (1.626 m)   Wt 156 lb 9.6 oz (71 kg)   LMP 05/25/1996   SpO2 96%   BMI 26.88 kg/m       Assessment & Plan:  Regina Knox comes in today with chief complaint of Medical Management of Chronic Issues   Diagnosis and orders addressed:  1. Essential hypertension, benign Low sodium diet - olmesartan-hydrochlorothiazide (BENICAR HCT) 40-25 MG tablet; TAKE ONE (1) TABLET EACH  DAY  Dispense: 90 tablet; Refill: 1 - CMP14+EGFR - CBC with Differential/Platelet - Lipid panel  2. Mild persistent chronic asthma without complication Fluticasone inhaler daily  3. BMI 27.0-27.9,adult Discussed diet and exercise for person with BMI >25 Will recheck weight in 3-6 months    Labs pending Health Maintenance reviewed Diet and exercise encouraged  Follow up plan: 6 months   Regina Knox Done, FNP

## 2019-04-28 NOTE — Addendum Note (Signed)
Addended by: Chevis Pretty on: 04/28/2019 03:25 PM   Modules accepted: Orders

## 2019-04-29 LAB — CMP14+EGFR
ALT: 18 IU/L (ref 0–32)
AST: 20 IU/L (ref 0–40)
Albumin/Globulin Ratio: 1.7 (ref 1.2–2.2)
Albumin: 4.3 g/dL (ref 3.7–4.7)
Alkaline Phosphatase: 81 IU/L (ref 39–117)
BUN/Creatinine Ratio: 20 (ref 12–28)
BUN: 18 mg/dL (ref 8–27)
Bilirubin Total: 0.4 mg/dL (ref 0.0–1.2)
CO2: 25 mmol/L (ref 20–29)
Calcium: 9.7 mg/dL (ref 8.7–10.3)
Chloride: 100 mmol/L (ref 96–106)
Creatinine, Ser: 0.91 mg/dL (ref 0.57–1.00)
GFR calc Af Amer: 71 mL/min/{1.73_m2} (ref >59)
GFR calc non Af Amer: 62 mL/min/{1.73_m2} (ref >59)
Globulin, Total: 2.5 g/dL (ref 1.5–4.5)
Glucose: 95 mg/dL (ref 65–99)
Potassium: 4 mmol/L (ref 3.5–5.2)
Sodium: 141 mmol/L (ref 134–144)
Total Protein: 6.8 g/dL (ref 6.0–8.5)

## 2019-04-29 LAB — CBC WITH DIFFERENTIAL/PLATELET
Basophils Absolute: 0.1 10*3/uL (ref 0.0–0.2)
Basos: 1 %
EOS (ABSOLUTE): 0.5 10*3/uL — ABNORMAL HIGH (ref 0.0–0.4)
Eos: 6 %
Hematocrit: 45.7 % (ref 34.0–46.6)
Hemoglobin: 15.6 g/dL (ref 11.1–15.9)
Immature Grans (Abs): 0 10*3/uL (ref 0.0–0.1)
Immature Granulocytes: 0 %
Lymphocytes Absolute: 2.2 10*3/uL (ref 0.7–3.1)
Lymphs: 30 %
MCH: 31.8 pg (ref 26.6–33.0)
MCHC: 34.1 g/dL (ref 31.5–35.7)
MCV: 93 fL (ref 79–97)
Monocytes Absolute: 0.6 10*3/uL (ref 0.1–0.9)
Monocytes: 8 %
Neutrophils Absolute: 4.1 10*3/uL (ref 1.4–7.0)
Neutrophils: 55 %
Platelets: 275 10*3/uL (ref 150–450)
RBC: 4.91 x10E6/uL (ref 3.77–5.28)
RDW: 12.8 % (ref 11.7–15.4)
WBC: 7.5 10*3/uL (ref 3.4–10.8)

## 2019-04-29 LAB — LIPID PANEL
Chol/HDL Ratio: 3.4 ratio (ref 0.0–4.4)
Cholesterol, Total: 211 mg/dL — ABNORMAL HIGH (ref 100–199)
HDL: 62 mg/dL (ref >39)
LDL Chol Calc (NIH): 125 mg/dL — ABNORMAL HIGH (ref 0–99)
Triglycerides: 135 mg/dL (ref 0–149)
VLDL Cholesterol Cal: 24 mg/dL (ref 5–40)

## 2019-05-09 ENCOUNTER — Ambulatory Visit (INDEPENDENT_AMBULATORY_CARE_PROVIDER_SITE_OTHER): Payer: Medicare HMO

## 2019-05-09 DIAGNOSIS — Z0000 Encounter for general adult medical examination without abnormal findings: Secondary | ICD-10-CM

## 2019-05-09 NOTE — Progress Notes (Signed)
MEDICARE ANNUAL WELLNESS VISIT  05/09/2019  Telephone Visit Disclaimer This Medicare AWV was conducted by telephone due to national recommendations for restrictions regarding the COVID-19 Pandemic (e.g. social distancing).  I verified, using two identifiers, that I am speaking with Regina Knox or their authorized healthcare agent. I discussed the limitations, risks, security, and privacy concerns of performing an evaluation and management service by telephone and the potential availability of an in-person appointment in the future. The patient expressed understanding and agreed to proceed.   Subjective:  Regina Knox is a 74 y.o. female patient of Chevis Pretty, [place] who had a Medicare Annual Wellness Visit today via telephone. Regina Knox is Retired and lives with her spouse. She has 1 child, a son who lives in [place].. 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  She  is minimally physically active and enjoys working crossword puzzles and playing games on the computer.  Patient Care Team: Chevis Pretty, FNP as PCP - General (Nurse Practitioner)  Advanced Directives 05/09/2019 01/31/2014 11/06/2013 11/06/2013 11/05/2013  Does Patient Have a Medical Advance Directive? No No No No No  Would patient like information on creating a medical advance directive? No - Patient declined Yes - Scientist, clinical (histocompatibility and immunogenetics) given - No - patient declined information No - patient declined information    Hospital Utilization Over the Past 12 Months: # of hospitalizations or ER visits: 0 # of surgeries: 0  Review of Systems    Patient reports that her overall health is unchanged compared to last year.  History obtained from chart review and the patient  Patient Reported Readings (BP, Pulse, CBG, Weight, etc) none  Pain Assessment Pain : No/denies pain     Current  Medications & Allergies (verified) Allergies as of 05/09/2019      Reactions   Ibuprofen Rash      Medication List       Accurate as of May 09, 2019  8:48 AM. If you have any questions, ask your nurse or doctor.        Arnuity Ellipta 100 MCG/ACT Aepb Generic drug: Fluticasone Furoate Inhale 1 Dose into the lungs daily. Take one inhalation orally daily.   fluticasone 50 MCG/ACT nasal spray Commonly known as: FLONASE Place 2 sprays into both nostrils daily.   olmesartan-hydrochlorothiazide 40-25 MG tablet Commonly known as: Benicar HCT TAKE ONE (1) TABLET EACH DAY       History (reviewed): Past Medical History:  Diagnosis Date  . Allergy   . Asthma   . Hypertension    Past Surgical History:  Procedure Laterality Date  . BREAST SURGERY  80's   right breast  . ORIF ANKLE FRACTURE Right 11/06/2013   Procedure: OPEN REDUCTION INTERNAL FIXATION (ORIF) ANKLE FRACTURE;  Surgeon: Sanjuana Kava, MD;  Location: [facility];  Service: Orthopedics;  Laterality: Right;  . TUBAL LIGATION     Family History  Problem Relation Age of Onset  . Hypertension Mother   . Diabetes Mother   . Hypertension Father   . Heart disease Father   . Cancer Sister        brain, breast  . Hypertension Sister   . Heart disease Brother        congestive heart failure  . Diabetes Sister   . Hypertension Sister   . Hypertension Son   . Diabetes Son   .  Diabetes Maternal Grandmother   . Diabetes Paternal Grandmother    Social History   Socioeconomic History  . Marital status: Married    Spouse name: Jori Moll  . Number of children: 1  . Years of education: 65  . Highest education level: 12th grade  Occupational History  . Occupation: Retired  Tobacco Use  . Smoking status: Never Smoker  . Smokeless tobacco: Never Used  Substance and Sexual Activity  . Alcohol use: No  . Drug use: No  . Sexual activity: Not Currently  Other Topics Concern  . Not on file  Social History Narrative  . Not  on file   Social Determinants of Health   Financial Resource Strain:   . Difficulty of Paying Living Expenses:   Food Insecurity:   . Worried About Charity fundraiser in the Last Year:   . Arboriculturist in the Last Year:   Transportation Needs:   . Film/video editor (Medical):   Marland Kitchen Lack of Transportation (Non-Medical):   Physical Activity:   . Days of Exercise per Week:   . Minutes of Exercise per Session:   Stress:   . Feeling of Stress :   Social Connections:   . Frequency of Communication with Friends and Family:   . Frequency of Social Gatherings with Friends and Family:   . Attends Religious Services:   . Active Member of Clubs or Organizations:   . Attends Archivist Meetings:   Marland Kitchen Marital Status:     Activities of Daily Living In your present state of health, do you have any difficulty performing the following activities: 05/09/2019  Hearing? N  Vision? N  Difficulty concentrating or making decisions? N  Walking or climbing stairs? N  Dressing or bathing? N  Doing errands, shopping? N  Preparing Food and eating ? N  Using the Toilet? N  In the past six months, have you accidently leaked urine? N  Do you have problems with loss of bowel control? N  Managing your Medications? N  Managing your Finances? N  Housekeeping or managing your Housekeeping? N  Some recent data might be hidden    Patient Education/ Literacy How often do you need to have someone help you when you read instructions, pamphlets, or other written materials from your doctor or pharmacy?: 1 - Never What is the last grade level you completed in school?: 12th grade  Exercise Current Exercise Habits: Home exercise routine, Type of exercise: walking, Time (Minutes): 30, Frequency (Times/Week): 4, Weekly Exercise (Minutes/Week): 120, Intensity: Mild  Diet Patient reports consuming 2 meals a day and 1 snack(s) a day Patient reports that her primary diet is: Low fat Patient reports  that she does have regular access to food.   Depression Screen PHQ 2/9 Scores 05/09/2019 04/28/2019 03/04/2018 06/24/2017 04/01/2017 09/03/2016 01/20/2016  PHQ - 2 Score 0 0 0 0 0 0 0     Fall Risk Fall Risk  05/09/2019 04/28/2019 03/04/2018 06/24/2017 04/01/2017  Falls in the past year? 0 0 0 No Yes  Number falls in past yr: - - - - 1  Injury with Fall? - - - - No  Comment - - - - -     Objective:  Regina Knox seemed alert and oriented and she participated appropriately during our telephone visit.  Blood Pressure Weight BMI  BP Readings from Last 3 Encounters:  04/28/19 131/80  03/04/18 135/75  06/24/17 136/87   Wt Readings from Last 3  Encounters:  04/28/19 156 lb 9.6 oz (71 kg)  03/04/18 161 lb (73 kg)  06/24/17 157 lb (71.2 kg)   BMI Readings from Last 1 Encounters:  04/28/19 26.88 kg/m    *Unable to obtain current vital signs, weight, and BMI due to telephone visit type  Hearing/Vision  . Regina Knox did not seem to have difficulty with hearing/understanding during the telephone conversation . Reports that she has not had a formal eye exam by an eye care professional within the past year . Reports that she has not had a formal hearing evaluation within the past year *Unable to fully assess hearing and vision during telephone visit type  Cognitive Function: 6CIT Screen 05/09/2019  What Year? 0 points  What month? 0 points  What time? 0 points  Count back from 20 0 points  Months in reverse 0 points  Repeat phrase 0 points  Total Score 0   (Normal:0-7, Significant for Dysfunction: >8)  Normal Cognitive Function Screening: Yes   Immunization & Health Maintenance Record Immunization History  Administered Date(s) Administered  . Fluad Quad(high Dose 65+) 12/19/2018  . Influenza, High Dose Seasonal PF 12/02/2012, 12/11/2016, 11/24/2017  . Influenza,inj,Quad PF,6+ Mos 11/29/2014, 02/14/2016  . Pneumococcal Conjugate-13 11/30/2014  . Pneumococcal Polysaccharide-23 11/17/2010     Health Maintenance  Topic Date Due  . Hepatitis C Screening  Never done  . TETANUS/TDAP  Never done  . DEXA SCAN  Never done  . COLON CANCER SCREENING ANNUAL FOBT  09/07/2017  . MAMMOGRAM  04/27/2020 (Originally 08/05/1963)  . COLONOSCOPY  04/27/2020 (Originally 08/05/1993)  . INFLUENZA VACCINE  Completed  . PNA vac Low Risk Adult  Completed       Assessment  This is a routine wellness examination for Regina Knox.  Health Maintenance: Due or Overdue Health Maintenance Due  Topic Date Due  . Hepatitis C Screening  Never done  . TETANUS/TDAP  Never done  . DEXA SCAN  Never done  . COLON CANCER SCREENING ANNUAL FOBT  09/07/2017    Regina Knox does not need a referral for [employer] Assistance: Care Management:   no Social Work:    no Prescription Assistance:  no Nutrition/Diabetes Education:  no   Plan:  Personalized Goals Goals Addressed   None    Personalized Health Maintenance & Screening Recommendations  Td vaccine  Lung Cancer Screening Recommended: no (Low Dose CT Chest recommended if Age 14-80 years, 30 pack-year currently smoking OR have quit w/in past 15 years) Hepatitis C Screening recommended: no HIV Screening recommended: no  Advanced Directives: Written information was not prepared per patient's request.  Referrals & Orders No orders of the defined types were placed in this encounter.   Follow-up Plan . Follow-up with Chevis Pretty, FNP as planned . Schedule mammogram . Schedule eye exam . Schedule hearing exam   I have personally reviewed and noted the following in the patient's chart:   . Medical and social history . Use of alcohol, tobacco or illicit drugs  . Current medications and supplements . Functional ability and status . Nutritional status . Physical activity . Advanced directives . List of other physicians . Hospitalizations, surgeries, and ER visits in previous 12 months . Vitals . Screenings to include  cognitive, depression, and falls . Referrals and appointments  In addition, I have reviewed and discussed with Regina Knox certain preventive protocols, quality metrics, and best practice recommendations. A written personalized care plan for preventive services as well as general preventive health  recommendations is available and can be mailed to the patient at her request.      Felicity Coyer, LPN    [phone]

## 2020-02-08 ENCOUNTER — Ambulatory Visit (INDEPENDENT_AMBULATORY_CARE_PROVIDER_SITE_OTHER): Payer: Medicare HMO | Admitting: Nurse Practitioner

## 2020-02-08 ENCOUNTER — Telehealth: Payer: Self-pay | Admitting: Nurse Practitioner

## 2020-02-08 ENCOUNTER — Encounter: Payer: Self-pay | Admitting: Nurse Practitioner

## 2020-02-08 DIAGNOSIS — J40 Bronchitis, not specified as acute or chronic: Secondary | ICD-10-CM | POA: Diagnosis not present

## 2020-02-08 DIAGNOSIS — J4530 Mild persistent asthma, uncomplicated: Secondary | ICD-10-CM | POA: Diagnosis not present

## 2020-02-08 MED ORDER — PREDNISONE 20 MG PO TABS: ORAL_TABLET | ORAL | 0 refills | Status: DC

## 2020-02-08 MED ORDER — ALBUTEROL SULFATE HFA 108 (90 BASE) MCG/ACT IN AERS: 2.0000 | INHALATION_SPRAY | Freq: Four times a day (QID) | RESPIRATORY_TRACT | 0 refills | Status: DC | PRN

## 2020-02-08 MED ORDER — AZITHROMYCIN 250 MG PO TABS: ORAL_TABLET | ORAL | 0 refills | Status: DC

## 2020-02-08 MED ORDER — ARNUITY ELLIPTA 100 MCG/ACT IN AEPB: 1.0000 | INHALATION_SPRAY | Freq: Every day | RESPIRATORY_TRACT | 6 refills | Status: DC

## 2020-02-08 NOTE — Progress Notes (Signed)
Virtual Visit via telephone Note Due to COVID-19 pandemic this visit was conducted virtually. This visit type was conducted due to national recommendations for restrictions regarding the COVID-19 Pandemic (e.g. social distancing, sheltering in place) in an effort to limit this patient's exposure and mitigate transmission in our community. All issues noted in this document were discussed and addressed.  A physical exam was not performed with this format.  I connected with Regina Knox on 02/08/20 at 9:30 by telephone and verified that I am speaking with the correct person using two identifiers. Regina Knox is currently located at home and no one is currently with  her during visit. The provider, Mary-Margaret Hassell Done, FNP is located in their office at time of visit.  I discussed the limitations, risks, security and privacy concerns of performing an evaluation and management service by telephone and the availability of in person appointments. I also discussed with the patient that there may be a patient responsible charge related to this service. The patient expressed understanding and agreed to proceed.   History and Present Illness:   Chief Complaint: URI   HPI Patient has had a cold for several days. She has started wheezing which is keeping her up at night. She was tested for covid yesterday and was negative.    Review of Systems  Constitutional: Negative for chills and fever.  HENT: Positive for congestion. Negative for sore throat.   Respiratory: Positive for cough, sputum production and wheezing. Negative for shortness of breath.   Neurological: Positive for headaches. Negative for dizziness.  Psychiatric/Behavioral: Negative.   All other systems reviewed and are negative.    Observations/Objective: Alert and oriented- answers all questions appropriately No distress Voice hoarse Deep wet cough noted   Assessment and Plan: Regina Knox in today with chief complaint  of URI   1. Bronchitis 1. Take meds as prescribed 2. Use a cool mist humidifier especially during the winter months and when heat has been humid. 3. Use saline nose sprays frequently 4. Saline irrigations of the nose can be very helpful if done frequently.  * 4X daily for 1 week*  * Use of a nettie pot can be helpful with this. Follow directions with this* 5. Drink plenty of fluids 6. Keep thermostat turn down low 7.For any cough or congestion  Use plain Mucinex- regular strength or max strength is fine   * Children- consult with Pharmacist for dosing 8. For fever or aces or pains- take tylenol or ibuprofen appropriate for age and weight.  * for fevers greater than 101 orally you may alternate ibuprofen and tylenol every  3 hours.    - azithromycin (ZITHROMAX Z-PAK) 250 MG tablet; As directed  Dispense: 6 tablet; Refill: 0 - predniSONE (DELTASONE) 20 MG tablet; 2 po at sametime daily for 5 days  Dispense: 10 tablet; Refill: 0 - albuterol (VENTOLIN HFA) 108 (90 Base) MCG/ACT inhaler; Inhale 2 puffs into the lungs every 6 (six) hours as needed for wheezing or shortness of breath.  Dispense: 8 g; Refill: 0  2. Mild persistent chronic asthma without complication - Fluticasone Furoate (ARNUITY ELLIPTA) 100 MCG/ACT AEPB; Inhale 1 Dose into the lungs daily. Take one inhalation orally daily.  Dispense: 30 each; Refill: 6      Follow Up Instructions: prn    I discussed the assessment and treatment plan with the patient. The patient was provided an opportunity to ask questions and all were answered. The patient agreed with the plan and  demonstrated an understanding of the instructions.   The patient was advised to call back or seek an in-person evaluation if the symptoms worsen or if the condition fails to improve as anticipated.  The above assessment and management plan was discussed with the patient. The patient verbalized understanding of and has agreed to the management plan. Patient  is aware to call the clinic if symptoms persist or worsen. Patient is aware when to return to the clinic for a follow-up visit. Patient educated on when it is appropriate to go to the emergency d epartment.   Time call ended:  9:46  I provided 16 minutes of non-face-to-face time during this encounter.    Mary-Margaret Hassell Done, FNP

## 2020-02-08 NOTE — Telephone Encounter (Signed)
Pt would like to be called by [facility] nurse. She is having cold symptoms and said they will turn into asthma if she does not have medicine. Tried to give pt appt for Monday but she stated that would be too late.

## 2020-02-08 NOTE — Telephone Encounter (Signed)
Appointment scheduled.

## 2020-08-08 ENCOUNTER — Telehealth: Payer: Self-pay | Admitting: Nurse Practitioner

## 2020-08-08 DIAGNOSIS — I10 Essential (primary) hypertension: Secondary | ICD-10-CM

## 2020-08-08 NOTE — Telephone Encounter (Signed)
  Prescription Request  08/08/2020  What is the name of the medication or equipment? benicar  Have you contacted your pharmacy to request a refill? (if applicable) yes  Which pharmacy would you like this sent to? The drug store in [place]   Patient notified that their request is being sent to the clinical staff for review and that they should receive a response within 2 business days.

## 2020-08-09 MED ORDER — OLMESARTAN MEDOXOMIL-HCTZ 40-25 MG PO TABS: ORAL_TABLET | ORAL | 0 refills | Status: DC

## 2020-08-09 NOTE — Telephone Encounter (Signed)
Pt aware refill sent to the [facility] Store

## 2020-08-10 ENCOUNTER — Other Ambulatory Visit: Payer: Self-pay | Admitting: Nurse Practitioner

## 2020-08-10 DIAGNOSIS — I10 Essential (primary) hypertension: Secondary | ICD-10-CM

## 2020-08-22 ENCOUNTER — Encounter: Payer: Self-pay | Admitting: Nurse Practitioner

## 2020-08-22 ENCOUNTER — Ambulatory Visit (INDEPENDENT_AMBULATORY_CARE_PROVIDER_SITE_OTHER): Payer: Medicare HMO | Admitting: Nurse Practitioner

## 2020-08-22 ENCOUNTER — Other Ambulatory Visit: Payer: Self-pay

## 2020-08-22 VITALS — BP 120/77 | HR 87 | Temp 97.2°F | Resp 20 | Ht 64.0 in | Wt 153.0 lb

## 2020-08-22 DIAGNOSIS — Z6827 Body mass index (BMI) 27.0-27.9, adult: Secondary | ICD-10-CM

## 2020-08-22 DIAGNOSIS — J4530 Mild persistent asthma, uncomplicated: Secondary | ICD-10-CM | POA: Diagnosis not present

## 2020-08-22 DIAGNOSIS — I10 Essential (primary) hypertension: Secondary | ICD-10-CM

## 2020-08-22 MED ORDER — OLMESARTAN MEDOXOMIL-HCTZ 40-25 MG PO TABS: ORAL_TABLET | ORAL | 1 refills | Status: DC

## 2020-08-22 MED ORDER — ARNUITY ELLIPTA 100 MCG/ACT IN AEPB: 1.0000 | INHALATION_SPRAY | Freq: Every day | RESPIRATORY_TRACT | 6 refills | Status: DC

## 2020-08-22 MED ORDER — FLUTICASONE PROPIONATE 50 MCG/ACT NA SUSP: 2.0000 | Freq: Every day | NASAL | 5 refills | Status: DC

## 2020-08-22 NOTE — Addendum Note (Signed)
Addended by: Chevis Pretty on: 08/22/2020 12:38 PM   Modules accepted: Orders

## 2020-08-22 NOTE — Progress Notes (Signed)
Subjective:    Patient ID: Regina Knox, female    DOB: [birth year], 75 y.o.   MRN: [id number]   Chief Complaint: Medical Management of Chronic Issues    HPI:  1. Essential hypertension, benign No c/o chest pain, sob or headache. Does not check blood pressure at home. BP Readings from Last 3 Encounters:  08/22/20 120/77  04/28/19 131/80  03/04/18 135/75      2. Mild persistent chronic asthma without complication No sob  and no wheezing  3. BMI 27.0-27.9,adult No recent weight changes Wt Readings from Last 3 Encounters:  08/22/20 153 lb (69.4 kg)  04/28/19 156 lb 9.6 oz (71 kg)  03/04/18 161 lb (73 kg)   BMI Readings from Last 3 Encounters:  08/22/20 26.26 kg/m  04/28/19 26.88 kg/m  03/04/18 27.64 kg/m       Outpatient Encounter Medications as of 08/22/2020  Medication Sig   fluticasone (FLONASE) 50 MCG/ACT nasal spray Place 2 sprays into both nostrils daily.   Fluticasone Furoate (ARNUITY ELLIPTA) 100 MCG/ACT AEPB Inhale 1 Dose into the lungs daily. Take one inhalation orally daily.   olmesartan-hydrochlorothiazide (BENICAR HCT) 40-25 MG tablet TAKE ONE (1) TABLET EACH DAY   [DISCONTINUED] albuterol (VENTOLIN HFA) 108 (90 Base) MCG/ACT inhaler Inhale 2 puffs into the lungs every 6 (six) hours as needed for wheezing or shortness of breath.   [DISCONTINUED] azithromycin (ZITHROMAX Z-PAK) 250 MG tablet As directed   [DISCONTINUED] predniSONE (DELTASONE) 20 MG tablet 2 po at sametime daily for 5 days   No facility-administered encounter medications on file as of 08/22/2020.    Past Surgical History:  Procedure Laterality Date   BREAST SURGERY  80's   right breast   ORIF ANKLE FRACTURE Right 11/06/2013   Procedure: OPEN REDUCTION INTERNAL FIXATION (ORIF) ANKLE FRACTURE;  Surgeon: Sanjuana Kava, MD;  Location: [facility];  Service: Orthopedics;  Laterality: Right;   TUBAL LIGATION      Family History  Problem Relation Age of Onset   Hypertension Mother     Diabetes Mother    Hypertension Father    Heart disease Father    Cancer Sister        brain, breast   Hypertension Sister    Heart disease Brother        congestive heart failure   Diabetes Sister    Hypertension Sister    Hypertension Son    Diabetes Son    Diabetes Maternal Grandmother    Diabetes Paternal Grandmother     New complaints: None today  Social history: Lives by herself. She has family that checks on her regularly.  Controlled substance contract: n/a     Review of Systems  Constitutional:  Negative for diaphoresis.  Eyes:  Negative for pain.  Respiratory:  Negative for shortness of breath.   Cardiovascular:  Negative for chest pain, palpitations and leg swelling.  Gastrointestinal:  Negative for abdominal pain.  Endocrine: Negative for polydipsia.  Skin:  Negative for rash.  Neurological:  Negative for dizziness, weakness and headaches.  Hematological:  Does not bruise/bleed easily.  All other systems reviewed and are negative.     Objective:   Physical Exam Vitals and nursing note reviewed.  Constitutional:      General: She is not in acute distress.    Appearance: Normal appearance. She is well-developed.  HENT:     Head: Normocephalic.     Right Ear: Tympanic membrane normal.     Left Ear: Tympanic membrane normal.  Nose: Nose normal.     Mouth/Throat:     Mouth: Mucous membranes are moist.  Eyes:     Pupils: Pupils are equal, round, and reactive to light.  Neck:     Vascular: No carotid bruit or JVD.  Cardiovascular:     Rate and Rhythm: Normal rate and regular rhythm.     Heart sounds: Normal heart sounds.  Pulmonary:     Effort: Pulmonary effort is normal. No respiratory distress.     Breath sounds: Normal breath sounds. No wheezing or rales.  Chest:     Chest wall: No tenderness.  Abdominal:     General: Bowel sounds are normal. There is no distension or abdominal bruit.     Palpations: Abdomen is soft. There is no  hepatomegaly, splenomegaly, mass or pulsatile mass.     Tenderness: There is no abdominal tenderness.  Musculoskeletal:        General: Normal range of motion.     Cervical back: Normal range of motion and neck supple.  Lymphadenopathy:     Cervical: No cervical adenopathy.  Skin:    General: Skin is warm and dry.  Neurological:     Mental Status: She is alert and oriented to person, place, and time.     Deep Tendon Reflexes: Reflexes are normal and symmetric.  Psychiatric:        Behavior: Behavior normal.        Thought Content: Thought content normal.        Judgment: Judgment normal.   BP 120/77   Pulse 87   Temp (!) 97.2 F (36.2 C) (Temporal)   Resp 20   Ht _0  (1.626 m)   Wt 153 lb (69.4 kg)   LMP 05/25/1996   SpO2 98%   BMI 26.26 kg/m         Assessment & Plan:   Regina Knox comes in today with chief complaint of Medical Management of Chronic Issues   Diagnosis and orders addressed:  1. Essential hypertension, benign Low sodium diet - olmesartan-hydrochlorothiazide (BENICAR HCT) 40-25 MG tablet; TAKE ONE (1) TABLET EACH DAY  Dispense: 90 tablet; Refill: 1 - CBC with Differential/Platelet - CMP14+EGFR - Lipid panel  2. Mild persistent chronic asthma without complication - Fluticasone Furoate (ARNUITY ELLIPTA) 100 MCG/ACT AEPB; Inhale 1 Dose into the lungs daily. Take one inhalation orally daily.  Dispense: 30 each; Refill: 6  3. BMI 27.0-27.9,adult Discussed diet and exercise for person with BMI >25 Will recheck weight in 3-6 months    Labs pending Health Maintenance reviewed Diet and exercise encouraged  Follow up plan: 6 months   Mary-Margaret Hassell Done, FNP

## 2020-08-22 NOTE — Patient Instructions (Signed)
https://www.nhlbi.nih.gov/files/docs/public/heart/dash_brief.pdf">  DASH Eating Plan DASH stands for Dietary Approaches to Stop Hypertension. The DASH eating plan is a healthy eating plan that has been shown to: Reduce high blood pressure (hypertension). Reduce your risk for type 2 diabetes, heart disease, and stroke. Help with weight loss. What are tips for following this plan? Reading food labels Check food labels for the amount of salt (sodium) per serving. Choose foods with less than 5 percent of the Daily Value of sodium. Generally, foods with less than 300 milligrams (mg) of sodium per serving fit into this eating plan. To find whole grains, look for the word "whole" as the first word in the ingredient list. Shopping Buy products labeled as "low-sodium" or "no salt added." Buy fresh foods. Avoid canned foods and pre-made or frozen meals. Cooking Avoid adding salt when cooking. Use salt-free seasonings or herbs instead of table salt or sea salt. Check with your health care provider or pharmacist before using salt substitutes. Do not fry foods. Cook foods using healthy methods such as baking, boiling, grilling, roasting, and broiling instead. Cook with heart-healthy oils, such as olive, canola, avocado, soybean, or sunflower oil. Meal planning  Eat a balanced diet that includes: 4 or more servings of fruits and 4 or more servings of vegetables each day. Try to fill one-half of your plate with fruits and vegetables. 6-8 servings of whole grains each day. Less than 6 oz (170 g) of lean meat, poultry, or fish each day. A 3-oz (85-g) serving of meat is about the same size as a deck of cards. One egg equals 1 oz (28 g). 2-3 servings of low-fat dairy each day. One serving is 1 cup (237 mL). 1 serving of nuts, seeds, or beans 5 times each week. 2-3 servings of heart-healthy fats. Healthy fats called omega-3 fatty acids are found in foods such as walnuts, flaxseeds, fortified milks, and eggs.  These fats are also found in cold-water fish, such as sardines, salmon, and mackerel. Limit how much you eat of: Canned or prepackaged foods. Food that is high in trans fat, such as some fried foods. Food that is high in saturated fat, such as fatty meat. Desserts and other sweets, sugary drinks, and other foods with added sugar. Full-fat dairy products. Do not salt foods before eating. Do not eat more than 4 egg yolks a week. Try to eat at least 2 vegetarian meals a week. Eat more home-cooked food and less restaurant, buffet, and fast food.  Lifestyle When eating at a restaurant, ask that your food be prepared with less salt or no salt, if possible. If you drink alcohol: Limit how much you use to: 0-1 drink a day for women who are not pregnant. 0-2 drinks a day for men. Be aware of how much alcohol is in your drink. In the [place] one drink equals one 12 oz bottle of beer (355 mL), one 5 oz glass of wine (148 mL), or one 1 oz glass of hard liquor (44 mL). General information Avoid eating more than 2,300 mg of salt a day. If you have hypertension, you may need to reduce your sodium intake to 1,500 mg a day. Work with your health care provider to maintain a healthy body weight or to lose weight. Ask what an ideal weight is for you. Get at least 30 minutes of exercise that causes your heart to beat faster (aerobic exercise) most days of the week. Activities may include walking, swimming, or biking. Work with your health care provider   or dietitian to adjust your eating plan to your individual calorie needs. What foods should I eat? Fruits All fresh, dried, or frozen fruit. Canned fruit in natural juice (without addedsugar). Vegetables Fresh or frozen vegetables (raw, steamed, roasted, or grilled). Low-sodium or reduced-sodium tomato and vegetable juice. Low-sodium or reduced-sodium tomatosauce and tomato paste. Low-sodium or reduced-sodium canned vegetables. Grains Whole-grain or  whole-wheat bread. Whole-grain or whole-wheat pasta. Brown rice. Oatmeal. Quinoa. Bulgur. Whole-grain and low-sodium cereals. Pita bread.Low-fat, low-sodium crackers. Whole-wheat flour tortillas. Meats and other proteins Skinless chicken or turkey. Ground chicken or turkey. Pork with fat trimmed off. Fish and seafood. Egg whites. Dried beans, peas, or lentils. Unsalted nuts, nut butters, and seeds. Unsalted canned beans. Lean cuts of beef with fat trimmed off. Low-sodium, lean precooked or cured meat, such as sausages or meatloaves. Dairy Low-fat (1%) or fat-free (skim) milk. Reduced-fat, low-fat, or fat-free cheeses. Nonfat, low-sodium ricotta or cottage cheese. Low-fat or nonfatyogurt. Low-fat, low-sodium cheese. Fats and oils Soft margarine without trans fats. Vegetable oil. Reduced-fat, low-fat, or light mayonnaise and salad dressings (reduced-sodium). Canola, safflower, olive, avocado, soybean, andsunflower oils. Avocado. Seasonings and condiments Herbs. Spices. Seasoning mixes without salt. Other foods Unsalted popcorn and pretzels. Fat-free sweets. The items listed above may not be a complete list of foods and beverages you can eat. Contact a dietitian for more information. What foods should I avoid? Fruits Canned fruit in a light or heavy syrup. Fried fruit. Fruit in cream or buttersauce. Vegetables Creamed or fried vegetables. Vegetables in a cheese sauce. Regular canned vegetables (not low-sodium or reduced-sodium). Regular canned tomato sauce and paste (not low-sodium or reduced-sodium). Regular tomato and vegetable juice(not low-sodium or reduced-sodium). Pickles. Olives. Grains Baked goods made with fat, such as croissants, muffins, or some breads. Drypasta or rice meal packs. Meats and other proteins Fatty cuts of meat. Ribs. Fried meat. Bacon. Bologna, salami, and other precooked or cured meats, such as sausages or meat loaves. Fat from the back of a pig (fatback). Bratwurst.  Salted nuts and seeds. Canned beans with added salt. Canned orsmoked fish. Whole eggs or egg yolks. Chicken or turkey with skin. Dairy Whole or 2% milk, cream, and half-and-half. Whole or full-fat cream cheese. Whole-fat or sweetened yogurt. Full-fat cheese. Nondairy creamers. Whippedtoppings. Processed cheese and cheese spreads. Fats and oils Butter. Stick margarine. Lard. Shortening. Ghee. Bacon fat. Tropical oils, suchas coconut, palm kernel, or palm oil. Seasonings and condiments Onion salt, garlic salt, seasoned salt, table salt, and sea salt. Worcestershire sauce. Tartar sauce. Barbecue sauce. Teriyaki sauce. Soy sauce, including reduced-sodium. Steak sauce. Canned and packaged gravies. Fish sauce. Oyster sauce. Cocktail sauce. Store-bought horseradish. Ketchup. Mustard. Meat flavorings and tenderizers. Bouillon cubes. Hot sauces. Pre-made or packaged marinades. Pre-made or packaged taco seasonings. Relishes. Regular saladdressings. Other foods Salted popcorn and pretzels. The items listed above may not be a complete list of foods and beverages you should avoid. Contact a dietitian for more information. Where to find more information National Heart, Lung, and Blood Institute: www.nhlbi.nih.gov American Heart Association: www.heart.org Academy of Nutrition and Dietetics: www.eatright.org National Kidney Foundation: www.kidney.org Summary The DASH eating plan is a healthy eating plan that has been shown to reduce high blood pressure (hypertension). It may also reduce your risk for type 2 diabetes, heart disease, and stroke. When on the DASH eating plan, aim to eat more fresh fruits and vegetables, whole grains, lean proteins, low-fat dairy, and heart-healthy fats. With the DASH eating plan, you should limit salt (sodium) intake to 2,300   mg a day. If you have hypertension, you may need to reduce your sodium intake to 1,500 mg a day. Work with your health care provider or dietitian to adjust  your eating plan to your individual calorie needs. This information is not intended to replace advice given to you by your health care provider. Make sure you discuss any questions you have with your healthcare provider. Document Revised: 01/06/2019 Document Reviewed: 01/06/2019 Elsevier Patient Education  2022 [employer]  

## 2020-08-23 LAB — CBC WITH DIFFERENTIAL/PLATELET
Basophils Absolute: 0.1 10*3/uL (ref 0.0–0.2)
Basos: 1 %
EOS (ABSOLUTE): 0.5 10*3/uL — ABNORMAL HIGH (ref 0.0–0.4)
Eos: 7 %
Hematocrit: 41.5 % (ref 34.0–46.6)
Hemoglobin: 13.4 g/dL (ref 11.1–15.9)
Immature Grans (Abs): 0 10*3/uL (ref 0.0–0.1)
Immature Granulocytes: 0 %
Lymphocytes Absolute: 2 10*3/uL (ref 0.7–3.1)
Lymphs: 25 %
MCH: 30.2 pg (ref 26.6–33.0)
MCHC: 32.3 g/dL (ref 31.5–35.7)
MCV: 94 fL (ref 79–97)
Monocytes Absolute: 0.6 10*3/uL (ref 0.1–0.9)
Monocytes: 8 %
Neutrophils Absolute: 4.9 10*3/uL (ref 1.4–7.0)
Neutrophils: 59 %
Platelets: 306 10*3/uL (ref 150–450)
RBC: 4.43 x10E6/uL (ref 3.77–5.28)
RDW: 12.4 % (ref 11.7–15.4)
WBC: 8.1 10*3/uL (ref 3.4–10.8)

## 2020-08-23 LAB — CMP14+EGFR
ALT: 14 IU/L (ref 0–32)
AST: 18 IU/L (ref 0–40)
Albumin/Globulin Ratio: 1.6 (ref 1.2–2.2)
Albumin: 4.1 g/dL (ref 3.7–4.7)
Alkaline Phosphatase: 98 IU/L (ref 44–121)
BUN/Creatinine Ratio: 14 (ref 12–28)
BUN: 11 mg/dL (ref 8–27)
Bilirubin Total: 0.2 mg/dL (ref 0.0–1.2)
CO2: 28 mmol/L (ref 20–29)
Calcium: 9.3 mg/dL (ref 8.7–10.3)
Chloride: 103 mmol/L (ref 96–106)
Creatinine, Ser: 0.79 mg/dL (ref 0.57–1.00)
Globulin, Total: 2.6 g/dL (ref 1.5–4.5)
Glucose: 106 mg/dL — ABNORMAL HIGH (ref 65–99)
Potassium: 4.8 mmol/L (ref 3.5–5.2)
Sodium: 145 mmol/L — ABNORMAL HIGH (ref 134–144)
Total Protein: 6.7 g/dL (ref 6.0–8.5)
eGFR: 77 mL/min/{1.73_m2} (ref >59)

## 2020-08-23 LAB — LIPID PANEL
Chol/HDL Ratio: 3.4 ratio (ref 0.0–4.4)
Cholesterol, Total: 195 mg/dL (ref 100–199)
HDL: 58 mg/dL (ref >39)
LDL Chol Calc (NIH): 120 mg/dL — ABNORMAL HIGH (ref 0–99)
Triglycerides: 94 mg/dL (ref 0–149)
VLDL Cholesterol Cal: 17 mg/dL (ref 5–40)

## 2020-10-24 ENCOUNTER — Ambulatory Visit: Payer: Medicare HMO

## 2020-10-28 ENCOUNTER — Ambulatory Visit (INDEPENDENT_AMBULATORY_CARE_PROVIDER_SITE_OTHER): Payer: Medicare HMO

## 2020-10-28 VITALS — Ht 64.0 in | Wt 149.0 lb

## 2020-10-28 DIAGNOSIS — Z0000 Encounter for general adult medical examination without abnormal findings: Secondary | ICD-10-CM | POA: Diagnosis not present

## 2020-10-28 NOTE — Patient Instructions (Signed)
Regina Knox , Thank you for taking time to come for your Medicare Wellness Visit. I appreciate your ongoing commitment to your health goals. Please review the following plan we discussed and let me know if I can assist you in the future.   Screening recommendations/referrals: Colonoscopy: Declined Mammogram: Declined Bone Density: Declined Recommended yearly ophthalmology/optometry visit for glaucoma screening and checkup Recommended yearly dental visit for hygiene and checkup  Vaccinations: Influenza vaccine: Due every fall Pneumococcal vaccine: Done 11/17/2010 & 11/30/2014 Tdap vaccine: Due. Every 10 years Shingles vaccine: Due. Shingrix discussed. Please contact your pharmacy for coverage information.     Covid-19: Done 10/07/2019, 11/04/2019, & 05/27/2020  Advanced directives: Advance directive discussed with you today. Even though you declined this today, please call our office should you change your mind, and we can give you the proper paperwork for you to fill out.   Conditions/risks identified: Aim for 30 minutes of exercise or brisk walking each day, drink 6-8 glasses of water and eat lots of fruits and vegetables.   Next appointment: Follow up in one year for your annual wellness visit    Preventive Care 65 Years and Older, Female Preventive care refers to lifestyle choices and visits with your health care provider that can promote health and wellness. What does preventive care include? A yearly physical exam. This is also called an annual well check. Dental exams once or twice a year. Routine eye exams. Ask your health care provider how often you should have your eyes checked. Personal lifestyle choices, including: Daily care of your teeth and gums. Regular physical activity. Eating a healthy diet. Avoiding tobacco and drug use. Limiting alcohol use. Practicing safe sex. Taking low-dose aspirin every day. Taking vitamin and mineral supplements as recommended by your health  care provider. What happens during an annual well check? The services and screenings done by your health care provider during your annual well check will depend on your age, overall health, lifestyle risk factors, and family history of disease. Counseling  Your health care provider may ask you questions about your: Alcohol use. Tobacco use. Drug use. Emotional well-being. Home and relationship well-being. Sexual activity. Eating habits. History of falls. Memory and ability to understand (cognition). Work and work Statistician. Reproductive health. Screening  You may have the following tests or measurements: Height, weight, and BMI. Blood pressure. Lipid and cholesterol levels. These may be checked every 5 years, or more frequently if you are over 2 years old. Skin check. Lung cancer screening. You may have this screening every year starting at age 27 if you have a 30-pack-year history of smoking and currently smoke or have quit within the past 15 years. Fecal occult blood test (FOBT) of the stool. You may have this test every year starting at age 58. Flexible sigmoidoscopy or colonoscopy. You may have a sigmoidoscopy every 5 years or a colonoscopy every 10 years starting at age 81. Hepatitis C blood test. Hepatitis B blood test. Sexually transmitted disease (STD) testing. Diabetes screening. This is done by checking your blood sugar (glucose) after you have not eaten for a while (fasting). You may have this done every 1-3 years. Bone density scan. This is done to screen for osteoporosis. You may have this done starting at age 34. Mammogram. This may be done every 1-2 years. Talk to your health care provider about how often you should have regular mammograms. Talk with your health care provider about your test results, treatment options, and if necessary, the need for more  tests. Vaccines  Your health care provider may recommend certain vaccines, such as: Influenza vaccine. This is  recommended every year. Tetanus, diphtheria, and acellular pertussis (Tdap, Td) vaccine. You may need a Td booster every 10 years. Zoster vaccine. You may need this after age 97. Pneumococcal 13-valent conjugate (PCV13) vaccine. One dose is recommended after age 93. Pneumococcal polysaccharide (PPSV23) vaccine. One dose is recommended after age 83. Talk to your health care provider about which screenings and vaccines you need and how often you need them. This information is not intended to replace advice given to you by your health care provider. Make sure you discuss any questions you have with your health care provider. Document Released: 03/01/2015 Document Revised: 10/23/2015 Document Reviewed: 12/04/2014 Elsevier Interactive Patient Education  2017 [place] Prevention in the Home Falls can cause injuries. They can happen to people of all ages. There are many things you can do to make your home safe and to help prevent falls. What can I do on the outside of my home? Regularly fix the edges of walkways and driveways and fix any cracks. Remove anything that might make you trip as you walk through a door, such as a raised step or threshold. Trim any bushes or trees on the path to your home. Use bright outdoor lighting. Clear any walking paths of anything that might make someone trip, such as rocks or tools. Regularly check to see if handrails are loose or broken. Make sure that both sides of any steps have handrails. Any raised decks and porches should have guardrails on the edges. Have any leaves, snow, or ice cleared regularly. Use sand or salt on walking paths during winter. Clean up any spills in your garage right away. This includes oil or grease spills. What can I do in the bathroom? Use night lights. Install grab bars by the toilet and in the tub and shower. Do not use towel bars as grab bars. Use non-skid mats or decals in the tub or shower. If you need to sit down in  the shower, use a plastic, non-slip stool. Keep the floor dry. Clean up any water that spills on the floor as soon as it happens. Remove soap buildup in the tub or shower regularly. Attach bath mats securely with double-sided non-slip rug tape. Do not have throw rugs and other things on the floor that can make you trip. What can I do in the bedroom? Use night lights. Make sure that you have a light by your bed that is easy to reach. Do not use any sheets or blankets that are too big for your bed. They should not hang down onto the floor. Have a firm chair that has side arms. You can use this for support while you get dressed. Do not have throw rugs and other things on the floor that can make you trip. What can I do in the kitchen? Clean up any spills right away. Avoid walking on wet floors. Keep items that you use a lot in easy-to-reach places. If you need to reach something above you, use a strong step stool that has a grab bar. Keep electrical cords out of the way. Do not use floor polish or wax that makes floors slippery. If you must use wax, use non-skid floor wax. Do not have throw rugs and other things on the floor that can make you trip. What can I do with my stairs? Do not leave any items on the stairs. Make sure  that there are handrails on both sides of the stairs and use them. Fix handrails that are broken or loose. Make sure that handrails are as long as the stairways. Check any carpeting to make sure that it is firmly attached to the stairs. Fix any carpet that is loose or worn. Avoid having throw rugs at the top or bottom of the stairs. If you do have throw rugs, attach them to the floor with carpet tape. Make sure that you have a light switch at the top of the stairs and the bottom of the stairs. If you do not have them, ask someone to add them for you. What else can I do to help prevent falls? Wear shoes that: Do not have high heels. Have rubber bottoms. Are comfortable  and fit you well. Are closed at the toe. Do not wear sandals. If you use a stepladder: Make sure that it is fully opened. Do not climb a closed stepladder. Make sure that both sides of the stepladder are locked into place. Ask someone to hold it for you, if possible. Clearly mark and make sure that you can see: Any grab bars or handrails. First and last steps. Where the edge of each step is. Use tools that help you move around (mobility aids) if they are needed. These include: Canes. Walkers. Scooters. Crutches. Turn on the lights when you go into a dark area. Replace any light bulbs as soon as they burn out. Set up your furniture so you have a clear path. Avoid moving your furniture around. If any of your floors are uneven, fix them. If there are any pets around you, be aware of where they are. Review your medicines with your doctor. Some medicines can make you feel dizzy. This can increase your chance of falling. Ask your doctor what other things that you can do to help prevent falls. This information is not intended to replace advice given to you by your health care provider. Make sure you discuss any questions you have with your health care provider. Document Released: 11/29/2008 Document Revised: 07/11/2015 Document Reviewed: 03/09/2014 Elsevier Interactive Patient Education  2017 [employer].

## 2020-10-28 NOTE — Progress Notes (Signed)
Subjective:   Regina Knox is a 75 y.o. female who presents for Medicare Annual (Subsequent) preventive examination.  Virtual Visit via Telephone Note  I connected with  Regina Knox on 10/28/20 at  9:45 AM EDT by telephone and verified that I am speaking with the correct person using two identifiers.  Location: Patient: Home Provider: [facility] Persons participating in the virtual visit: patient/Nurse Health Advisor   I discussed the limitations, risks, security and privacy concerns of performing an evaluation and management service by telephone and the availability of in person appointments. The patient expressed understanding and agreed to proceed.  Interactive audio and video telecommunications were attempted between this nurse and patient, however failed, due to patient having technical difficulties OR patient did not have access to video capability.  We continued and completed visit with audio only.  Some vital signs may be absent or patient reported.   Phenix Grein E Anh Bigos, LPN   Review of Systems     Cardiac Risk Factors include: advanced age (>19mn, >>17women);hypertension     Objective:    Today's Vitals   10/28/20 0951  Weight: 149 lb (67.6 kg)  Height: '5\' 4"'$  (1.626 m)   Body mass index is 25.58 kg/m.  Advanced Directives 10/28/2020 05/09/2019 01/31/2014 11/06/2013 11/06/2013 11/05/2013  Does Patient Have a Medical Advance Directive? No No No No No No  Would patient like information on creating a medical advance directive? No - Patient declined No - Patient declined Yes - Educational materials given - No - patient declined information No - patient declined information    Current Medications (verified) Outpatient Encounter Medications as of 10/28/2020  Medication Sig   fluticasone (FLONASE) 50 MCG/ACT nasal spray Place 2 sprays into both nostrils daily.   Fluticasone Furoate (ARNUITY ELLIPTA) 100 MCG/ACT AEPB Inhale 1 Dose into the lungs daily. Take one inhalation orally  daily.   olmesartan-hydrochlorothiazide (BENICAR HCT) 40-25 MG tablet TAKE ONE (1) TABLET EACH DAY   No facility-administered encounter medications on file as of 10/28/2020.    Allergies (verified) Ibuprofen   History: Past Medical History:  Diagnosis Date   Allergy    Asthma    Hypertension    Past Surgical History:  Procedure Laterality Date   BREAST SURGERY  80's   right breast   ORIF ANKLE FRACTURE Right 11/06/2013   Procedure: OPEN REDUCTION INTERNAL FIXATION (ORIF) ANKLE FRACTURE;  Surgeon: WSanjuana Kava MD;  Location: [facility];  Service: Orthopedics;  Laterality: Right;   TUBAL LIGATION     Family History  Problem Relation Age of Onset   Hypertension Mother    Diabetes Mother    Hypertension Father    Heart disease Father    Cancer Sister        brain, breast   Hypertension Sister    Heart disease Brother        congestive heart failure   Diabetes Sister    Hypertension Sister    Hypertension Son    Diabetes Son    Diabetes Maternal Grandmother    Diabetes Paternal Grandmother    Social History   Socioeconomic History   Marital status: Widowed    Spouse name: RJori Moll  Number of children: 1   Years of education: 12   Highest education level: 12th grade  Occupational History   Occupation: Retired  Tobacco Use   Smoking status: Never   Smokeless tobacco: Never  Vaping Use   Vaping Use: Never used  Substance and Sexual Activity  Alcohol use: No   Drug use: No   Sexual activity: Not Currently  Other Topics Concern   Not on file  Social History Narrative   Husband passed 2021   Son lives in [place] Determinants of Health   Financial Resource Strain: Low Risk    Difficulty of Paying Living Expenses: Not hard at all  Food Insecurity: No Food Insecurity   Worried About Charity fundraiser in the Last Year: Never true   Arboriculturist in the Last Year: Never true  Transportation Needs: No Transportation Needs   Lack of Transportation  (Medical): No   Lack of Transportation (Non-Medical): No  Physical Activity: Insufficiently Active   Days of Exercise per Week: 7 days   Minutes of Exercise per Session: 20 min  Stress: No Stress Concern Present   Feeling of Stress : Not at all  Social Connections: Moderately Integrated   Frequency of Communication with Friends and Family: More than three times a week   Frequency of Social Gatherings with Friends and Family: Twice a week   Attends Religious Services: More than 4 times per year   Active Member of [employer] or Organizations: Yes   Attends Archivist Meetings: More than 4 times per year   Marital Status: Widowed    Tobacco Counseling Counseling given: Not Answered   Clinical Intake:  Pre-visit preparation completed: Yes  Pain : No/denies pain     BMI - recorded: 25.58 Nutritional Status: BMI 25 -29 Overweight Nutritional Risks: None Diabetes: No  How often do you need to have someone help you when you read instructions, pamphlets, or other written materials from your doctor or pharmacy?: 1 - Never  Diabetic? No  Interpreter Needed?: No  Information entered by :: Kavita Bartl, LPN   Activities of Daily Living In your present state of health, do you have any difficulty performing the following activities: 10/28/2020 08/22/2020  Hearing? N N  Vision? N N  Difficulty concentrating or making decisions? N N  Walking or climbing stairs? N N  Dressing or bathing? N N  Doing errands, shopping? N N  Preparing Food and eating ? N -  Using the Toilet? N -  In the past six months, have you accidently leaked urine? N -  Do you have problems with loss of bowel control? N -  Managing your Medications? N -  Managing your Finances? N -  Housekeeping or managing your Housekeeping? N -  Some recent data might be hidden    Patient Care Team: Chevis Pretty, FNP as PCP - General (Nurse Practitioner)  Indicate any recent Medical Services you may have  received from other than [facility] providers in the past year (date may be approximate).     Assessment:   This is a routine wellness examination for [facility]  Hearing/Vision screen Hearing Screening - Comments:: Denies hearing difficulties  Vision Screening - Comments:: Wears eyeglasses - behind on annual eye exams - needs new eye doctor  Dietary issues and exercise activities discussed: Current Exercise Habits: Home exercise routine, Type of exercise: walking;Other - see comments (yard work), Time (Minutes): 30, Frequency (Times/Week): 5, Weekly Exercise (Minutes/Week): 150, Intensity: Mild, Exercise limited by: None identified   Goals Addressed             This Visit's Progress    Patient Stated   On track    05/09/2019 AWV Goal: Fall Prevention  Over the next year, patient will decrease  their risk for falls by: Using assistive devices, such as a cane or walker, as needed Identifying fall risks within their home and correcting them by: Removing throw rugs Adding handrails to stairs or ramps Removing clutter and keeping a clear pathway throughout the home Increasing light, especially at night Adding shower handles/bars Raising toilet seat Identifying potential personal risk factors for falls: Medication side effects Incontinence/urgency Vestibular dysfunction Hearing loss Musculoskeletal disorders Neurological disorders Orthostatic hypotension         Depression Screen PHQ 2/9 Scores 10/28/2020 08/22/2020 05/09/2019 04/28/2019 03/04/2018 06/24/2017 04/01/2017  PHQ - 2 Score 0 0 0 0 0 0 0  PHQ- 9 Score - 0 - - - - -    Fall Risk Fall Risk  10/28/2020 08/22/2020 05/09/2019 04/28/2019 03/04/2018  Falls in the past year? 0 0 0 0 0  Number falls in past yr: 0 - - - -  Injury with Fall? 0 - - - -  Comment - - - - -  Risk for fall due to : Impaired vision - - - -  Follow up Falls prevention discussed - - - -    FALL RISK PREVENTION PERTAINING TO THE HOME:  Any stairs in or around  the home? No  If so, are there any without handrails? No  Home free of loose throw rugs in walkways, pet beds, electrical cords, etc? Yes  Adequate lighting in your home to reduce risk of falls? Yes   ASSISTIVE DEVICES UTILIZED TO PREVENT FALLS:  Regina alert? No  Use of a cane, walker or w/c? No  Grab bars in the bathroom? Yes  Shower chair or bench in shower? Yes  Elevated toilet seat or a handicapped toilet? Yes   TIMED UP AND GO:  Was the test performed? No . Telephonic visit  Cognitive Function: Normal cognitive status assessed by direct observation by this Nurse Health Advisor. No abnormalities found.       6CIT Screen 05/09/2019  What Year? 0 points  What month? 0 points  What time? 0 points  Count back from 20 0 points  Months in reverse 0 points  Repeat phrase 0 points  Total Score 0    Immunizations Immunization History  Administered Date(s) Administered   Fluad Quad(high Dose 65+) 12/19/2018   Influenza, High Dose Seasonal PF 12/02/2012, 12/11/2016, 11/24/2017   Influenza,inj,Quad PF,6+ Mos 11/29/2014, 02/14/2016   Moderna SARS-COV2 Booster Vaccination 11/04/2019, 05/27/2020   Moderna Sars-Covid-2 Vaccination 10/07/2019   Pneumococcal Conjugate-13 11/30/2014   Pneumococcal Polysaccharide-23 11/17/2010    TDAP status: Due, Education has been provided regarding the importance of this vaccine. Advised may receive this vaccine at local pharmacy or Health Dept. Aware to provide a copy of the vaccination record if obtained from local pharmacy or Health Dept. Verbalized acceptance and understanding.  Flu Vaccine status: Due, Education has been provided regarding the importance of this vaccine. Advised may receive this vaccine at local pharmacy or Health Dept. Aware to provide a copy of the vaccination record if obtained from local pharmacy or Health Dept. Verbalized acceptance and understanding.  Pneumococcal vaccine status: Up to date  Covid-19 vaccine status:  Completed vaccines  Qualifies for Shingles Vaccine? Yes   Zostavax completed No   Shingrix Completed?: No.    Education has been provided regarding the importance of this vaccine. Patient has been advised to call insurance company to determine out of pocket expense if they have not yet received this vaccine. Advised may also receive vaccine at local pharmacy  or Health Dept. Verbalized acceptance and understanding.  Screening Tests Health Maintenance  Topic Date Due   INFLUENZA VACCINE  09/16/2020   COVID-19 Vaccine (4 - Booster for Moderna series) 09/26/2020   Zoster Vaccines- Shingrix (1 of 2) 11/22/2020 (Originally 08/05/1993)   MAMMOGRAM  08/22/2021 (Originally [birth year]/1961)   DEXA SCAN  08/22/2021 (Originally 08/05/2008)   TETANUS/TDAP  08/22/2021 (Originally 08/06/1962)   Hepatitis C Screening  08/22/2021 (Originally 08/05/1963)   PNA vac Low Risk Adult  Completed   HPV VACCINES  Aged Out    Health Maintenance  Health Maintenance Due  Topic Date Due   INFLUENZA VACCINE  09/16/2020   COVID-19 Vaccine (4 - Booster for Moderna series) 09/26/2020    Colorectal cancer screening: No longer required.   Mammogram status: No longer required due to age.  Bone Density Status: patient declined  Lung Cancer Screening: (Low Dose CT Chest recommended if Age 9-80 years, 30 pack-year currently smoking OR have quit w/in 15years.) does not qualify.   Additional Screening:  Hepatitis C Screening: does qualify; declines  Vision Screening: Recommended annual ophthalmology exams for early detection of glaucoma and other disorders of the eye. Is the patient up to date with their annual eye exam?  No  Who is the provider or what is the name of the office in which the patient attends annual eye exams? None If pt is not established with a provider, would they like to be referred to a provider to establish care? No .   Dental Screening: Recommended annual dental exams for proper oral  hygiene  Community Resource Referral / Chronic Care Management: CRR required this visit?  No   CCM required this visit?  No      Plan:     I have personally reviewed and noted the following in the patient's chart:   Medical and social history Use of alcohol, tobacco or illicit drugs  Current medications and supplements including opioid prescriptions.  Functional ability and status Nutritional status Physical activity Advanced directives List of other physicians Hospitalizations, surgeries, and ER visits in previous 12 months Vitals Screenings to include cognitive, depression, and falls Referrals and appointments  In addition, I have reviewed and discussed with patient certain preventive protocols, quality metrics, and best practice recommendations. A written personalized care plan for preventive services as well as general preventive health recommendations were provided to patient.     Sandrea Hammond, LPN   [id number]   Nurse Notes: Declines screenings. Hesitant to get Tdap and Shingrix, but will consider.

## 2020-12-26 ENCOUNTER — Other Ambulatory Visit: Payer: Self-pay

## 2020-12-26 ENCOUNTER — Ambulatory Visit (INDEPENDENT_AMBULATORY_CARE_PROVIDER_SITE_OTHER): Payer: Medicare HMO

## 2020-12-26 DIAGNOSIS — Z23 Encounter for immunization: Secondary | ICD-10-CM

## 2021-02-25 ENCOUNTER — Ambulatory Visit (INDEPENDENT_AMBULATORY_CARE_PROVIDER_SITE_OTHER): Payer: Medicare HMO | Admitting: Nurse Practitioner

## 2021-02-25 ENCOUNTER — Encounter: Payer: Self-pay | Admitting: Nurse Practitioner

## 2021-02-25 VITALS — BP 117/67 | HR 88 | Temp 97.3°F | Resp 20 | Ht 64.0 in | Wt 144.0 lb

## 2021-02-25 DIAGNOSIS — J4530 Mild persistent asthma, uncomplicated: Secondary | ICD-10-CM | POA: Diagnosis not present

## 2021-02-25 DIAGNOSIS — R195 Other fecal abnormalities: Secondary | ICD-10-CM

## 2021-02-25 DIAGNOSIS — I10 Essential (primary) hypertension: Secondary | ICD-10-CM | POA: Diagnosis not present

## 2021-02-25 DIAGNOSIS — Z6827 Body mass index (BMI) 27.0-27.9, adult: Secondary | ICD-10-CM | POA: Diagnosis not present

## 2021-02-25 DIAGNOSIS — A0472 Enterocolitis due to Clostridium difficile, not specified as recurrent: Secondary | ICD-10-CM

## 2021-02-25 LAB — CMP14+EGFR
ALT: 9 IU/L (ref 0–32)
AST: 17 IU/L (ref 0–40)
Albumin/Globulin Ratio: 1.6 (ref 1.2–2.2)
Albumin: 4.2 g/dL (ref 3.7–4.7)
Alkaline Phosphatase: 92 IU/L (ref 44–121)
BUN/Creatinine Ratio: 20 (ref 12–28)
BUN: 18 mg/dL (ref 8–27)
Bilirubin Total: 0.3 mg/dL (ref 0.0–1.2)
CO2: 26 mmol/L (ref 20–29)
Calcium: 9.6 mg/dL (ref 8.7–10.3)
Chloride: 101 mmol/L (ref 96–106)
Creatinine, Ser: 0.88 mg/dL (ref 0.57–1.00)
Globulin, Total: 2.6 g/dL (ref 1.5–4.5)
Glucose: 108 mg/dL — ABNORMAL HIGH (ref 70–99)
Potassium: 4.4 mmol/L (ref 3.5–5.2)
Sodium: 141 mmol/L (ref 134–144)
Total Protein: 6.8 g/dL (ref 6.0–8.5)
eGFR: 68 mL/min/{1.73_m2} (ref >59)

## 2021-02-25 LAB — CBC WITH DIFFERENTIAL/PLATELET
Basophils Absolute: 0.1 10*3/uL (ref 0.0–0.2)
Basos: 1 %
EOS (ABSOLUTE): 0.4 10*3/uL (ref 0.0–0.4)
Eos: 5 %
Hematocrit: 35 % (ref 34.0–46.6)
Hemoglobin: 10.9 g/dL — ABNORMAL LOW (ref 11.1–15.9)
Immature Grans (Abs): 0 10*3/uL (ref 0.0–0.1)
Immature Granulocytes: 0 %
Lymphocytes Absolute: 1.2 10*3/uL (ref 0.7–3.1)
Lymphs: 17 %
MCH: 24.2 pg — ABNORMAL LOW (ref 26.6–33.0)
MCHC: 31.1 g/dL — ABNORMAL LOW (ref 31.5–35.7)
MCV: 78 fL — ABNORMAL LOW (ref 79–97)
Monocytes Absolute: 0.6 10*3/uL (ref 0.1–0.9)
Monocytes: 9 %
Neutrophils Absolute: 4.6 10*3/uL (ref 1.4–7.0)
Neutrophils: 68 %
Platelets: 399 10*3/uL (ref 150–450)
RBC: 4.5 x10E6/uL (ref 3.77–5.28)
RDW: 16.1 % — ABNORMAL HIGH (ref 11.7–15.4)
WBC: 6.8 10*3/uL (ref 3.4–10.8)

## 2021-02-25 LAB — LIPID PANEL
Chol/HDL Ratio: 3.6 ratio (ref 0.0–4.4)
Cholesterol, Total: 208 mg/dL — ABNORMAL HIGH (ref 100–199)
HDL: 58 mg/dL (ref >39)
LDL Chol Calc (NIH): 134 mg/dL — ABNORMAL HIGH (ref 0–99)
Triglycerides: 91 mg/dL (ref 0–149)
VLDL Cholesterol Cal: 16 mg/dL (ref 5–40)

## 2021-02-25 MED ORDER — ARNUITY ELLIPTA 100 MCG/ACT IN AEPB: 1.0000 | INHALATION_SPRAY | Freq: Every day | RESPIRATORY_TRACT | 6 refills | Status: DC

## 2021-02-25 MED ORDER — FLUTICASONE PROPIONATE 50 MCG/ACT NA SUSP: 2.0000 | Freq: Every day | NASAL | 5 refills | Status: DC

## 2021-02-25 MED ORDER — OLMESARTAN MEDOXOMIL-HCTZ 40-25 MG PO TABS: ORAL_TABLET | ORAL | 1 refills | Status: DC

## 2021-02-25 NOTE — Progress Notes (Signed)
Subjective:    Patient ID: Regina Knox, female    DOB: 1946-02-04, 76 y.o.   MRN: [id number]   Chief Complaint: medical management of chronic issues     HPI:  Regina Knox is a 76 y.o. who identifies as a female who was assigned female at birth.   Social history: Lives with: by herself- her husband died last year Work history: retired   Scientist, forensic in today for follow up of the following chronic medical issues:  1. Essential hypertension, benign No c/o chest pain, sob or headache. Does not check blood pressure at home BP Readings from Last 3 Encounters:  08/22/20 120/77  04/28/19 131/80  03/04/18 135/75     2. Mild persistent chronic asthma without complication Uses arnunity and is doing well.   3. BMI 27.0-27.9,adult Weight is down 4lbs Wt Readings from Last 3 Encounters:  10/28/20 149 lb (67.6 kg)  08/22/20 153 lb (69.4 kg)  04/28/19 156 lb 9.6 oz (71 kg)   BMI Readings from Last 3 Encounters:  10/28/20 25.58 kg/m  08/22/20 26.26 kg/m  04/28/19 26.88 kg/m      New complaints: Frequent loose stools  Allergies  Allergen Reactions   Ibuprofen Rash   Outpatient Encounter Medications as of 02/25/2021  Medication Sig   fluticasone (FLONASE) 50 MCG/ACT nasal spray Place 2 sprays into both nostrils daily.   Fluticasone Furoate (ARNUITY ELLIPTA) 100 MCG/ACT AEPB Inhale 1 Dose into the lungs daily. Take one inhalation orally daily.   olmesartan-hydrochlorothiazide (BENICAR HCT) 40-25 MG tablet TAKE ONE (1) TABLET EACH DAY   No facility-administered encounter medications on file as of 02/25/2021.    Past Surgical History:  Procedure Laterality Date   BREAST SURGERY  80's   right breast   ORIF ANKLE FRACTURE Right 11/06/2013   Procedure: OPEN REDUCTION INTERNAL FIXATION (ORIF) ANKLE FRACTURE;  Surgeon: Sanjuana Kava, MD;  Location: [facility];  Service: Orthopedics;  Laterality: Right;   TUBAL LIGATION      Family History  Problem Relation Age of Onset    Hypertension Mother    Diabetes Mother    Hypertension Father    Heart disease Father    Cancer Sister        brain, breast   Hypertension Sister    Heart disease Brother        congestive heart failure   Diabetes Sister    Hypertension Sister    Hypertension Son    Diabetes Son    Diabetes Maternal Grandmother    Diabetes Paternal Grandmother       Controlled substance contract: n/a     Review of Systems  Constitutional:  Negative for diaphoresis.  Eyes:  Negative for pain.  Respiratory:  Negative for shortness of breath.   Cardiovascular:  Negative for chest pain, palpitations and leg swelling.  Gastrointestinal:  Negative for abdominal pain.  Endocrine: Negative for polydipsia.  Skin:  Negative for rash.  Neurological:  Negative for dizziness, weakness and headaches.  Hematological:  Does not bruise/bleed easily.  All other systems reviewed and are negative.     Objective:   Physical Exam Vitals and nursing note reviewed.  Constitutional:      General: She is not in acute distress.    Appearance: Normal appearance. She is well-developed.  HENT:     Head: Normocephalic.     Right Ear: Tympanic membrane normal.     Left Ear: Tympanic membrane normal.     Nose: Nose normal.  Mouth/Throat:     Mouth: Mucous membranes are moist.  Eyes:     Pupils: Pupils are equal, round, and reactive to light.  Neck:     Vascular: No carotid bruit or JVD.  Cardiovascular:     Rate and Rhythm: Normal rate and regular rhythm.     Heart sounds: Normal heart sounds.  Pulmonary:     Effort: Pulmonary effort is normal. No respiratory distress.     Breath sounds: Normal breath sounds. No wheezing or rales.  Chest:     Chest wall: No tenderness.  Abdominal:     General: Bowel sounds are normal. There is no distension or abdominal bruit.     Palpations: Abdomen is soft. There is no hepatomegaly, splenomegaly, mass or pulsatile mass.     Tenderness: There is no abdominal  tenderness.  Musculoskeletal:        General: Normal range of motion.     Cervical back: Normal range of motion and neck supple.  Lymphadenopathy:     Cervical: No cervical adenopathy.  Skin:    General: Skin is warm and dry.  Neurological:     Mental Status: She is alert and oriented to person, place, and time.     Deep Tendon Reflexes: Reflexes are normal and symmetric.  Psychiatric:        Behavior: Behavior normal.        Thought Content: Thought content normal.        Judgment: Judgment normal.    BP 117/67    Pulse 88    Temp (!) 97.3 F (36.3 C) (Temporal)    Resp 20    Ht '5\' 4"'  (1.626 m)    Wt 144 lb (65.3 kg)    LMP 05/25/1996    SpO2 99%    BMI 24.72 kg/m        Assessment & Plan:  Regina Knox comes in today with chief complaint of Medical Management of Chronic Issues   Diagnosis and orders addressed:  1. Essential hypertension, benign Ow sodium diet - olmesartan-hydrochlorothiazide (BENICAR HCT) 40-25 MG tablet; TAKE ONE (1) TABLET EACH DAY  Dispense: 90 tablet; Refill: 1 - CBC with Differential/Platelet - CMP14+EGFR - Lipid panel  2. Mild persistent chronic asthma without complication - Fluticasone Furoate (ARNUITY ELLIPTA) 100 MCG/ACT AEPB; Inhale 1 Dose into the lungs daily. Take one inhalation orally daily.  Dispense: 30 each; Refill: 6 - fluticasone (FLONASE) 50 MCG/ACT nasal spray; Place 2 sprays into both nostrils daily.  Dispense: 16 g; Refill: 5  3. BMI 27.0-27.9,adult Discussed diet and exercise for person with BMI >25 Will recheck weight in 3-6 months  4. Loose stools   Labs pending Health Maintenance reviewed Diet and exercise encouraged  Follow up plan: 6 months   [place], FNP

## 2021-02-25 NOTE — Patient Instructions (Signed)
Food Choices to Help Relieve Diarrhea, Adult Diarrhea can make you feel weak and cause you to become dehydrated. It is important to choose the right foods and drinks to: Relieve diarrhea. Replace lost fluids and nutrients. Prevent dehydration. What are tips for following this plan? Relieving diarrhea Avoid foods that make your diarrhea worse. These may include: Foods and beverages sweetened with high-fructose corn syrup, honey, or sweeteners such as xylitol, sorbitol, and mannitol. Fried, greasy, or spicy foods. Raw fruits and vegetables. Eat foods that are rich in probiotics. These include foods such as yogurt and fermented milk products. Probiotics can help increase healthy bacteria in your stomach and intestines (gastrointestinal tract or GI tract). This may help digestion and stop diarrhea. If you have lactose intolerance, avoid dairy products. These may make your diarrhea worse. Take medicine to help stop diarrhea only as told by your health care provider. Replacing nutrients  Eat bland, easy-to-digest foods in small amounts as you are able, until your diarrhea starts to get better. These foods include bananas, applesauce, rice, toast, and crackers. Gradually reintroduce nutrient-rich foods as tolerated or as told by your health care provider. This includes: Well-cooked protein foods, such as eggs, lean meats like fish or chicken without skin, and tofu. Peeled, seeded, and soft-cooked fruits and vegetables. Low-fat dairy products. Whole grains. Take vitamin and mineral supplements as told by your health care provider. Preventing dehydration  Start by sipping water or a solution to prevent dehydration (oral rehydration solution, ORS). This is a drink that helps replace fluids and minerals your body has lost. You can buy an ORS at pharmacies and retail stores. Try to drink at least 8-10 cups (2,000-2,500 mL) of fluid each day to help replace lost fluids. If you have urine that is pale  yellow, you are getting enough fluids. You may drink other liquids in addition to water, such as fruit juice that you have added water to (diluted fruit juice) or low-calorie sports drinks, as tolerated or as told by your health care provider. Avoid drinks with caffeine, such as coffee, tea, or soft drinks. Avoid alcohol. Summary When you have diarrhea, it is important to choose the right foods and drinks to relieve diarrhea, to replace lost fluids and nutrients, and to prevent dehydration. Make sure you drink enough fluid to keep your urine pale yellow. You may benefit from eating bland foods at first. Gradually reintroduce healthy, nutrient-rich foods as tolerated or as told by your health care provider. Avoid foods that make your diarrhea worse, such as fried, greasy, or spicy foods. This information is not intended to replace advice given to you by your health care provider. Make sure you discuss any questions you have with your health care provider. Document Revised: 03/21/2019 Document Reviewed: 03/21/2019 Elsevier Patient Education  2022 [employer].

## 2021-02-26 ENCOUNTER — Other Ambulatory Visit: Payer: Medicare HMO

## 2021-02-26 DIAGNOSIS — R195 Other fecal abnormalities: Secondary | ICD-10-CM | POA: Diagnosis not present

## 2021-02-28 MED ORDER — VANCOMYCIN HCL 250 MG PO CAPS: 250.0000 mg | ORAL_CAPSULE | Freq: Four times a day (QID) | ORAL | 0 refills | Status: DC

## 2021-02-28 NOTE — Addendum Note (Signed)
Addended by: Chevis Pretty on: 02/28/2021 11:23 AM   Modules accepted: Orders

## 2021-03-02 LAB — CDIFF NAA+O+P+STOOL CULTURE
E coli, Shiga toxin Assay: NEGATIVE
Toxigenic C. Difficile by PCR: POSITIVE — AB

## 2021-03-18 ENCOUNTER — Telehealth: Payer: Self-pay | Admitting: Nurse Practitioner

## 2021-03-18 DIAGNOSIS — A0472 Enterocolitis due to Clostridium difficile, not specified as recurrent: Secondary | ICD-10-CM

## 2021-03-18 MED ORDER — VANCOMYCIN HCL 250 MG PO CAPS: 250.0000 mg | ORAL_CAPSULE | Freq: Four times a day (QID) | ORAL | 0 refills | Status: DC

## 2021-03-18 NOTE — Telephone Encounter (Signed)
Pt is still having digestive issues and she would like to know if there is anything else that she can try. She said that the medicine she was given helped but that it did not ever get completely better and now her cramps are getting bad again. Please call back and advise.

## 2021-03-18 NOTE — Telephone Encounter (Signed)
Probably needs  to see GI- has she seen one in the past?

## 2021-03-18 NOTE — Telephone Encounter (Signed)
Patient has not seen a GI doctor.  She said she would like to wait and if you agree would like to take another round of Vancomycin and possibly a longer dosage.  Pharmacy is [facility], please advise.

## 2021-06-24 ENCOUNTER — Ambulatory Visit: Payer: Medicare HMO | Admitting: Nurse Practitioner

## 2021-06-24 ENCOUNTER — Ambulatory Visit (INDEPENDENT_AMBULATORY_CARE_PROVIDER_SITE_OTHER): Payer: Medicare HMO | Admitting: Family Medicine

## 2021-06-24 ENCOUNTER — Encounter: Payer: Self-pay | Admitting: Family Medicine

## 2021-06-24 VITALS — BP 116/74 | HR 101 | Temp 98.7°F | Ht 64.0 in | Wt 138.0 lb

## 2021-06-24 DIAGNOSIS — R5383 Other fatigue: Secondary | ICD-10-CM | POA: Diagnosis not present

## 2021-06-24 DIAGNOSIS — R6889 Other general symptoms and signs: Secondary | ICD-10-CM | POA: Diagnosis not present

## 2021-06-24 DIAGNOSIS — F5101 Primary insomnia: Secondary | ICD-10-CM

## 2021-06-24 DIAGNOSIS — D509 Iron deficiency anemia, unspecified: Secondary | ICD-10-CM

## 2021-06-24 DIAGNOSIS — R5381 Other malaise: Secondary | ICD-10-CM | POA: Diagnosis not present

## 2021-06-24 DIAGNOSIS — R69 Illness, unspecified: Secondary | ICD-10-CM | POA: Diagnosis not present

## 2021-06-24 DIAGNOSIS — E559 Vitamin D deficiency, unspecified: Secondary | ICD-10-CM

## 2021-06-24 NOTE — Progress Notes (Signed)
?  ? ?Subjective:  ?Patient ID: Regina Knox, female    DOB: 01-17-1946, 76 y.o.   MRN: [id number] ? ?Patient Care Team: ?Chevis Pretty, FNP as PCP - General (Nurse Practitioner)  ? ?Chief Complaint:  Insomnia ? ? ?HPI: ?Regina Knox is a 76 y.o. female presenting on 06/24/2021 for Insomnia ? ? ?Pt presents today for evaluation of insomnia with malaise and fatigue over the last few weeks. States she does not like to take medications, so she has not tried anything for it. She states he had C-Diff in January and would like to be placed on prednisone for colon inflammation. She denies any symptoms such as abdominal pain, rectal bleeding, or diarrhea. Educated on proper use of prednisone ? ?Insomnia ?Primary symptoms: fragmented sleep, no sleep disturbance, difficulty falling asleep, no somnolence, frequent awakening, no premature morning awakening, malaise/fatigue, no napping.   ?The current episode started one month. The problem is unchanged. Nothing relieves the symptoms. Past treatments include nothing.  ? ? ?Relevant past medical, surgical, family, and social history reviewed and updated as indicated.  ?Allergies and medications reviewed and updated. Data reviewed: Chart in Epic. ? ? ?Past Medical History:  ?Diagnosis Date  ? Allergy   ? Asthma   ? Hypertension   ? ? ?Past Surgical History:  ?Procedure Laterality Date  ? BREAST SURGERY  80's  ? right breast  ? ORIF ANKLE FRACTURE Right 11/06/2013  ? Procedure: OPEN REDUCTION INTERNAL FIXATION (ORIF) ANKLE FRACTURE;  Surgeon: Sanjuana Kava, MD;  Location: [facility];  Service: Orthopedics;  Laterality: Right;  ? TUBAL LIGATION    ? ? ?Social History  ? ?Socioeconomic History  ? Marital status: Widowed  ?  Spouse name: Jori Moll  ? Number of children: 1  ? Years of education: 37  ? Highest education level: 12th grade  ?Occupational History  ? Occupation: Retired  ?Tobacco Use  ? Smoking status: Never  ? Smokeless tobacco: Never  ?Vaping Use  ? Vaping Use: Never used   ?Substance and Sexual Activity  ? Alcohol use: No  ? Drug use: No  ? Sexual activity: Not Currently  ?Other Topics Concern  ? Not on file  ?Social History Narrative  ? Husband passed 2021  ? Son lives in [place]  ? ?Social Determinants of Health  ? ?Financial Resource Strain: Low Risk   ? Difficulty of Paying Living Expenses: Not hard at all  ?Food Insecurity: No Food Insecurity  ? Worried About Charity fundraiser in the Last Year: Never true  ? Ran Out of Food in the Last Year: Never true  ?Transportation Needs: No Transportation Needs  ? Lack of Transportation (Medical): No  ? Lack of Transportation (Non-Medical): No  ?Physical Activity: Insufficiently Active  ? Days of Exercise per Week: 7 days  ? Minutes of Exercise per Session: 20 min  ?Stress: No Stress Concern Present  ? Feeling of Stress : Not at all  ?Social Connections: Moderately Integrated  ? Frequency of Communication with Friends and Family: More than three times a week  ? Frequency of Social Gatherings with Friends and Family: Twice a week  ? Attends Religious Services: More than 4 times per year  ? Active Member of Clubs or Organizations: Yes  ? Attends Archivist Meetings: More than 4 times per year  ? Marital Status: Widowed  ?Intimate Partner Violence: Not At Risk  ? Fear of Current or Ex-Partner: No  ? Emotionally Abused: No  ? Physically  Abused: No  ? Sexually Abused: No  ? ? ?Outpatient Encounter Medications as of 06/24/2021  ?Medication Sig  ? fluticasone (FLONASE) 50 MCG/ACT nasal spray Place 2 sprays into both nostrils daily.  ? Fluticasone Furoate (ARNUITY ELLIPTA) 100 MCG/ACT AEPB Inhale 1 Dose into the lungs daily. Take one inhalation orally daily.  ? olmesartan-hydrochlorothiazide (BENICAR HCT) 40-25 MG tablet TAKE ONE (1) TABLET EACH DAY  ? vancomycin (VANCOCIN) 250 MG capsule Take 1 capsule (250 mg total) by mouth 4 (four) times daily.  ? ?No facility-administered encounter medications on file as of 06/24/2021.   ? ? ?Allergies  ?Allergen Reactions  ? Ibuprofen Rash  ? ? ?Review of Systems  ?Constitutional:  Positive for activity change, fatigue and malaise/fatigue. Negative for appetite change, chills, diaphoresis, fever and unexpected weight change.  ?HENT: Negative.    ?Eyes: Negative.   ?Respiratory:  Negative for cough, chest tightness and shortness of breath.   ?Cardiovascular:  Negative for chest pain, palpitations and leg swelling.  ?Gastrointestinal:  Negative for abdominal distention, abdominal pain, anal bleeding, blood in stool, constipation, diarrhea, nausea, rectal pain and vomiting.  ?Endocrine: Negative.   ?Genitourinary:  Negative for decreased urine volume, difficulty urinating, dysuria, frequency and urgency.  ?Musculoskeletal:  Negative for arthralgias and myalgias.  ?Skin: Negative.   ?Allergic/Immunologic: Negative.   ?Neurological:  Negative for dizziness, tremors, seizures, syncope, facial asymmetry, speech difficulty, weakness, light-headedness, numbness and headaches.  ?Hematological: Negative.   ?Psychiatric/Behavioral:  Negative for confusion, hallucinations, sleep disturbance and suicidal ideas. The patient has insomnia.   ?All other systems reviewed and are negative. ? ?   ? ?Objective:  ?BP 116/74   Pulse (!) 101   Temp 98.7 ?F (37.1 ?C)   Ht '5\' 4"'$  (1.626 m)   Wt 138 lb (62.6 kg)   LMP 05/25/1996   SpO2 96%   BMI 23.69 kg/m?   ? ?Wt Readings from Last 3 Encounters:  ?06/24/21 138 lb (62.6 kg)  ?02/25/21 144 lb (65.3 kg)  ?10/28/20 149 lb (67.6 kg)  ? ? ?Physical Exam ?Vitals and nursing note reviewed.  ?Constitutional:   ?   General: She is not in acute distress. ?   Appearance: Normal appearance. She is well-developed, well-groomed and normal weight. She is not ill-appearing, toxic-appearing or diaphoretic.  ?HENT:  ?   Head: Normocephalic and atraumatic.  ?   Jaw: There is normal jaw occlusion.  ?   Right Ear: Hearing normal.  ?   Left Ear: Hearing normal.  ?   Nose: Nose normal.  ?    Mouth/Throat:  ?   Lips: Pink.  ?   Mouth: Mucous membranes are moist.  ?   Pharynx: Oropharynx is clear. Uvula midline.  ?Eyes:  ?   General: Lids are normal.  ?   Pupils: Pupils are equal, round, and reactive to light.  ?Neck:  ?   Thyroid: No thyroid mass, thyromegaly or thyroid tenderness.  ?   Vascular: No carotid bruit or JVD.  ?   Trachea: Trachea and phonation normal.  ?Cardiovascular:  ?   Rate and Rhythm: Normal rate and regular rhythm.  ?   Chest Wall: PMI is not displaced.  ?   Pulses: Normal pulses.  ?   Heart sounds: Normal heart sounds. No murmur heard. ?  No friction rub. No gallop.  ?Pulmonary:  ?   Effort: Pulmonary effort is normal. No respiratory distress.  ?   Breath sounds: Normal breath sounds. No wheezing.  ?Abdominal:  ?  General: Bowel sounds are normal. There is no abdominal bruit.  ?   Palpations: Abdomen is soft. There is no hepatomegaly or splenomegaly.  ?Musculoskeletal:     ?   General: Normal range of motion.  ?   Cervical back: Normal range of motion and neck supple.  ?   Right lower leg: No edema.  ?   Left lower leg: No edema.  ?Lymphadenopathy:  ?   Cervical: No cervical adenopathy.  ?Skin: ?   General: Skin is warm and dry.  ?   Capillary Refill: Capillary refill takes less than 2 seconds.  ?   Coloration: Skin is not cyanotic, jaundiced or pale.  ?   Findings: No rash.  ?Neurological:  ?   General: No focal deficit present.  ?   Mental Status: She is alert and oriented to person, place, and time.  ?   Sensory: Sensation is intact.  ?   Motor: Motor function is intact.  ?   Coordination: Coordination is intact.  ?   Gait: Gait is intact.  ?   Deep Tendon Reflexes: Reflexes are normal and symmetric.  ?Psychiatric:     ?   Attention and Perception: Attention and perception normal.     ?   Mood and Affect: Mood and affect normal.     ?   Speech: Speech normal.     ?   Behavior: Behavior normal. Behavior is cooperative.     ?   Thought Content: Thought content normal.     ?    Cognition and Memory: Cognition and memory normal.     ?   Judgment: Judgment normal.  ? ? ?Results for orders placed or performed in visit on 02/25/21  ?Cdiff NAA+O+P+Stool Culture  ? Specimen: Stool  ? ST  ?Result Value R

## 2021-06-25 LAB — CBC WITH DIFFERENTIAL/PLATELET
Basophils Absolute: 0 10*3/uL (ref 0.0–0.2)
Basos: 1 %
EOS (ABSOLUTE): 0.2 10*3/uL (ref 0.0–0.4)
Eos: 3 %
Hematocrit: 29.7 % — ABNORMAL LOW (ref 34.0–46.6)
Hemoglobin: 9.4 g/dL — ABNORMAL LOW (ref 11.1–15.9)
Immature Grans (Abs): 0 10*3/uL (ref 0.0–0.1)
Immature Granulocytes: 1 %
Lymphocytes Absolute: 1.5 10*3/uL (ref 0.7–3.1)
Lymphs: 17 %
MCH: 24.4 pg — ABNORMAL LOW (ref 26.6–33.0)
MCHC: 31.6 g/dL (ref 31.5–35.7)
MCV: 77 fL — ABNORMAL LOW (ref 79–97)
Monocytes Absolute: 0.7 10*3/uL (ref 0.1–0.9)
Monocytes: 8 %
Neutrophils Absolute: 6.3 10*3/uL (ref 1.4–7.0)
Neutrophils: 70 %
Platelets: 475 10*3/uL — ABNORMAL HIGH (ref 150–450)
RBC: 3.85 x10E6/uL (ref 3.77–5.28)
RDW: 15.2 % (ref 11.7–15.4)
WBC: 8.9 10*3/uL (ref 3.4–10.8)

## 2021-06-25 LAB — BMP8+EGFR
BUN/Creatinine Ratio: 19 (ref 12–28)
BUN: 15 mg/dL (ref 8–27)
CO2: 25 mmol/L (ref 20–29)
Calcium: 9.3 mg/dL (ref 8.7–10.3)
Chloride: 101 mmol/L (ref 96–106)
Creatinine, Ser: 0.77 mg/dL (ref 0.57–1.00)
Glucose: 129 mg/dL — ABNORMAL HIGH (ref 70–99)
Potassium: 4.2 mmol/L (ref 3.5–5.2)
Sodium: 140 mmol/L (ref 134–144)
eGFR: 79 mL/min/{1.73_m2} (ref >59)

## 2021-06-25 LAB — VITAMIN D 25 HYDROXY (VIT D DEFICIENCY, FRACTURES): Vit D, 25-Hydroxy: 15.2 ng/mL — ABNORMAL LOW (ref 30.0–100.0)

## 2021-06-25 LAB — VITAMIN B12: Vitamin B-12: 331 pg/mL (ref 232–1245)

## 2021-06-25 MED ORDER — IRON (FERROUS SULFATE) 325 (65 FE) MG PO TABS: 325.0000 mg | ORAL_TABLET | Freq: Every day | ORAL | 3 refills | Status: DC

## 2021-06-25 MED ORDER — VITAMIN D (ERGOCALCIFEROL) 1.25 MG (50000 UNIT) PO CAPS
50000.0000 [IU] | ORAL_CAPSULE | ORAL | 3 refills | Status: DC
Start: 2021-06-25 — End: 2021-08-25

## 2021-06-25 NOTE — Addendum Note (Signed)
Addended by: Geryl Rankins D on: 06/25/2021 05:17 PM ? ? Modules accepted: Orders ? ?

## 2021-06-25 NOTE — Addendum Note (Signed)
Addended by: Baruch Gouty on: 06/25/2021 08:56 AM ? ? Modules accepted: Orders ? ?

## 2021-07-03 ENCOUNTER — Encounter: Payer: Self-pay | Admitting: Nurse Practitioner

## 2021-07-03 ENCOUNTER — Ambulatory Visit (INDEPENDENT_AMBULATORY_CARE_PROVIDER_SITE_OTHER): Payer: Medicare HMO | Admitting: Nurse Practitioner

## 2021-07-03 VITALS — BP 112/72 | HR 89 | Temp 98.2°F | Resp 20 | Ht 64.0 in | Wt 136.0 lb

## 2021-07-03 DIAGNOSIS — Z6827 Body mass index (BMI) 27.0-27.9, adult: Secondary | ICD-10-CM | POA: Diagnosis not present

## 2021-07-03 DIAGNOSIS — A0472 Enterocolitis due to Clostridium difficile, not specified as recurrent: Secondary | ICD-10-CM

## 2021-07-03 DIAGNOSIS — I10 Essential (primary) hypertension: Secondary | ICD-10-CM

## 2021-07-03 DIAGNOSIS — I499 Cardiac arrhythmia, unspecified: Secondary | ICD-10-CM | POA: Diagnosis not present

## 2021-07-03 DIAGNOSIS — J4530 Mild persistent asthma, uncomplicated: Secondary | ICD-10-CM

## 2021-07-03 MED ORDER — PREDNISONE 20 MG PO TABS
40.0000 mg | ORAL_TABLET | Freq: Every day | ORAL | 0 refills | Status: AC
Start: 2021-07-03 — End: 2021-07-08

## 2021-07-03 MED ORDER — OLMESARTAN MEDOXOMIL-HCTZ 40-25 MG PO TABS: ORAL_TABLET | ORAL | 1 refills | Status: DC

## 2021-07-03 NOTE — Progress Notes (Signed)
Subjective:    Patient ID: Regina Knox, female    DOB: 1945/07/14, 76 y.o.   MRN: [id number]  Chief Complaint: Feels like she has inflammation in colon (Discuss labs done last week/)    HPI:  Regina Knox is a 76 y.o. who identifies as a female who was assigned female at birth.   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 Essential hypertension, benign No c/o chest pain, sob or headache. Does not check blood pressure at home. BP Readings from Last 3 Encounters:  07/03/21 112/72  06/24/21 116/74  02/25/21 117/67     2. Irregular heart beat No palpitations or heart racing  3. Mild persistent chronic asthma without complication Is on arnuity and is doing well.  4. BMI 27.0-27.9,adult No recent weight changes Wt Readings from Last 3 Encounters:  07/03/21 136 lb (61.7 kg)  06/24/21 138 lb (62.6 kg)  02/25/21 144 lb (65.3 kg)   BMI Readings from Last 3 Encounters:  07/03/21 23.34 kg/m  06/24/21 23.69 kg/m  02/25/21 24.72 kg/m      New complaints: -She had c diff several months ago and she still does not feel that her colon is right. Her husband had c diff for over a year and they had to give him steroids at one point in time. - was recently anemic and was started on iron supplements - vitamin d low and recently stated on vitamin d supplement  Allergies  Allergen Reactions   Ibuprofen Rash   Outpatient Encounter Medications as of 07/03/2021  Medication Sig   fluticasone (FLONASE) 50 MCG/ACT nasal spray Place 2 sprays into both nostrils daily.   Fluticasone Furoate (ARNUITY ELLIPTA) 100 MCG/ACT AEPB Inhale 1 Dose into the lungs daily. Take one inhalation orally daily.   Iron, Ferrous Sulfate, 325 (65 Fe) MG TABS Take 325 mg by mouth daily.   olmesartan-hydrochlorothiazide (BENICAR HCT) 40-25 MG tablet TAKE ONE (1) TABLET EACH DAY   Vitamin D, Ergocalciferol,  (DRISDOL) 1.25 MG (50000 UNIT) CAPS capsule Take 1 capsule (50,000 Units total) by mouth every 7 (seven) days.   [DISCONTINUED] vancomycin (VANCOCIN) 250 MG capsule Take 1 capsule (250 mg total) by mouth 4 (four) times daily.   No facility-administered encounter medications on file as of 07/03/2021.    Past Surgical History:  Procedure Laterality Date   BREAST SURGERY  80's   right breast   ORIF ANKLE FRACTURE Right 11/06/2013   Procedure: OPEN REDUCTION INTERNAL FIXATION (ORIF) ANKLE FRACTURE;  Surgeon: Sanjuana Kava, MD;  Location: [facility];  Service: Orthopedics;  Laterality: Right;   TUBAL LIGATION      Family History  Problem Relation Age of Onset   Hypertension Mother    Diabetes Mother    Hypertension Father    Heart disease Father    Cancer Sister        brain, breast   Hypertension Sister    Heart disease Brother        congestive heart failure   Diabetes Sister    Hypertension Sister    Hypertension Son    Diabetes Son    Diabetes Maternal Grandmother    Diabetes Paternal Grandmother       Controlled substance contract: n/a     Review of Systems  Constitutional:  Negative for diaphoresis.  Eyes:  Negative for pain.  Respiratory:  Negative for shortness of breath.  Cardiovascular:  Negative for chest pain, palpitations and leg swelling.  Gastrointestinal:  Negative for abdominal pain.  Endocrine: Negative for polydipsia.  Skin:  Negative for rash.  Neurological:  Negative for dizziness, weakness and headaches.  Hematological:  Does not bruise/bleed easily.  All other systems reviewed and are negative.     Objective:   Physical Exam Vitals and nursing note reviewed.  Constitutional:      General: She is not in acute distress.    Appearance: Normal appearance. She is well-developed.  HENT:     Head: Normocephalic.     Right Ear: Tympanic membrane normal.     Left Ear: Tympanic membrane normal.     Nose: Nose normal.     Mouth/Throat:     Mouth:  Mucous membranes are moist.  Eyes:     Pupils: Pupils are equal, round, and reactive to light.  Neck:     Vascular: No carotid bruit or JVD.  Cardiovascular:     Rate and Rhythm: Normal rate and regular rhythm.     Heart sounds: Normal heart sounds.  Pulmonary:     Effort: Pulmonary effort is normal. No respiratory distress.     Breath sounds: Normal breath sounds. No wheezing or rales.  Chest:     Chest wall: No tenderness.  Abdominal:     General: Bowel sounds are normal. There is no distension or abdominal bruit.     Palpations: Abdomen is soft. There is no hepatomegaly, splenomegaly, mass or pulsatile mass.     Tenderness: There is no abdominal tenderness.  Musculoskeletal:        General: Normal range of motion.     Cervical back: Normal range of motion and neck supple.  Lymphadenopathy:     Cervical: No cervical adenopathy.  Skin:    General: Skin is warm and dry.  Neurological:     Mental Status: She is alert and oriented to person, place, and time.     Deep Tendon Reflexes: Reflexes are normal and symmetric.  Psychiatric:        Behavior: Behavior normal.        Thought Content: Thought content normal.        Judgment: Judgment normal.    BP 112/72   Pulse 89   Temp 98.2 F (36.8 C) (Temporal)   Resp 20   Ht '5\' 4"'$  (1.626 m)   Wt 136 lb (61.7 kg)   LMP 05/25/1996   SpO2 99%   BMI 23.34 kg/m        Assessment & Plan:  Regina Knox comes in today with chief complaint of Feels like she has inflammation in colon (Discuss labs done last week/)   Diagnosis and orders addressed:  1. Essential hypertension, benign Low sodium diet - olmesartan-hydrochlorothiazide (BENICAR HCT) 40-25 MG tablet; TAKE ONE (1) TABLET EACH DAY  Dispense: 90 tablet; Refill: 1  2. Irregular heart beat Report any palpitations  3. Mild persistent chronic asthma without complication Continue inhaler as prescribed  4. BMI 27.0-27.9,adult Discussed diet and exercise for person  with BMI >25 Will recheck weight in 3-6 months   5. C. difficile colitis Referral to gi - Ambulatory referral to Gastroenterology - predniSONE (DELTASONE) 20 MG tablet; Take 2 tablets (40 mg total) by mouth daily with breakfast for 5 days. 2 po daily for 5 days  Dispense: 10 tablet; Refill: 0   Labs pending Health Maintenance reviewed Diet and exercise encouraged  Follow up plan: 3 months  Mary-Margaret Gloria Lambertson, FNP   

## 2021-07-03 NOTE — Patient Instructions (Signed)
Colitis  Colitis is a condition in which the colon is inflamed. It can cause diarrhea, blood in the stool, and abdominal pain. Colitis can last a short time (be acute), or it may last a long time (become chronic). What are the causes? This condition may be caused by: Infections from viruses or bacteria. A reaction to medicine. Certain autoimmune diseases, such as Crohn's disease or ulcerative colitis. Radiation treatment. Decreased blood flow to the bowel (ischemia). What are the signs or symptoms? Symptoms of this condition include: Diarrhea, blood in the stool, or black, tarry stool. Pain in the joints or abdominal pain. Fever or fatigue. Vomiting. Weight loss. Bloating. Having fewer bowel movements than usual. A strong and sudden urge to have a bowel movement. Feeling like the bowel is not empty after a bowel movement. How is this diagnosed? This condition may be diagnosed based on a stool test and a blood test. You may also have other tests, such as: X-rays. CT scan. Colonoscopy. Endoscopy. Biopsy. How is this treated? Treatment for this condition depends on the cause. This condition may be treated with: Steps to rest the bowel, such as not eating or drinking for a period of time. Fluids that are given through an IV. Medicine for pain and diarrhea. Antibiotic medicines. Cortisone medicines. Surgery. Follow these instructions at home: Eating and drinking  Follow instructions from your health care provider about eating or drinking restrictions. Drink enough fluid to keep your urine pale yellow. Work with a dietitian to determine whether certain foods cause your condition to flare up. Avoid foods or drinks that cause flare-ups. Eat a well-balanced diet. General instructions If you were prescribed an antibiotic medicine, take it as told by your health care provider. Do not stop taking the antibiotic even if you start to feel better. Take over-the-counter and prescription  medicines only as told by your health care provider. Keep all follow-up visits. This is important. Contact a health care provider if: Your symptoms do not go away. You develop new symptoms. Get help right away if: You have a fever that does not go away with treatment. You develop chills. You have extreme weakness, fainting, or dehydration. You vomit repeatedly. You develop severe pain in your abdomen. You pass bloody or tarry stool. Summary Colitis is a condition in which the colon is inflamed. Colitis can last a short time (be acute), or it may last a long time (become chronic). Treatment for this condition depends on the cause and may include resting the bowel, taking medicines, or having surgery. If you were prescribed an antibiotic medicine, take it as told by your health care provider. Do not stop taking the antibiotic even if you start to feel better. Get help right away if you develop severe pain in your abdomen. Keep all follow-up visits. This is important. This information is not intended to replace advice given to you by your health care provider. Make sure you discuss any questions you have with your health care provider. Document Revised: 10/10/2019 Document Reviewed: 10/10/2019 Elsevier Patient Education  2023 Elsevier Inc.  

## 2021-07-07 ENCOUNTER — Encounter: Payer: Self-pay | Admitting: Gastroenterology

## 2021-07-21 ENCOUNTER — Other Ambulatory Visit: Payer: Medicare HMO

## 2021-07-21 DIAGNOSIS — E559 Vitamin D deficiency, unspecified: Secondary | ICD-10-CM

## 2021-07-21 DIAGNOSIS — D509 Iron deficiency anemia, unspecified: Secondary | ICD-10-CM

## 2021-07-22 ENCOUNTER — Encounter: Payer: Self-pay | Admitting: Family Medicine

## 2021-07-22 LAB — CBC WITH DIFFERENTIAL/PLATELET
Basophils Absolute: 0.1 10*3/uL (ref 0.0–0.2)
Basos: 1 %
EOS (ABSOLUTE): 0.2 10*3/uL (ref 0.0–0.4)
Eos: 3 %
Hematocrit: 37.8 % (ref 34.0–46.6)
Hemoglobin: 11.8 g/dL (ref 11.1–15.9)
Immature Grans (Abs): 0 10*3/uL (ref 0.0–0.1)
Immature Granulocytes: 0 %
Lymphocytes Absolute: 1.3 10*3/uL (ref 0.7–3.1)
Lymphs: 18 %
MCH: 26.8 pg (ref 26.6–33.0)
MCHC: 31.2 g/dL — ABNORMAL LOW (ref 31.5–35.7)
MCV: 86 fL (ref 79–97)
Monocytes Absolute: 0.5 10*3/uL (ref 0.1–0.9)
Monocytes: 8 %
Neutrophils Absolute: 5 10*3/uL (ref 1.4–7.0)
Neutrophils: 70 %
Platelets: 374 10*3/uL (ref 150–450)
RBC: 4.4 x10E6/uL (ref 3.77–5.28)
RDW: 22.1 % — ABNORMAL HIGH (ref 11.7–15.4)
WBC: 7.1 10*3/uL (ref 3.4–10.8)

## 2021-07-22 LAB — VITAMIN D 25 HYDROXY (VIT D DEFICIENCY, FRACTURES): Vit D, 25-Hydroxy: 38.2 ng/mL (ref 30.0–100.0)

## 2021-07-29 ENCOUNTER — Ambulatory Visit: Payer: Medicare HMO | Admitting: Gastroenterology

## 2021-07-29 ENCOUNTER — Telehealth: Payer: Self-pay | Admitting: *Deleted

## 2021-07-29 ENCOUNTER — Encounter: Payer: Self-pay | Admitting: Gastroenterology

## 2021-07-29 VITALS — BP 130/80 | HR 91 | Ht 64.0 in | Wt 135.0 lb

## 2021-07-29 DIAGNOSIS — K6289 Other specified diseases of anus and rectum: Secondary | ICD-10-CM | POA: Diagnosis not present

## 2021-07-29 DIAGNOSIS — R194 Change in bowel habit: Secondary | ICD-10-CM

## 2021-07-29 DIAGNOSIS — Z8619 Personal history of other infectious and parasitic diseases: Secondary | ICD-10-CM

## 2021-07-29 DIAGNOSIS — K625 Hemorrhage of anus and rectum: Secondary | ICD-10-CM | POA: Diagnosis not present

## 2021-07-29 MED ORDER — RIFAXIMIN 550 MG PO TABS: 550.0000 mg | ORAL_TABLET | Freq: Three times a day (TID) | ORAL | 0 refills | Status: AC

## 2021-07-29 MED ORDER — NA SULFATE-K SULFATE-MG SULF 17.5-3.13-1.6 GM/177ML PO SOLN: 1.0000 | Freq: Once | ORAL | 0 refills | Status: AC

## 2021-07-29 NOTE — Telephone Encounter (Signed)
PRIOR AUTHORIZATION  PA initiation date: 07/29/21  Medication: Xifaxan 550 mg Insurance Company: [employer] completed electronically through [employer] My Meds: Yes  Cover My Meds Request Key: [id number] Patient Last Name: Regina Knox DOB: 1946/01/30  Will await insurance response re: approval/denial.

## 2021-07-29 NOTE — Progress Notes (Signed)
Referring Provider: Chevis Pretty, * Primary Care Physician:  Chevis Pretty, FNP  Reason for Consultation:  Altered bowel habits following C Diff   IMPRESSION:  Altered bowel habits following C Diff diagnosed by PCR Rectal discomfort Intermittent rectal bleeding  ? Post infectious IBS versus IBD versus microscopic colitis  Iron deficiency anemia without overt bleeding  PLAN: - Colonoscopy with random colon biopsies - Trial of Xifaxan 550 mg TID x 14 days, may substitute with doxycycline 100 mg BID x 14 days - May need EGD if colonoscopy is non-diagnostic. She wished to only pursue colonoscopy now.   HPI: Regina Knox is a 76 y.o. female referred by NP Hassell Done for further evaluation of altered bowel habits after C Diff colitis. The history is obtained through the patient and review of her electronic health record.   Diagnosed with C Diff in January. Treated with two rounds of vancomycin 250 mg QID x 10 days because she felt only partial relief from the first course of treatment. Bowel habits have not been the same since then. Baseline bowel habits were a normal bowel movement most days. Since C Diff, she has some pain with defecation that lingers, with altered and fluctuating stool consistency and frequency . There has been some straining. Intermittent blood in the stool and on the toilet paper. She attributes this to hemorrhoids that have bothered her intermittently since the birth of her son. No related abdominal pain. No significant change in symptoms despite empiric treatment with prednisone for 5 days last month.  Developed iron deficiency anemia since she had C diff without overt bleeding. Oral iron supplements over the last 5 weeks have caused further change in her bowel movements.  Hemoglobin 9.4 06/24/21. Increased to 11.8 on recent recheck.   She added fiber supplement tablets. She has started low with plans to increase as she tolerates as she read about the  potential for bloating on the internet.   Rare GERD that she controls with diet choices. GI ROS is otherwise negative.   She has never had a colonoscopy. No prior abdominal imaging.   There is no known family history of colon cancer or polyps. No family history of stomach cancer or other GI malignancy. No family history of inflammatory bowel disease or celiac.    Past Medical History:  Diagnosis Date   Allergy    Anemia    Asthma    Elevated cholesterol    GERD (gastroesophageal reflux disease)    Hypertension     Past Surgical History:  Procedure Laterality Date   BREAST SURGERY  80's   right breast   ORIF ANKLE FRACTURE Right 11/06/2013   Procedure: OPEN REDUCTION INTERNAL FIXATION (ORIF) ANKLE FRACTURE;  Surgeon: Sanjuana Kava, MD;  Location: [facility];  Service: Orthopedics;  Laterality: Right;   TUBAL LIGATION      Prior to Admission medications   Medication Sig Start Date End Date Taking? Authorizing Provider  fluticasone (FLONASE) 50 MCG/ACT nasal spray Place 2 sprays into both nostrils daily. 02/25/21  Yes Martin, Mary-Margaret, FNP  Fluticasone Furoate (ARNUITY ELLIPTA) 100 MCG/ACT AEPB Inhale 1 Dose into the lungs daily. Take one inhalation orally daily. 02/25/21  Yes Martin, Mary-Margaret, FNP  Iron, Ferrous Sulfate, 325 (65 Fe) MG TABS Take 325 mg by mouth daily. 06/25/21  Yes Rakes, Connye Burkitt, FNP  olmesartan-hydrochlorothiazide (BENICAR HCT) 40-25 MG tablet TAKE ONE (1) TABLET EACH DAY 07/03/21  Yes Hassell Done, Mary-Margaret, FNP  Vitamin D, Ergocalciferol, (DRISDOL) 1.25 MG (50000 UNIT)  CAPS capsule Take 1 capsule (50,000 Units total) by mouth every 7 (seven) days. 06/25/21  Yes RakesConnye Burkitt, FNP    Current Outpatient Medications  Medication Sig Dispense Refill   fluticasone (FLONASE) 50 MCG/ACT nasal spray Place 2 sprays into both nostrils daily. 16 g 5   Fluticasone Furoate (ARNUITY ELLIPTA) 100 MCG/ACT AEPB Inhale 1 Dose into the lungs daily. Take one inhalation orally  daily. 30 each 6   Iron, Ferrous Sulfate, 325 (65 Fe) MG TABS Take 325 mg by mouth daily. 30 tablet 3   Na Sulfate-K Sulfate-Mg Sulf 17.5-3.13-1.6 GM/177ML SOLN Take 1 kit by mouth once for 1 dose. 354 mL 0   olmesartan-hydrochlorothiazide (BENICAR HCT) 40-25 MG tablet TAKE ONE (1) TABLET EACH DAY 90 tablet 1   rifaximin (XIFAXAN) 550 MG TABS tablet Take 1 tablet (550 mg total) by mouth 3 (three) times daily for 14 days. 42 tablet 0   Vitamin D, Ergocalciferol, (DRISDOL) 1.25 MG (50000 UNIT) CAPS capsule Take 1 capsule (50,000 Units total) by mouth every 7 (seven) days. 5 capsule 3   No current facility-administered medications for this visit.    Allergies as of 07/29/2021 - Review Complete 07/29/2021  Allergen Reaction Noted   Ibuprofen Rash 05/25/2012    Family History  Problem Relation Age of Onset   Hypertension Mother    Diabetes Mother    Hypertension Father    Heart disease Father    Cancer Sister        brain, breast   Hypertension Sister    Diabetes Sister    Hypertension Sister    Heart disease Brother        congestive heart failure   Diabetes Maternal Grandmother    Diabetes Paternal Grandmother    Hypertension Son    Diabetes Son    Stomach cancer Neg Hx    Esophageal cancer Neg Hx    Colon cancer Neg Hx     Social History   Socioeconomic History   Marital status: Widowed    Spouse name: Jori Moll   Number of children: 1   Years of education: 12   Highest education level: 12th grade  Occupational History   Occupation: Retired  Tobacco Use   Smoking status: Never   Smokeless tobacco: Never  Vaping Use   Vaping Use: Never used  Substance and Sexual Activity   Alcohol use: No   Drug use: No   Sexual activity: Not Currently  Other Topics Concern   Not on file  Social History Narrative   Husband passed 2021   Son lives in [place] Determinants of Health   Financial Resource Strain: Low Risk  (10/28/2020)   Overall Financial Resource  Strain (CARDIA)    Difficulty of Paying Living Expenses: Not hard at all  Food Insecurity: No Food Insecurity (10/28/2020)   Hunger Vital Sign    Worried About Running Out of Food in the Last Year: Never true    [place] in the Last Year: Never true  Transportation Needs: No Transportation Needs (10/28/2020)   PRAPARE - Hydrologist (Medical): No    Lack of Transportation (Non-Medical): No  Physical Activity: Insufficiently Active (10/28/2020)   Exercise Vital Sign    Days of Exercise per Week: 7 days    Minutes of Exercise per Session: 20 min  Stress: No Stress Concern Present (10/28/2020)   [place]  Feeling of Stress : Not at all  Social Connections: Moderately Integrated (10/28/2020)   Social Connection and Isolation Panel [NHANES]    Frequency of Communication with Friends and Family: More than three times a week    Frequency of Social Gatherings with Friends and Family: Twice a week    Attends Religious Services: More than 4 times per year    Active Member of [employer] or Organizations: Yes    Attends Archivist Meetings: More than 4 times per year    Marital Status: Widowed  Intimate Partner Violence: Not At Risk (10/28/2020)   Humiliation, Afraid, Rape, and Kick questionnaire    Fear of Current or Ex-Partner: No    Emotionally Abused: No    Physically Abused: No    Sexually Abused: No    Review of Systems: 12 system ROS is negative except as noted above with the addition of Vitamin D deficiency.   Physical Exam: General:   Alert,  well-nourished, pleasant and cooperative in NAD Head:  Normocephalic and atraumatic. Eyes:  Sclera clear, no icterus.   Conjunctiva pink. Ears:  Normal auditory acuity. Nose:  No deformity, discharge,  or lesions. Mouth:  No deformity or lesions.   Neck:  Supple; no masses or thyromegaly. Lungs:  Clear throughout to auscultation.   No  wheezes. Heart:  Regular rate and rhythm; no murmurs. Abdomen:  Soft, nontender, nondistended, normal bowel sounds, no rebound or guarding. No hepatosplenomegaly.   Rectal:  Deferred  Msk:  Symmetrical. No boney deformities LAD: No inguinal or umbilical LAD Extremities:  No clubbing or edema. Neurologic:  Alert and  oriented x4;  grossly nonfocal Skin:  Intact without significant lesions or rashes. Psych:  Alert and cooperative. Normal mood and affect.    Shaily Librizzi L. Tarri Glenn, MD, MPH 07/29/2021, 8:06 PM

## 2021-07-29 NOTE — Telephone Encounter (Signed)
APPROVAL  Medication: Xifaxan 550 mg Insurance Company: [employer] [place] response: Approved Approval dates: 02/16/2021-08/12/2021

## 2021-07-29 NOTE — Patient Instructions (Addendum)
It was my pleasure to provide care to you today. Based on our discussion, I am providing you with my recommendations below:  RECOMMENDATION(S):   PRESCRIPTION MEDICATION(S):   We have sent the following medication(s) to your pharmacy:  Xifaxan 550 mg three times daily for 14 days.  NOTE: If your medication(s) requires a PRIOR AUTHORIZATION, we will receive notification from your pharmacy. Once received, the process to submit for approval may take up to 7-10 business days. You will be contacted about any denials we have received from your insurance company as well as alternatives recommended by your provider.   COLONOSCOPY:   You have been scheduled for a colonoscopy. Please follow written instructions given to you at your visit today.   PREP:   Please pick up your prep supplies at the pharmacy within the next 1-3 days.  INHALERS:   If you use inhalers (even only as needed), please bring them with you on the day of your procedure.   COLONOSCOPY TIPS:  To reduce nausea and dehydration, stay well hydrated for 3-4 days prior to the exam.  To prevent skin/hemorrhoid irritation - prior to wiping, put A&Dointment or vaseline on the toilet paper. Keep a towel or pad on the bed.  BEFORE STARTING YOUR PREP, drink  64oz of clear liquids in the morning. This will help to flush the colon and will ensure you are well hydrated!!!!  NOTE - This is in addition to the fluids required for to complete your prep. Use of a flavored hard candy, such as grape Anise Salvo, can counteract some of the flavor of the prep and may prevent some nausea.    FOLLOW UP:  After your procedure, you will receive a call from my office staff regarding my recommendation for follow up.  BMI:  If you are age 54 or older, your body mass index should be between 23-30. Your Body mass index is 23.17 kg/m. If this is out of the aforementioned range listed, please consider follow up with your Primary Care  Provider.  If you are age 69 or younger, your body mass index should be between 19-25. Your Body mass index is 23.17 kg/m. If this is out of the aformentioned range listed, please consider follow up with your Primary Care Provider.   MY CHART:  The [facility] providers would like to encourage you to use [facility] to communicate with providers for non-urgent requests or questions.  Due to long hold times on the telephone, sending your provider a message by [facility] may be a faster and more efficient way to get a response.  Please allow 48 business hours for a response.  Please remember that this is for non-urgent requests.   Thank you for trusting me with your gastrointestinal care!    Thornton Park, MD, MPH

## 2021-08-05 ENCOUNTER — Encounter: Payer: Self-pay | Admitting: Gastroenterology

## 2021-08-12 ENCOUNTER — Encounter: Payer: Self-pay | Admitting: Gastroenterology

## 2021-08-12 ENCOUNTER — Ambulatory Visit (AMBULATORY_SURGERY_CENTER): Payer: Medicare HMO | Admitting: Gastroenterology

## 2021-08-12 ENCOUNTER — Other Ambulatory Visit: Payer: Self-pay

## 2021-08-12 ENCOUNTER — Telehealth: Payer: Self-pay

## 2021-08-12 ENCOUNTER — Other Ambulatory Visit (INDEPENDENT_AMBULATORY_CARE_PROVIDER_SITE_OTHER): Payer: Medicare HMO

## 2021-08-12 VITALS — BP 137/49 | HR 93 | Temp 98.7°F | Resp 15 | Ht 64.0 in | Wt 135.0 lb

## 2021-08-12 DIAGNOSIS — C20 Malignant neoplasm of rectum: Secondary | ICD-10-CM

## 2021-08-12 DIAGNOSIS — D122 Benign neoplasm of ascending colon: Secondary | ICD-10-CM

## 2021-08-12 DIAGNOSIS — R194 Change in bowel habit: Secondary | ICD-10-CM

## 2021-08-12 DIAGNOSIS — K6289 Other specified diseases of anus and rectum: Secondary | ICD-10-CM

## 2021-08-12 DIAGNOSIS — I10 Essential (primary) hypertension: Secondary | ICD-10-CM | POA: Diagnosis not present

## 2021-08-12 LAB — CBC WITH DIFFERENTIAL/PLATELET
Basophils Absolute: 0.1 10*3/uL (ref 0.0–0.1)
Basophils Relative: 0.8 % (ref 0.0–3.0)
Eosinophils Absolute: 0.2 10*3/uL (ref 0.0–0.7)
Eosinophils Relative: 2 % (ref 0.0–5.0)
HCT: 35.8 % — ABNORMAL LOW (ref 36.0–46.0)
Hemoglobin: 11.5 g/dL — ABNORMAL LOW (ref 12.0–15.0)
Lymphocytes Relative: 13.6 % (ref 12.0–46.0)
Lymphs Abs: 1.1 10*3/uL (ref 0.7–4.0)
MCHC: 32.3 g/dL (ref 30.0–36.0)
MCV: 85.7 fl (ref 78.0–100.0)
Monocytes Absolute: 0.6 10*3/uL (ref 0.1–1.0)
Monocytes Relative: 6.8 % (ref 3.0–12.0)
Neutro Abs: 6.3 10*3/uL (ref 1.4–7.7)
Neutrophils Relative %: 76.8 % (ref 43.0–77.0)
Platelets: 379 10*3/uL (ref 150.0–400.0)
RBC: 4.17 Mil/uL (ref 3.87–5.11)
RDW: 23.6 % — ABNORMAL HIGH (ref 11.5–15.5)
WBC: 8.2 10*3/uL (ref 4.0–10.5)

## 2021-08-12 LAB — COMPREHENSIVE METABOLIC PANEL
ALT: 10 U/L (ref 0–35)
AST: 16 U/L (ref 0–37)
Albumin: 3.8 g/dL (ref 3.5–5.2)
Alkaline Phosphatase: 66 U/L (ref 39–117)
BUN: 11 mg/dL (ref 6–23)
CO2: 28 mEq/L (ref 19–32)
Calcium: 8.7 mg/dL (ref 8.4–10.5)
Chloride: 103 mEq/L (ref 96–112)
Creatinine, Ser: 0.71 mg/dL (ref 0.40–1.20)
GFR: 81.67 mL/min (ref >60.00)
Glucose, Bld: 115 mg/dL — ABNORMAL HIGH (ref 70–99)
Potassium: 3.7 mEq/L (ref 3.5–5.1)
Sodium: 142 mEq/L (ref 135–145)
Total Bilirubin: 0.3 mg/dL (ref 0.2–1.2)
Total Protein: 6.2 g/dL (ref 6.0–8.3)

## 2021-08-12 MED ORDER — SODIUM CHLORIDE 0.9 % IV SOLN: 500.0000 mL | Freq: Once | INTRAVENOUS | Status: DC

## 2021-08-12 NOTE — Progress Notes (Signed)
Called to room to assist during endoscopic procedure.  Patient ID and intended procedure confirmed with present staff. Received instructions for my participation in the procedure from the performing physician.  

## 2021-08-12 NOTE — Telephone Encounter (Signed)
Both lab orders & CT have been placed. [facility] recovery RN has been notified that patient will need labs collected prior to leaving today & will make patient aware. Schedulers have been notified of CT. Referral faxed to [facility].

## 2021-08-13 ENCOUNTER — Telehealth: Payer: Self-pay

## 2021-08-13 LAB — CEA: CEA: 3.4 ng/mL — ABNORMAL HIGH

## 2021-08-13 NOTE — Telephone Encounter (Signed)
  Follow up Call-     08/12/2021    2:05 PM  Call back number  Post procedure Call Back phone  # [phone]  Permission to leave phone message Yes     Patient questions:  Do you have a fever, pain , or abdominal swelling? No. Pain Score  0 *  Have you tolerated food without any problems? Yes.    Have you been able to return to your normal activities? Yes.    Do you have any questions about your discharge instructions: Diet   No. Medications  No. Follow up visit  No.  Do you have questions or concerns about your Care? No.  Actions: * If pain score is 4 or above: No action needed, pain <4.

## 2021-08-13 NOTE — Telephone Encounter (Signed)
Received call from Dr. Thornell Sartorius with [facility] that patient's path confirmed adenocarcinoma of most recent rectal mass. Will route to Dr. Tarri Glenn to make her aware.

## 2021-08-14 NOTE — Telephone Encounter (Signed)
Patient has been scheduled for CT tomorrow 6/30 at 5pm at [facility]. Patient is aware & already has contrast at home. Amy Hazelwood notified of order as well in case prior Josem Kaufmann is needed.

## 2021-08-15 ENCOUNTER — Ambulatory Visit (HOSPITAL_COMMUNITY): Payer: Medicare HMO

## 2021-08-15 NOTE — Telephone Encounter (Signed)
Per Melissa with radiology, patient's CT has been rescheduled for 7/6 at 4:30 pm d/t not receiving insurance confirmation.

## 2021-08-18 DIAGNOSIS — C20 Malignant neoplasm of rectum: Secondary | ICD-10-CM | POA: Diagnosis not present

## 2021-08-21 ENCOUNTER — Ambulatory Visit (HOSPITAL_COMMUNITY)
Admission: RE | Admit: 2021-08-21 | Discharge: 2021-08-21 | Disposition: A | Discharge status: Home / Self Care | Payer: Medicare HMO | Source: Ambulatory Visit | Attending: Gastroenterology | Admitting: Gastroenterology

## 2021-08-21 DIAGNOSIS — K6289 Other specified diseases of anus and rectum: Secondary | ICD-10-CM | POA: Diagnosis not present

## 2021-08-21 DIAGNOSIS — N281 Cyst of kidney, acquired: Secondary | ICD-10-CM | POA: Diagnosis not present

## 2021-08-21 DIAGNOSIS — C20 Malignant neoplasm of rectum: Secondary | ICD-10-CM | POA: Diagnosis not present

## 2021-08-21 DIAGNOSIS — K769 Liver disease, unspecified: Secondary | ICD-10-CM | POA: Diagnosis not present

## 2021-08-21 DIAGNOSIS — R918 Other nonspecific abnormal finding of lung field: Secondary | ICD-10-CM | POA: Diagnosis not present

## 2021-08-21 MED ORDER — IOHEXOL 300 MG/ML  SOLN
100.0000 mL | Freq: Once | INTRAMUSCULAR | Status: AC | PRN
  Administered 2021-08-21: 100 mL via INTRAVENOUS

## 2021-08-22 ENCOUNTER — Other Ambulatory Visit: Payer: Self-pay | Admitting: General Surgery

## 2021-08-22 ENCOUNTER — Other Ambulatory Visit (HOSPITAL_COMMUNITY): Payer: Self-pay

## 2021-08-22 DIAGNOSIS — C20 Malignant neoplasm of rectum: Secondary | ICD-10-CM

## 2021-08-25 ENCOUNTER — Encounter: Payer: Self-pay | Admitting: Nurse Practitioner

## 2021-08-25 ENCOUNTER — Ambulatory Visit (INDEPENDENT_AMBULATORY_CARE_PROVIDER_SITE_OTHER): Payer: Medicare HMO | Admitting: Nurse Practitioner

## 2021-08-25 VITALS — BP 112/74 | HR 86 | Temp 98.7°F | Resp 20 | Ht 64.0 in | Wt 129.0 lb

## 2021-08-25 DIAGNOSIS — E559 Vitamin D deficiency, unspecified: Secondary | ICD-10-CM

## 2021-08-25 DIAGNOSIS — J4530 Mild persistent asthma, uncomplicated: Secondary | ICD-10-CM

## 2021-08-25 DIAGNOSIS — H6123 Impacted cerumen, bilateral: Secondary | ICD-10-CM

## 2021-08-25 DIAGNOSIS — D509 Iron deficiency anemia, unspecified: Secondary | ICD-10-CM

## 2021-08-25 DIAGNOSIS — Z6827 Body mass index (BMI) 27.0-27.9, adult: Secondary | ICD-10-CM

## 2021-08-25 DIAGNOSIS — I10 Essential (primary) hypertension: Secondary | ICD-10-CM | POA: Diagnosis not present

## 2021-08-25 MED ORDER — ARNUITY ELLIPTA 100 MCG/ACT IN AEPB: 1.0000 | INHALATION_SPRAY | Freq: Every day | RESPIRATORY_TRACT | 6 refills | Status: DC

## 2021-08-25 MED ORDER — IRON (FERROUS SULFATE) 325 (65 FE) MG PO TABS: 325.0000 mg | ORAL_TABLET | Freq: Every day | ORAL | 3 refills | Status: DC

## 2021-08-25 MED ORDER — VITAMIN D (ERGOCALCIFEROL) 1.25 MG (50000 UNIT) PO CAPS
50000.0000 [IU] | ORAL_CAPSULE | ORAL | 3 refills | Status: DC
Start: 2021-08-25 — End: 2021-11-26

## 2021-08-25 MED ORDER — OLMESARTAN MEDOXOMIL-HCTZ 40-25 MG PO TABS: ORAL_TABLET | ORAL | 1 refills | Status: DC

## 2021-08-25 NOTE — Patient Instructions (Signed)

## 2021-08-25 NOTE — Progress Notes (Signed)
Subjective:    Patient ID: Regina Knox, female    DOB: 26-May-1945, 76 y.o.   MRN: [id number]   Chief Complaint: medical management of chronic issues     HPI:  Regina Knox is a 76 y.o. who identifies as a female who was assigned female at birth.   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 Essential hypertension, benign No c/o chest pain, sob or headache. Does not check blood pressure at home. BP Readings from Last 3 Encounters:  08/12/21 (!) 137/49  07/29/21 130/80  07/03/21 112/72     2. Mild persistent chronic asthma without complication Uses arnuity daily. Is doing well. Denies wheezing or cough.  3. BMI 27.0-27.9,adult Weight is down 6lbs Wt Readings from Last 3 Encounters:  08/25/21 129 lb (58.5 kg)  08/12/21 135 lb (61.2 kg)  07/29/21 135 lb (61.2 kg)   BP Readings from Last 3 Encounters:  08/25/21 112/74  08/12/21 (!) 137/49  07/29/21 130/80       New complaints: Recently dx with rectal cancer- they do not think it has spread so far. She has follow up with surgeon on July 26 to discuss surgery.  Allergies  Allergen Reactions   Ibuprofen Rash   Outpatient Encounter Medications as of 08/25/2021  Medication Sig   fluticasone (FLONASE) 50 MCG/ACT nasal spray Place 2 sprays into both nostrils daily.   Fluticasone Furoate (ARNUITY ELLIPTA) 100 MCG/ACT AEPB Inhale 1 Dose into the lungs daily. Take one inhalation orally daily.   Iron, Ferrous Sulfate, 325 (65 Fe) MG TABS Take 325 mg by mouth daily.   olmesartan-hydrochlorothiazide (BENICAR HCT) 40-25 MG tablet TAKE ONE (1) TABLET EACH DAY   Vitamin D, Ergocalciferol, (DRISDOL) 1.25 MG (50000 UNIT) CAPS capsule Take 1 capsule (50,000 Units total) by mouth every 7 (seven) days.   No facility-administered encounter medications on file as of 08/25/2021.    Past Surgical History:  Procedure Laterality Date    BREAST SURGERY  80's   right breast   ORIF ANKLE FRACTURE Right 11/06/2013   Procedure: OPEN REDUCTION INTERNAL FIXATION (ORIF) ANKLE FRACTURE;  Surgeon: Sanjuana Kava, MD;  Location: [facility];  Service: Orthopedics;  Laterality: Right;   TUBAL LIGATION      Family History  Problem Relation Age of Onset   Hypertension Mother    Diabetes Mother    Hypertension Father    Heart disease Father    Cancer Sister        brain, breast   Hypertension Sister    Diabetes Sister    Hypertension Sister    Heart disease Brother        congestive heart failure   Diabetes Maternal Grandmother    Diabetes Paternal Grandmother    Hypertension Son    Diabetes Son    Stomach cancer Neg Hx    Esophageal cancer Neg Hx    Colon cancer Neg Hx       Controlled substance contract: n/a     Review of Systems  Constitutional:  Negative for diaphoresis.  Eyes:  Negative for pain.  Respiratory:  Negative for shortness of breath.   Cardiovascular:  Negative for chest pain, palpitations and leg swelling.  Gastrointestinal:  Negative for abdominal pain.  Endocrine: Negative for polydipsia.  Skin:  Negative for rash.  Neurological:  Negative for dizziness, weakness and headaches.  Hematological:  Does not bruise/bleed easily.  All other systems reviewed and are negative.      Objective:   Physical Exam Vitals and nursing note reviewed.  Constitutional:      General: She is not in acute distress.    Appearance: Normal appearance. She is well-developed.  HENT:     Head: Normocephalic.     Right Ear: Tympanic membrane normal. There is impacted cerumen.     Left Ear: Tympanic membrane normal. There is impacted cerumen.     Nose: Nose normal.     Mouth/Throat:     Mouth: Mucous membranes are moist.  Eyes:     Pupils: Pupils are equal, round, and reactive to light.  Neck:     Vascular: No carotid bruit or JVD.  Cardiovascular:     Rate and Rhythm: Normal rate and regular rhythm.     Heart  sounds: Normal heart sounds.  Pulmonary:     Effort: Pulmonary effort is normal. No respiratory distress.     Breath sounds: Normal breath sounds. No wheezing or rales.  Chest:     Chest wall: No tenderness.  Abdominal:     General: Bowel sounds are normal. There is no distension or abdominal bruit.     Palpations: Abdomen is soft. There is no hepatomegaly, splenomegaly, mass or pulsatile mass.     Tenderness: There is no abdominal tenderness.  Musculoskeletal:        General: Normal range of motion.     Cervical back: Normal range of motion and neck supple.  Lymphadenopathy:     Cervical: No cervical adenopathy.  Skin:    General: Skin is warm and dry.  Neurological:     Mental Status: She is alert and oriented to person, place, and time.     Deep Tendon Reflexes: Reflexes are normal and symmetric.  Psychiatric:        Behavior: Behavior normal.        Thought Content: Thought content normal.        Judgment: Judgment normal.     BP 112/74   Pulse 86   Temp 98.7 F (37.1 C) (Temporal)   Resp 20   Ht '5\' 4"'  (1.626 m)   Wt 129 lb (58.5 kg)   LMP 05/25/1996   SpO2 98%   BMI 22.14 kg/m   S/P bil ear irrigation and curretting- TM's clear bil-      Assessment & Plan:   AURILLA COULIBALY comes in today with chief complaint of Medical Management of Chronic Issues   Diagnosis and orders addressed:  1. Essential hypertension, benign Low sodium diet - olmesartan-hydrochlorothiazide (BENICAR HCT) 40-25 MG tablet; TAKE ONE (1) TABLET EACH DAY  Dispense: 90 tablet; Refill: 1 - CBC with Differential/Platelet - CMP14+EGFR - Lipid panel  2. Mild persistent chronic asthma without complication Continue inhaler - Fluticasone Furoate (ARNUITY ELLIPTA) 100 MCG/ACT AEPB; Inhale 1 Dose into the lungs daily. Take one inhalation orally daily.  Dispense: 30 each; Refill: 6  3. BMI 27.0-27.9,adult Discussed diet and exercise for person with BMI >25 Will recheck weight in 3-6  months   4. Microcytic anemia Labs oending - Iron, Ferrous Sulfate, 325 (65 Fe) MG TABS; Take 325 mg by mouth daily.  Dispense: 30 tablet; Refill: 3  5. Vitamin D deficiency Continue vitamin d supplement - Vitamin D, Ergocalciferol, (DRISDOL) 1.25 MG (50000 UNIT) CAPS capsule; Take 1 capsule (50,000 Units total) by mouth every 7 (seven) days.  Dispense: 5 capsule; Refill: 3  6. Bilateral impacted cerumen Debrox  several times a week.   Labs pending Health Maintenance reviewed Diet and exercise encouraged  Follow up plan: 6 months   Mary-Margaret Hassell Done, FNP

## 2021-08-26 LAB — CBC WITH DIFFERENTIAL/PLATELET
Basophils Absolute: 0.1 10*3/uL (ref 0.0–0.2)
Basos: 1 %
EOS (ABSOLUTE): 0.3 10*3/uL (ref 0.0–0.4)
Eos: 4 %
Hematocrit: 38.3 % (ref 34.0–46.6)
Hemoglobin: 12.6 g/dL (ref 11.1–15.9)
Immature Grans (Abs): 0 10*3/uL (ref 0.0–0.1)
Immature Granulocytes: 0 %
Lymphocytes Absolute: 1.4 10*3/uL (ref 0.7–3.1)
Lymphs: 18 %
MCH: 28.3 pg (ref 26.6–33.0)
MCHC: 32.9 g/dL (ref 31.5–35.7)
MCV: 86 fL (ref 79–97)
Monocytes Absolute: 0.6 10*3/uL (ref 0.1–0.9)
Monocytes: 7 %
Neutrophils Absolute: 5.2 10*3/uL (ref 1.4–7.0)
Neutrophils: 70 %
Platelets: 382 10*3/uL (ref 150–450)
RBC: 4.45 x10E6/uL (ref 3.77–5.28)
RDW: 19.1 % — ABNORMAL HIGH (ref 11.7–15.4)
WBC: 7.5 10*3/uL (ref 3.4–10.8)

## 2021-08-26 LAB — CMP14+EGFR
ALT: 29 IU/L (ref 0–32)
AST: 20 IU/L (ref 0–40)
Albumin/Globulin Ratio: 1.8 (ref 1.2–2.2)
Albumin: 4.2 g/dL (ref 3.8–4.8)
Alkaline Phosphatase: 108 IU/L (ref 44–121)
BUN/Creatinine Ratio: 14 (ref 12–28)
BUN: 12 mg/dL (ref 8–27)
Bilirubin Total: 0.2 mg/dL (ref 0.0–1.2)
CO2: 25 mmol/L (ref 20–29)
Calcium: 9.6 mg/dL (ref 8.7–10.3)
Chloride: 98 mmol/L (ref 96–106)
Creatinine, Ser: 0.83 mg/dL (ref 0.57–1.00)
Globulin, Total: 2.4 g/dL (ref 1.5–4.5)
Glucose: 102 mg/dL — ABNORMAL HIGH (ref 70–99)
Potassium: 4 mmol/L (ref 3.5–5.2)
Sodium: 139 mmol/L (ref 134–144)
Total Protein: 6.6 g/dL (ref 6.0–8.5)
eGFR: 72 mL/min/{1.73_m2} (ref >59)

## 2021-08-26 LAB — LIPID PANEL
Chol/HDL Ratio: 3.6 ratio (ref 0.0–4.4)
Cholesterol, Total: 181 mg/dL (ref 100–199)
HDL: 50 mg/dL (ref >39)
LDL Chol Calc (NIH): 114 mg/dL — ABNORMAL HIGH (ref 0–99)
Triglycerides: 93 mg/dL (ref 0–149)
VLDL Cholesterol Cal: 17 mg/dL (ref 5–40)

## 2021-09-07 ENCOUNTER — Ambulatory Visit
Admission: RE | Admit: 2021-09-07 | Discharge: 2021-09-07 | Disposition: A | Discharge status: Home / Self Care | Payer: Medicare HMO | Source: Ambulatory Visit | Attending: General Surgery | Admitting: General Surgery

## 2021-09-07 DIAGNOSIS — C20 Malignant neoplasm of rectum: Secondary | ICD-10-CM

## 2021-09-10 DIAGNOSIS — C20 Malignant neoplasm of rectum: Secondary | ICD-10-CM | POA: Diagnosis not present

## 2021-09-12 ENCOUNTER — Telehealth: Payer: Self-pay | Admitting: Radiation Oncology

## 2021-09-12 NOTE — Telephone Encounter (Signed)
Called patient to schedule a consultation w. Dr. Lisbeth Renshaw. No answer, unable to LVM, line was busy on home line and VM not setup yet on mobile line.

## 2021-09-15 ENCOUNTER — Telehealth: Payer: Self-pay | Admitting: Radiation Oncology

## 2021-09-15 NOTE — Telephone Encounter (Addendum)
Called patient to schedule a consultation w. Dr. Lisbeth Renshaw. Patient stated she would like to get [place] closer to home. She has scheduled an appointment with [facility] Closing referral until further notice.

## 2021-09-16 DIAGNOSIS — C20 Malignant neoplasm of rectum: Secondary | ICD-10-CM | POA: Insufficient documentation

## 2021-09-16 NOTE — Progress Notes (Signed)
Introductory phone call placed to patient today. Introduced myself and explained my role in the patient's care. Provided a reminder of patient's upcoming appt. Patient made aware of clinic's location and visitor policy. No further questions at this time per patient.  

## 2021-09-17 ENCOUNTER — Inpatient Hospital Stay: Payer: Medicare HMO | Attending: Hematology | Admitting: Hematology

## 2021-09-17 ENCOUNTER — Other Ambulatory Visit (HOSPITAL_COMMUNITY): Payer: Self-pay

## 2021-09-17 ENCOUNTER — Encounter: Payer: Self-pay | Admitting: Lab

## 2021-09-17 ENCOUNTER — Telehealth (HOSPITAL_COMMUNITY): Payer: Self-pay | Admitting: Pharmacist

## 2021-09-17 ENCOUNTER — Telehealth: Payer: Self-pay | Admitting: Pharmacy Technician

## 2021-09-17 DIAGNOSIS — C20 Malignant neoplasm of rectum: Secondary | ICD-10-CM

## 2021-09-17 DIAGNOSIS — Z808 Family history of malignant neoplasm of other organs or systems: Secondary | ICD-10-CM | POA: Insufficient documentation

## 2021-09-17 DIAGNOSIS — D649 Anemia, unspecified: Secondary | ICD-10-CM | POA: Insufficient documentation

## 2021-09-17 MED ORDER — CAPECITABINE 500 MG PO TABS
1500.0000 mg | ORAL_TABLET | Freq: Two times a day (BID) | ORAL | 0 refills | Status: DC
  Filled 2021-09-17 – 2021-09-22 (×2): qty 168, 28d supply, fill #0

## 2021-09-17 MED ORDER — PROCHLORPERAZINE MALEATE 10 MG PO TABS
10.0000 mg | ORAL_TABLET | Freq: Four times a day (QID) | ORAL | 4 refills | Status: DC | PRN
  Filled 2021-09-17: qty 60, 15d supply, fill #0

## 2021-09-17 MED ORDER — CAPECITABINE 500 MG PO TABS
1500.0000 mg | ORAL_TABLET | Freq: Two times a day (BID) | ORAL | 0 refills | Status: DC
  Filled 2021-09-17: qty 180, 30d supply, fill #0

## 2021-09-17 NOTE — Progress Notes (Unsigned)
Referral sent to [facility].  Records faxed on 8/2

## 2021-09-17 NOTE — Progress Notes (Signed)
[facility] [address], [place] [address]   CLINIC:  Medical Oncology/Hematology  CONSULT NOTE  Patient Care Team: Chevis Pretty, [place] as PCP - General (Nurse Practitioner) Derek Jack, MD as Medical Oncologist (Medical Oncology) Brien Mates, RN as Oncology Nurse Navigator (Medical Oncology)  CHIEF COMPLAINTS/PURPOSE OF CONSULTATION:  Evaluation for stage 3 rectal cancer  HISTORY OF PRESENTING ILLNESS:  Ms. Regina Knox 76 y.o. female is here because of evaluation for stage 3 rectal cancer, at the request of Dr. Marcello Moores.  Today she reports feeling good. She reports loose diarrhea and nausea. She reports numbness in her left foot since 2015 due to a back issue. She reports hematochezia. She reports losing 24 lbs over 6 months and denies intentional weight loss. She reports an episode of C. Diff in January. She does not take stool softener. Her activity has been limited due to the episodes of diarrhea. She reports rectal pain with frequent BM. She denies abdominal pain. She expresses preference against chemotherapy at this time.  She lives at home on her own. 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 She denies smoking history. Her sister had metastatic breast cancer.   MEDICAL HISTORY:  Past Medical History:  Diagnosis Date   Allergy    Anemia    Asthma    Elevated cholesterol    GERD (gastroesophageal reflux disease)    Hypertension     SURGICAL HISTORY: Past Surgical History:  Procedure Laterality Date   BREAST SURGERY  80's   right breast   ORIF ANKLE FRACTURE Right 11/06/2013   Procedure: OPEN REDUCTION INTERNAL FIXATION (ORIF) ANKLE FRACTURE;  Surgeon: Sanjuana Kava, MD;  Location: [facility];  Service: Orthopedics;  Laterality: Right;   TUBAL LIGATION      SOCIAL HISTORY: Social History   Socioeconomic History   Marital status: Widowed    Spouse name: Jori Moll   Number of children: 1   Years  of education: 12   Highest education level: 12th grade  Occupational History   Occupation: Retired  Tobacco Use   Smoking status: Never   Smokeless tobacco: Never  Vaping Use   Vaping Use: Never used  Substance and Sexual Activity   Alcohol use: No   Drug use: No   Sexual activity: Not Currently  Other Topics Concern   Not on file  Social History Narrative   Husband passed 2021   Son lives in [place] Determinants of Health   Financial Resource Strain: Low Risk  (10/28/2020)   Overall Financial Resource Strain (CARDIA)    Difficulty of Paying Living Expenses: Not hard at all  Food Insecurity: No Food Insecurity (10/28/2020)   Hunger Vital Sign    Worried About Running Out of Food in the Last Year: Never true    [place] in the Last Year: Never true  Transportation Needs: No Transportation Needs (10/28/2020)   PRAPARE - Hydrologist (Medical): No    Lack of Transportation (Non-Medical): No  Physical Activity: Insufficiently Active (10/28/2020)   Exercise Vital Sign    Days of Exercise per Week: 7 days    Minutes of Exercise per Session: 20 min  Stress: No Stress Concern Present (10/28/2020)   [place]    Feeling of Stress : Not at all  Social Connections: Moderately Integrated (10/28/2020)   Social Connection and Isolation Panel [NHANES]  Frequency of Communication with Friends and Family: More than three times a week    Frequency of Social Gatherings with Friends and Family: Twice a week    Attends Religious Services: More than 4 times per year    Active Member of [employer] or Organizations: Yes    Attends Archivist Meetings: More than 4 times per year    Marital Status: Widowed  Intimate Partner Violence: Not At Risk (10/28/2020)   Humiliation, Afraid, Rape, and Kick questionnaire    Fear of Current or Ex-Partner: No    Emotionally Abused: No     Physically Abused: No    Sexually Abused: No    FAMILY HISTORY: Family History  Problem Relation Age of Onset   Hypertension Mother    Diabetes Mother    Hypertension Father    Heart disease Father    Cancer Sister        brain, breast   Hypertension Sister    Diabetes Sister    Hypertension Sister    Heart disease Brother        congestive heart failure   Diabetes Maternal Grandmother    Diabetes Paternal Grandmother    Hypertension Son    Diabetes Son    Stomach cancer Neg Hx    Esophageal cancer Neg Hx    Colon cancer Neg Hx     ALLERGIES:  is allergic to ibuprofen.  MEDICATIONS:  Current Outpatient Medications  Medication Sig Dispense Refill   fluticasone (FLONASE) 50 MCG/ACT nasal spray Place 2 sprays into both nostrils daily. (Patient taking differently: Place 2 sprays into both nostrils daily as needed.) 16 g 5   Fluticasone Furoate (ARNUITY ELLIPTA) 100 MCG/ACT AEPB Inhale 1 Dose into the lungs daily. Take one inhalation orally daily. 30 each 6   Iron, Ferrous Sulfate, 325 (65 Fe) MG TABS Take 325 mg by mouth daily. 30 tablet 3   olmesartan-hydrochlorothiazide (BENICAR HCT) 40-25 MG tablet TAKE ONE (1) TABLET EACH DAY (Patient taking differently: TAKE ONE (1/2) TABLET EACH DAY) 90 tablet 1   Vitamin D, Ergocalciferol, (DRISDOL) 1.25 MG (50000 UNIT) CAPS capsule Take 1 capsule (50,000 Units total) by mouth every 7 (seven) days. 5 capsule 3   No current facility-administered medications for this visit.    REVIEW OF SYSTEMS:   Review of Systems  Constitutional:  Positive for unexpected weight change (-24 lbs). Negative for appetite change and fatigue.  Gastrointestinal:  Positive for blood in stool, diarrhea, nausea and rectal pain. Negative for abdominal pain.  Neurological:  Positive for numbness.  Psychiatric/Behavioral:  Positive for depression.   All other systems reviewed and are negative.    PHYSICAL EXAMINATION: ECOG PERFORMANCE STATUS: 1 -  Symptomatic but completely ambulatory  Vitals:   09/17/21 0815  BP: 100/69  Pulse: 95  Resp: 16  Temp: 97.8 F (36.6 C)  SpO2: 98%   Filed Weights   09/17/21 0815  Weight: 127 lb 12.8 oz (58 kg)   Physical Exam Vitals reviewed.  Constitutional:      Appearance: Normal appearance.  Cardiovascular:     Rate and Rhythm: Normal rate and regular rhythm.     Pulses: Normal pulses.     Heart sounds: Normal heart sounds.  Pulmonary:     Effort: Pulmonary effort is normal.     Breath sounds: Normal breath sounds.  Neurological:     General: No focal deficit present.     Mental Status: She is alert and oriented to person, place, and  time.  Psychiatric:        Mood and Affect: Mood normal.        Behavior: Behavior normal.      LABORATORY DATA:  I have reviewed the data as listed    Latest Ref Rng & Units 08/25/2021   12:30 PM 08/12/2021    4:04 PM 07/21/2021   11:34 AM  CBC  WBC 3.4 - 10.8 x10E3/uL 7.5  8.2  7.1   Hemoglobin 11.1 - 15.9 g/dL 12.6  11.5  11.8   Hematocrit 34.0 - 46.6 % 38.3  35.8  37.8   Platelets 150 - 450 x10E3/uL 382  379.0  374       Latest Ref Rng & Units 08/25/2021   12:30 PM 08/12/2021    4:04 PM 06/24/2021    2:40 PM  CMP  Glucose 70 - 99 mg/dL 102  115  129   BUN 8 - 27 mg/dL '12  11  15   ' Creatinine 0.57 - 1.00 mg/dL 0.83  0.71  0.77   Sodium 134 - 144 mmol/L 139  142  140   Potassium 3.5 - 5.2 mmol/L 4.0  3.7  4.2   Chloride 96 - 106 mmol/L 98  103  101   CO2 20 - 29 mmol/L '25  28  25   ' Calcium 8.7 - 10.3 mg/dL 9.6  8.7  9.3   Total Protein 6.0 - 8.5 g/dL 6.6  6.2    Total Bilirubin 0.0 - 1.2 mg/dL 0.2  0.3    Alkaline Phos 44 - 121 IU/L 108  66    AST 0 - 40 IU/L 20  16    ALT 0 - 32 IU/L 29  10      RADIOGRAPHIC STUDIES: I have personally reviewed the radiological images as listed and agreed with the findings in the report. MR PELVIS WO CONTRAST  Result Date: 09/08/2021 CLINICAL DATA:  Rectal cancer, diagnosed 5 weeks ago. EXAM: MRI  PELVIS WITHOUT CONTRAST TECHNIQUE: Multiplanar multisequence MR imaging of the pelvis was performed. No intravenous contrast was administered. Ultrasound gel was administered per rectum to optimize tumor evaluation. COMPARISON:  August 21, 2021. FINDINGS: TUMOR LOCATION Tumor distance from Anal Verge/Skin Surface:  9.5-10 cm Tumor distance to Internal Anal Sphincter: 5.8 cm TUMOR DESCRIPTION Circumferential Extent: Circumferential annular type lesion in the mid to high rectum. Tumor Length: 5.8 cm T - CATEGORY Extension through Muscularis Propria: Positive for extension beyond the muscularis tumor extending up to 6 mm (image 20/8) beyond the muscularis layer with nodular features compatible with T3 C disease. Shortest Distance of any tumor/node from Mesorectal Fascia: 6 mm Extramural Vascular Invasion/Tumor Thrombus: No Invasion of Anterior Peritoneal Reflection: No Involvement of Adjacent Organs or Pelvic Sidewall: No Levator Ani Involvement: No N - CATEGORY Mesorectal Lymph Nodes >=69m: N2 disease largest lymph node on the RIGHT at image 22/8 measuring 10 mm, contralateral lymph node in the LEFT mesorectum measuring 6 mm. Smaller lymph nodes track above the mesorectum along superior rectal veins. Extra-mesorectal Lymphadenopathy: Small superior rectal lymph node (image 7/8) 5 mm with rounded morphology Other:  None. IMPRESSION: 1. Circumferential annular type lesion in the mid to high rectum extending up to 6 mm beyond the muscularis layer compatible with T3 C disease. 2. N2 disease with lymph nodes in the mesorectum and along the superior rectal vein. Electronically Signed   By: GZetta BillsM.D.   On: 09/08/2021 13:54   CT CHEST ABDOMEN PELVIS W CONTRAST  Result  Date: 08/22/2021 CLINICAL DATA:  New diagnosis of rectal cancer.  Staging evaluation. * Tracking Code: BO * EXAM: CT CHEST, ABDOMEN, AND PELVIS WITH CONTRAST TECHNIQUE: Multidetector CT imaging of the chest, abdomen and pelvis was performed following  the standard protocol during bolus administration of intravenous contrast. RADIATION DOSE REDUCTION: This exam was performed according to the departmental dose-optimization program which includes automated exposure control, adjustment of the mA and/or kV according to patient size and/or use of iterative reconstruction technique. CONTRAST:  135m OMNIPAQUE IOHEXOL 300 MG/ML  SOLN COMPARISON:  None Available. FINDINGS: CT CHEST FINDINGS Cardiovascular: Normal heart size. No significant pericardial effusion/thickening. Great vessels are normal in course and caliber. No central pulmonary emboli. Mediastinum/Nodes: No discrete thyroid nodules. Small amount of oral contrast throughout mid to lower thoracic esophagus suggesting esophageal dysmotility and/or gastroesophageal reflux. No pathologically enlarged axillary, mediastinal or hilar lymph nodes. Lungs/Pleura: No pneumothorax. No pleural effusion. No acute consolidative airspace disease or lung masses. Several (at least 7) scattered small solid pulmonary nodules in both lungs, largest 0.4 cm in the anterior left lower lobe (series 5/image 86). Musculoskeletal: No aggressive appearing focal osseous lesions. Mild thoracic spondylosis. CT ABDOMEN PELVIS FINDINGS Hepatobiliary: Normal liver size. Dystrophic 0.7 cm right liver dome calcification. Three scattered subcentimeter hypodense liver lesions, too small to characterize, largest 0.5 cm in the segment 3 left liver (series 2/image 61). Normal gallbladder with no radiopaque cholelithiasis. No biliary ductal dilatation. Pancreas: Normal, with no mass or duct dilation. Spleen: Normal size. No mass. Adrenals/Urinary Tract: Normal adrenals. No hydronephrosis. Small simple bilateral parapelvic renal cysts, for which no imaging follow-up is recommended. No suspicious renal masses. Normal bladder. Stomach/Bowel: Moderate to large hiatal hernia. Otherwise normal stomach. Normal caliber small bowel with no small bowel wall  thickening. Normal appendix. Oral contrast transits to the colon. Irregular annular wall thickening in mid rectum involving an approximately 3.5 cm length segment (series 2/image 109). No colonic diverticulosis. No additional sites of colonic wall thickening. No significant pericolonic fat stranding. Vascular/Lymphatic: Atherosclerotic nonaneurysmal abdominal aorta. Patent portal, splenic, hepatic and renal veins. Several enlarged rounded perirectal lymph nodes, largest 1.3 cm on the right (series 2/image 107). No additional pathologically enlarged lymph nodes in the abdomen or pelvis. Reproductive: Grossly normal uterus.  No adnexal mass. Other: No pneumoperitoneum, ascites or focal fluid collection. Musculoskeletal: No aggressive appearing focal osseous lesions. Moderate lumbar spondylosis, most prominent at L5-S1. IMPRESSION: 1. Irregular annular wall thickening in the mid rectum involving an approximately 3.5 cm length segment, compatible with known primary rectal malignancy. 2. Several enlarged rounded perirectal lymph nodes, compatible with local nodal metastases. 3. No findings highly suspicious for distant metastatic disease. 4. Several (at least 7) scattered small solid pulmonary nodules in both lungs, largest 0.4 cm in the anterior left lower lobe, equivocal. Recommend attention on follow-up noncontrast chest CT in 3 months. 5. Three scattered subcentimeter hypodense liver lesions, too small to characterize. Recommend attention on follow-up CT abdomen with IV contrast or MRI abdomen without and with IV contrast in 3-6 months. 6. Moderate to large hiatal hernia. Small amount of oral contrast throughout the mid to lower thoracic esophagus suggesting esophageal dysmotility and/or gastroesophageal reflux. 7. Aortic Atherosclerosis (ICD10-I70.0). Electronically Signed   By: JIlona SorrelM.D.   On: 08/22/2021 20:58    ASSESSMENT:  Stage III (T3N2) mid to high rectal cancer: - Presentation with occasional  BRBPR for 3 months.  24 pound weight loss in the last 6 months, mostly from C. difficile infection. -  Colonoscopy (08/12/2021): Ulcerated nonobstructing large mass found in the rectum, approximately 10 cm from anal verge.  Near circumferential apple core lesion. - Pathology: Moderately differentiated invasive adenocarcinoma, - CT CAP (08/21/2021): Irregular wall thickening in the mid rectum involving 3.5 cm length segment, several enlarged rounded perirectal lymph nodes.  Several scattered small solid lung nodules bilaterally, largest 0.4 cm.  Scattered subcentimeter hypodense liver lesions too small to characterize. - MRI pelvis (09/07/2021): Circumferential annular type lesion in the mid to high rectum extending up to 6 mm beyond the muscularis layer compatible with T3c disease.  N2 disease with lymph nodes in the mesorectum and along the superior rectal vein. - CEA (08/12/2021): 3.4. - She was already evaluated by Dr. Marcello Moores - She has left foot numbness since 2015 from back pain.  She does report occasional rectal pain when she has frequent bowel movements.   Social/family history: - Lives by herself at home.  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  She was self employed prior to that.  Non-smoker. - Sister had metastatic breast cancer.  Paternal aunt had cancer.   PLAN:  Stage III (T3N2) rectal cancer: - We reviewed her diagnosis and scans in detail. - We discussed the role of total neoadjuvant therapy with 4 months of chemotherapy followed by chemoradiation followed by surgery. - She is reluctant to consider chemotherapy. - We talked about chemoradiation therapy with Xeloda 1500 mg twice daily Monday through Friday throughout the course of radiation. - We discussed side effects of Xeloda in detail.  Literature was given. - Recommend MRI of the abdomen with and without contrast to further evaluate liver lesions. - Refer to radiation oncology. - We will  request MMR/MSI testing.   All questions were answered. The patient knows to call the clinic with any problems, questions or concerns.   Derek Jack, MD, 09/17/21 8:19 AM  [place] [phone]   I, Thana Ates, am acting as a scribe for Dr. Derek Jack.  I, Derek Jack MD, have reviewed the above documentation for accuracy and completeness, and I agree with the above.

## 2021-09-17 NOTE — Progress Notes (Signed)
I met with the patient and her son today during and following initial visit with Dr. Delton Coombes. Written information provided on Xeloda as discussed by Dr. Delton Coombes.

## 2021-09-17 NOTE — Progress Notes (Signed)
MMR/MSI testing requested with Tomi Bamberger at [facility] per Dr. Delton Coombes.

## 2021-09-17 NOTE — Telephone Encounter (Signed)
Oral Oncology Pharmacist Encounter  Received new prescription for Xeloda (capecitabine) for the neoadjuvant treatment of rectal cancer in conjunction with radiation, planned duration until the end of radiation.  CMP from 08/25/21 assessed, no relevant lab abnormalities. Prescription dose and frequency assessed.   Current medication list in Epic reviewed, no DDIs with capecitabine identified.  Evaluated chart and no patient barriers to medication adherence identified.   Prescription has been e-scribed to the [facility] for benefits analysis and approval.  Oral Oncology Clinic will continue to follow for insurance authorization, copayment issues, initial counseling and start date.   Darl Pikes, PharmD, BCPS, BCOP, CPP Hematology/Oncology Clinical Pharmacist Practitioner [place]/[facility] [place] [facility] [phone]  09/17/2021 1:46 PM

## 2021-09-17 NOTE — Telephone Encounter (Addendum)
Oral Oncology Patient Advocate Encounter   Received notification that prior authorization for Capecitabine is required.   PA submitted on 09/17/2021 via phone to [facility]  Status is approved. Case # [id number]  Patient's copay is $1,126.67    Lady Deutscher, CPhT-Adv Pharmacy Patient Advocate Specialist [place] Patient Advocate Team Direct Number: [phone]  Fax: [phone]

## 2021-09-17 NOTE — Patient Instructions (Addendum)
[place] at [facility] Discharge Instructions  You were seen and examined today by Dr. Delton Coombes. Dr. Delton Coombes is a medical oncologist, meaning that he specializes in the treatment of cancer diagnoses. Dr. Delton Coombes discussed your past medical history, family history of cancers, and the events that led to you being here today.  You were referred to Dr. Delton Coombes due to a new diagnosis of rectal cancer.  Dr. Delton Coombes has recommended 4 months (8 treatments) of chemotherapy followed by approximately 6 weeks of chemotherapy pills and radiation prior to surgery. This will shrink the tumor prior to surgery and decrease the risk of recurrence. The goal of treatment would be to cure the cancer.  Chemotherapy would be given in the clinic once every 2 weeks for a total of 8 treatments through a Port-A-Cath. Following that, chemotherapy pills are Xeloda. These are taken each day that you receive radiation. You will need to take Compazine (anti nausea medication) abut 30 minutes prior to each dose of Xeloda. While you are undergoing chemotherapy pills and radiation, you will have weekly check-ins with Dr. Delton Coombes.  The most common side effects of both chemotherapy and chemotherapy pills are fatigue, decreased blood counts (anemia/increased risk of infection), diarrhea, and nausea.  Dr. Delton Coombes has made a referral to [facility] Radiation Oncology. You will meet with Dr. Lynnette Caffey there to discuss specifics regarding radiation.  We will call you to arrange follow-up after a start date of your radiation has been determined.  Thank you for choosing [place] at [facility] to provide your oncology and hematology care.  To afford each patient quality time with our provider, please arrive at least 15 minutes before your scheduled appointment time.   If you have a lab appointment with the [facility] please come in thru the Main Entrance and check in  at the main information desk.  You need to re-schedule your appointment should you arrive 10 or more minutes late.  We strive to give you quality time with our providers, and arriving late affects you and other patients whose appointments are after yours.  Also, if you no show three or more times for appointments you may be dismissed from the clinic at the providers discretion.     Again, thank you for choosing [facility].  Our hope is that these requests will decrease the amount of time that you wait before being seen by our physicians.       _____________________________________________________________  Should you have questions after your visit to [facility], please contact our office at [phone] and follow the prompts.  Our office hours are 8:00 a.m. and 4:30 p.m. Monday - Friday.  Please note that voicemails left after 4:00 p.m. may not be returned until the following business day.  We are closed weekends and major holidays.  You do have access to a nurse 24-7, just call the main number to the clinic [phone] and do not press any options, hold on the line and a nurse will answer the phone.    For prescription refill requests, have your pharmacy contact our office and allow 72 hours.

## 2021-09-18 ENCOUNTER — Other Ambulatory Visit: Payer: Self-pay | Admitting: *Deleted

## 2021-09-18 ENCOUNTER — Other Ambulatory Visit (HOSPITAL_COMMUNITY): Payer: Self-pay

## 2021-09-18 MED ORDER — PROCHLORPERAZINE MALEATE 10 MG PO TABS: 10.0000 mg | ORAL_TABLET | Freq: Four times a day (QID) | ORAL | 4 refills | Status: AC | PRN

## 2021-09-22 ENCOUNTER — Other Ambulatory Visit (HOSPITAL_COMMUNITY): Payer: Self-pay

## 2021-09-22 NOTE — Telephone Encounter (Signed)
Oral Chemotherapy Pharmacist Encounter  [place] will deliver medication to Regina Knox on 09/23/21. She knows that she is not to start until her first day of radiation treatment.   Patient Education I spoke with patient for overview of new oral chemotherapy medication: Xeloda (capecitabine) for the neoadjuvant treatment of rectal cancer in conjunction with radiation, planned duration until the end of radiation.   Pt is doing well. Counseled patient on administration, dosing, side effects, monitoring, drug-food interactions, safe handling, storage, and disposal. Patient will take tablets (1,500 mg total) by mouth 2 (two) times daily after a meal. Take Monday-Friday. Take only on days of radiation.  Side effects include but not limited to: diarrhea, hand-foot syndrome, mouth sores, edema, decreased wbc, fatigue, N/V Diarrhea: Patient plans on picking up loperamide to have on hand. She knows to call the office if she is using the loperamide but continuing to have 4 or more loose stools Hand-foot syndrome: Recommend the use of [place] Smooth Extra Care 20 she knows to report any skin changes or pain on her hand and feet Mouth sores: Instructed patient to call for Magic Mouthwash if needed.     Reviewed with patient importance of keeping a medication schedule and plan for any missed doses.  After discussion with patient no patient barriers to medication adherence identified.   Ms. Kaeser voiced understanding and appreciation. All questions answered. Medication handout provided.  Provided patient with [facility] [place] [facility] phone number. Patient knows to call the office with questions or concerns. Oral Chemotherapy Navigation Clinic will continue to follow.  Darl Pikes, PharmD, BCPS, BCOP, CPP Hematology/Oncology Clinical Pharmacist Practitioner [place]/[facility] [place] [facility] [phone]  09/22/2021 10:55 AM

## 2021-09-22 NOTE — Telephone Encounter (Signed)
Due to high cost, patient will using [facility] discounted pricing.

## 2021-09-23 DIAGNOSIS — R000 Tachycardia, unspecified: Secondary | ICD-10-CM | POA: Diagnosis not present

## 2021-09-23 DIAGNOSIS — E876 Hypokalemia: Secondary | ICD-10-CM | POA: Diagnosis not present

## 2021-09-23 DIAGNOSIS — K219 Gastro-esophageal reflux disease without esophagitis: Secondary | ICD-10-CM | POA: Diagnosis not present

## 2021-09-23 DIAGNOSIS — I959 Hypotension, unspecified: Secondary | ICD-10-CM | POA: Diagnosis not present

## 2021-09-23 DIAGNOSIS — D649 Anemia, unspecified: Secondary | ICD-10-CM | POA: Diagnosis not present

## 2021-09-23 DIAGNOSIS — D500 Iron deficiency anemia secondary to blood loss (chronic): Secondary | ICD-10-CM | POA: Diagnosis not present

## 2021-09-23 DIAGNOSIS — Z66 Do not resuscitate: Secondary | ICD-10-CM | POA: Diagnosis not present

## 2021-09-23 DIAGNOSIS — Z888 Allergy status to other drugs, medicaments and biological substances status: Secondary | ICD-10-CM | POA: Diagnosis not present

## 2021-09-23 DIAGNOSIS — Z743 Need for continuous supervision: Secondary | ICD-10-CM | POA: Diagnosis not present

## 2021-09-23 DIAGNOSIS — Z79899 Other long term (current) drug therapy: Secondary | ICD-10-CM | POA: Diagnosis not present

## 2021-09-23 DIAGNOSIS — I10 Essential (primary) hypertension: Secondary | ICD-10-CM | POA: Diagnosis not present

## 2021-09-23 DIAGNOSIS — R55 Syncope and collapse: Secondary | ICD-10-CM | POA: Insufficient documentation

## 2021-09-23 DIAGNOSIS — K922 Gastrointestinal hemorrhage, unspecified: Secondary | ICD-10-CM | POA: Diagnosis not present

## 2021-09-23 DIAGNOSIS — Z7951 Long term (current) use of inhaled steroids: Secondary | ICD-10-CM | POA: Diagnosis not present

## 2021-09-23 DIAGNOSIS — C19 Malignant neoplasm of rectosigmoid junction: Secondary | ICD-10-CM | POA: Diagnosis not present

## 2021-09-23 DIAGNOSIS — I081 Rheumatic disorders of both mitral and tricuspid valves: Secondary | ICD-10-CM | POA: Diagnosis not present

## 2021-09-23 DIAGNOSIS — C20 Malignant neoplasm of rectum: Secondary | ICD-10-CM | POA: Diagnosis not present

## 2021-09-24 DIAGNOSIS — R55 Syncope and collapse: Secondary | ICD-10-CM | POA: Diagnosis not present

## 2021-09-24 DIAGNOSIS — C20 Malignant neoplasm of rectum: Secondary | ICD-10-CM | POA: Diagnosis not present

## 2021-09-24 DIAGNOSIS — D509 Iron deficiency anemia, unspecified: Secondary | ICD-10-CM | POA: Diagnosis not present

## 2021-09-24 DIAGNOSIS — K219 Gastro-esophageal reflux disease without esophagitis: Secondary | ICD-10-CM | POA: Diagnosis not present

## 2021-09-24 DIAGNOSIS — K922 Gastrointestinal hemorrhage, unspecified: Secondary | ICD-10-CM | POA: Diagnosis not present

## 2021-09-25 DIAGNOSIS — C20 Malignant neoplasm of rectum: Secondary | ICD-10-CM | POA: Diagnosis not present

## 2021-09-25 DIAGNOSIS — D649 Anemia, unspecified: Secondary | ICD-10-CM | POA: Diagnosis not present

## 2021-09-25 DIAGNOSIS — Z923 Personal history of irradiation: Secondary | ICD-10-CM | POA: Diagnosis not present

## 2021-09-25 DIAGNOSIS — R000 Tachycardia, unspecified: Secondary | ICD-10-CM | POA: Diagnosis not present

## 2021-09-25 DIAGNOSIS — Z803 Family history of malignant neoplasm of breast: Secondary | ICD-10-CM | POA: Diagnosis not present

## 2021-09-29 ENCOUNTER — Ambulatory Visit
Admission: RE | Admit: 2021-09-29 | Discharge: 2021-09-29 | Disposition: A | Discharge status: Home / Self Care | Payer: Medicare HMO | Source: Ambulatory Visit | Attending: Hematology | Admitting: Hematology

## 2021-09-29 DIAGNOSIS — C20 Malignant neoplasm of rectum: Secondary | ICD-10-CM

## 2021-09-29 DIAGNOSIS — K769 Liver disease, unspecified: Secondary | ICD-10-CM | POA: Diagnosis not present

## 2021-09-29 MED ORDER — GADOBENATE DIMEGLUMINE 529 MG/ML IV SOLN
11.0000 mL | Freq: Once | INTRAVENOUS | Status: AC | PRN
  Administered 2021-09-29: 11 mL via INTRAVENOUS

## 2021-09-30 DIAGNOSIS — R000 Tachycardia, unspecified: Secondary | ICD-10-CM | POA: Diagnosis not present

## 2021-09-30 DIAGNOSIS — C20 Malignant neoplasm of rectum: Secondary | ICD-10-CM | POA: Diagnosis not present

## 2021-09-30 DIAGNOSIS — Z923 Personal history of irradiation: Secondary | ICD-10-CM | POA: Diagnosis not present

## 2021-09-30 DIAGNOSIS — D649 Anemia, unspecified: Secondary | ICD-10-CM | POA: Diagnosis not present

## 2021-09-30 DIAGNOSIS — Z803 Family history of malignant neoplasm of breast: Secondary | ICD-10-CM | POA: Diagnosis not present

## 2021-10-02 DIAGNOSIS — R000 Tachycardia, unspecified: Secondary | ICD-10-CM | POA: Diagnosis not present

## 2021-10-02 DIAGNOSIS — D649 Anemia, unspecified: Secondary | ICD-10-CM | POA: Diagnosis not present

## 2021-10-02 DIAGNOSIS — Z923 Personal history of irradiation: Secondary | ICD-10-CM | POA: Diagnosis not present

## 2021-10-02 DIAGNOSIS — Z803 Family history of malignant neoplasm of breast: Secondary | ICD-10-CM | POA: Diagnosis not present

## 2021-10-02 DIAGNOSIS — C20 Malignant neoplasm of rectum: Secondary | ICD-10-CM | POA: Diagnosis not present

## 2021-10-03 DIAGNOSIS — R000 Tachycardia, unspecified: Secondary | ICD-10-CM | POA: Diagnosis not present

## 2021-10-03 DIAGNOSIS — Z803 Family history of malignant neoplasm of breast: Secondary | ICD-10-CM | POA: Diagnosis not present

## 2021-10-03 DIAGNOSIS — Z923 Personal history of irradiation: Secondary | ICD-10-CM | POA: Diagnosis not present

## 2021-10-03 DIAGNOSIS — C20 Malignant neoplasm of rectum: Secondary | ICD-10-CM | POA: Diagnosis not present

## 2021-10-03 DIAGNOSIS — D649 Anemia, unspecified: Secondary | ICD-10-CM | POA: Diagnosis not present

## 2021-10-06 DIAGNOSIS — Z803 Family history of malignant neoplasm of breast: Secondary | ICD-10-CM | POA: Diagnosis not present

## 2021-10-06 DIAGNOSIS — Z923 Personal history of irradiation: Secondary | ICD-10-CM | POA: Diagnosis not present

## 2021-10-06 DIAGNOSIS — C20 Malignant neoplasm of rectum: Secondary | ICD-10-CM | POA: Diagnosis not present

## 2021-10-06 DIAGNOSIS — D649 Anemia, unspecified: Secondary | ICD-10-CM | POA: Diagnosis not present

## 2021-10-06 DIAGNOSIS — R000 Tachycardia, unspecified: Secondary | ICD-10-CM | POA: Diagnosis not present

## 2021-10-07 DIAGNOSIS — Z923 Personal history of irradiation: Secondary | ICD-10-CM | POA: Diagnosis not present

## 2021-10-07 DIAGNOSIS — Z803 Family history of malignant neoplasm of breast: Secondary | ICD-10-CM | POA: Diagnosis not present

## 2021-10-07 DIAGNOSIS — D649 Anemia, unspecified: Secondary | ICD-10-CM | POA: Diagnosis not present

## 2021-10-07 DIAGNOSIS — R000 Tachycardia, unspecified: Secondary | ICD-10-CM | POA: Diagnosis not present

## 2021-10-07 DIAGNOSIS — C20 Malignant neoplasm of rectum: Secondary | ICD-10-CM | POA: Diagnosis not present

## 2021-10-08 DIAGNOSIS — C20 Malignant neoplasm of rectum: Secondary | ICD-10-CM | POA: Diagnosis not present

## 2021-10-08 DIAGNOSIS — Z803 Family history of malignant neoplasm of breast: Secondary | ICD-10-CM | POA: Diagnosis not present

## 2021-10-08 DIAGNOSIS — D649 Anemia, unspecified: Secondary | ICD-10-CM | POA: Diagnosis not present

## 2021-10-08 DIAGNOSIS — R000 Tachycardia, unspecified: Secondary | ICD-10-CM | POA: Diagnosis not present

## 2021-10-08 DIAGNOSIS — Z923 Personal history of irradiation: Secondary | ICD-10-CM | POA: Diagnosis not present

## 2021-10-09 ENCOUNTER — Inpatient Hospital Stay (HOSPITAL_BASED_OUTPATIENT_CLINIC_OR_DEPARTMENT_OTHER): Payer: Medicare HMO | Admitting: Hematology

## 2021-10-09 ENCOUNTER — Inpatient Hospital Stay: Payer: Medicare HMO

## 2021-10-09 VITALS — BP 103/70 | HR 95 | Temp 98.7°F | Resp 16 | Ht 63.0 in | Wt 124.5 lb

## 2021-10-09 DIAGNOSIS — C20 Malignant neoplasm of rectum: Secondary | ICD-10-CM

## 2021-10-09 DIAGNOSIS — D649 Anemia, unspecified: Secondary | ICD-10-CM

## 2021-10-09 DIAGNOSIS — R000 Tachycardia, unspecified: Secondary | ICD-10-CM | POA: Diagnosis not present

## 2021-10-09 DIAGNOSIS — Z803 Family history of malignant neoplasm of breast: Secondary | ICD-10-CM | POA: Diagnosis not present

## 2021-10-09 DIAGNOSIS — Z923 Personal history of irradiation: Secondary | ICD-10-CM | POA: Diagnosis not present

## 2021-10-09 DIAGNOSIS — Z808 Family history of malignant neoplasm of other organs or systems: Secondary | ICD-10-CM | POA: Diagnosis not present

## 2021-10-09 LAB — COMPREHENSIVE METABOLIC PANEL
ALT: 13 U/L (ref 0–44)
AST: 17 U/L (ref 15–41)
Albumin: 3.4 g/dL — ABNORMAL LOW (ref 3.5–5.0)
Alkaline Phosphatase: 58 U/L (ref 38–126)
Anion gap: 6 (ref 5–15)
BUN: 16 mg/dL (ref 8–23)
CO2: 29 mmol/L (ref 22–32)
Calcium: 9.2 mg/dL (ref 8.9–10.3)
Chloride: 99 mmol/L (ref 98–111)
Creatinine, Ser: 0.8 mg/dL (ref 0.44–1.00)
GFR, Estimated: >60 mL/min (ref >60)
Glucose, Bld: 124 mg/dL — ABNORMAL HIGH (ref 70–99)
Potassium: 4.1 mmol/L (ref 3.5–5.1)
Sodium: 134 mmol/L — ABNORMAL LOW (ref 135–145)
Total Bilirubin: 0.9 mg/dL (ref 0.3–1.2)
Total Protein: 6.7 g/dL (ref 6.5–8.1)

## 2021-10-09 LAB — CBC WITH DIFFERENTIAL/PLATELET
Abs Immature Granulocytes: 0.07 10*3/uL (ref 0.00–0.07)
Basophils Absolute: 0 10*3/uL (ref 0.0–0.1)
Basophils Relative: 0 %
Eosinophils Absolute: 0.1 10*3/uL (ref 0.0–0.5)
Eosinophils Relative: 1 %
HCT: 32.8 % — ABNORMAL LOW (ref 36.0–46.0)
Hemoglobin: 10.7 g/dL — ABNORMAL LOW (ref 12.0–15.0)
Immature Granulocytes: 1 %
Lymphocytes Relative: 7 %
Lymphs Abs: 0.5 10*3/uL — ABNORMAL LOW (ref 0.7–4.0)
MCH: 30.1 pg (ref 26.0–34.0)
MCHC: 32.6 g/dL (ref 30.0–36.0)
MCV: 92.1 fL (ref 80.0–100.0)
Monocytes Absolute: 0.4 10*3/uL (ref 0.1–1.0)
Monocytes Relative: 5 %
Neutro Abs: 6 10*3/uL (ref 1.7–7.7)
Neutrophils Relative %: 86 %
Platelets: 374 10*3/uL (ref 150–400)
RBC: 3.56 MIL/uL — ABNORMAL LOW (ref 3.87–5.11)
RDW: 15.5 % (ref 11.5–15.5)
WBC: 7.1 10*3/uL (ref 4.0–10.5)
nRBC: 0 % (ref 0.0–0.2)

## 2021-10-09 LAB — MAGNESIUM: Magnesium: 2.1 mg/dL (ref 1.7–2.4)

## 2021-10-09 NOTE — Progress Notes (Signed)
[place], [place] [address]   CLINIC:  Medical Oncology/Hematology  PCP:  Chevis Pretty, [place] [address] [phone]   REASON FOR VISIT:  Follow-up for stage III rectal cancer  PRIOR THERAPY: None  NGS Results: MMR-preserved  CURRENT THERAPY: Xeloda and radiation  BRIEF ONCOLOGIC HISTORY:  Oncology History   No history exists.    CANCER STAGING: Cancer Staging  Rectal cancer [facility]) Staging form: Colon and Rectum, AJCC 8th Edition - Clinical stage from 09/16/2021: cT3, cN2, cM0 - Unsigned    INTERVAL HISTORY:  Ms. Cumpian 76 y.o. female seen for follow-up of rectal cancer.  She started radiation therapy with Xeloda on 10/02/2021.  Reports appetite 100% and energy levels 80%.  Low back pain is stable and rated as 6 out of 10.  She has taking Compazine for the first 2 days after start of Xeloda.  After that she is not requiring antinausea medication.  Denies any diarrhea.  No erythema or skin peeling of palms and soles.    REVIEW OF SYSTEMS:  Review of Systems  Psychiatric/Behavioral:  Positive for sleep disturbance. The patient is nervous/anxious.   All other systems reviewed and are negative.    PAST MEDICAL/SURGICAL HISTORY:  Past Medical History:  Diagnosis Date   Allergy    Anemia    Asthma    Elevated cholesterol    GERD (gastroesophageal reflux disease)    Hypertension    Past Surgical History:  Procedure Laterality Date   BREAST SURGERY  80's   right breast   ORIF ANKLE FRACTURE Right 11/06/2013   Procedure: OPEN REDUCTION INTERNAL FIXATION (ORIF) ANKLE FRACTURE;  Surgeon: Sanjuana Kava, MD;  Location: [facility];  Service: Orthopedics;  Laterality: Right;   TUBAL LIGATION       SOCIAL HISTORY:  Social History   Socioeconomic History   Marital status: Widowed    Spouse name: Jori Moll   Number of children: 1   Years of education: 12   Highest education level: 12th grade   Occupational History   Occupation: Retired  Tobacco Use   Smoking status: Never   Smokeless tobacco: Never  Vaping Use   Vaping Use: Never used  Substance and Sexual Activity   Alcohol use: No   Drug use: No   Sexual activity: Not Currently  Other Topics Concern   Not on file  Social History Narrative   Husband passed 2021   Son lives in [place] Determinants of Health   Financial Resource Strain: Low Risk  (10/28/2020)   Overall Financial Resource Strain (CARDIA)    Difficulty of Paying Living Expenses: Not hard at all  Food Insecurity: No Food Insecurity (10/28/2020)   Hunger Vital Sign    Worried About Running Out of Food in the Last Year: Never true    [place] in the Last Year: Never true  Transportation Needs: No Transportation Needs (10/28/2020)   PRAPARE - Hydrologist (Medical): No    Lack of Transportation (Non-Medical): No  Physical Activity: Insufficiently Active (10/28/2020)   Exercise Vital Sign    Days of Exercise per Week: 7 days    Minutes of Exercise per Session: 20 min  Stress: No Stress Concern Present (10/28/2020)   [place]    Feeling of Stress : Not at all  Social Connections: Moderately Integrated (10/28/2020)   Social  Connection and Isolation Panel [NHANES]    Frequency of Communication with Friends and Family: More than three times a week    Frequency of Social Gatherings with Friends and Family: Twice a week    Attends Religious Services: More than 4 times per year    Active Member of [employer] or Organizations: Yes    Attends Archivist Meetings: More than 4 times per year    Marital Status: Widowed  Intimate Partner Violence: Not At Risk (10/28/2020)   Humiliation, Afraid, Rape, and Kick questionnaire    Fear of Current or Ex-Partner: No    Emotionally Abused: No    Physically Abused: No    Sexually Abused: No    FAMILY  HISTORY:  Family History  Problem Relation Age of Onset   Hypertension Mother    Diabetes Mother    Hypertension Father    Heart disease Father    Cancer Sister        brain, breast   Hypertension Sister    Diabetes Sister    Hypertension Sister    Heart disease Brother        congestive heart failure   Diabetes Maternal Grandmother    Diabetes Paternal Grandmother    Hypertension Son    Diabetes Son    Stomach cancer Neg Hx    Esophageal cancer Neg Hx    Colon cancer Neg Hx     CURRENT MEDICATIONS:  Outpatient Encounter Medications as of 10/09/2021  Medication Sig Note   capecitabine (XELODA) 500 MG tablet Take 3 tablets (1,500 mg total) by mouth 2 (two) times daily after a meal. Take Monday-Friday. Take only on days of radiation.    fluticasone (FLONASE) 50 MCG/ACT nasal spray Place 2 sprays into both nostrils daily. (Patient taking differently: Place 2 sprays into both nostrils daily as needed.) 07/29/2021: As needed   Fluticasone Furoate (ARNUITY ELLIPTA) 100 MCG/ACT AEPB Inhale 1 Dose into the lungs daily. Take one inhalation orally daily.    Iron, Ferrous Sulfate, 325 (65 Fe) MG TABS Take 325 mg by mouth daily.    olmesartan-hydrochlorothiazide (BENICAR HCT) 40-25 MG tablet TAKE ONE (1) TABLET EACH DAY (Patient taking differently: TAKE ONE (1/2) TABLET EACH DAY)    Vitamin D, Ergocalciferol, (DRISDOL) 1.25 MG (50000 UNIT) CAPS capsule Take 1 capsule (50,000 Units total) by mouth every 7 (seven) days.    prochlorperazine (COMPAZINE) 10 MG tablet Take 1 tablet (10 mg total) by mouth every 6 (six) hours as needed for nausea or vomiting. (Patient not taking: Reported on 10/09/2021)    No facility-administered encounter medications on file as of 10/09/2021.    ALLERGIES:  Allergies  Allergen Reactions   Ibuprofen Rash     PHYSICAL EXAM:  ECOG Performance status: 1  Vitals:   10/09/21 1105  BP: 103/70  Pulse: 95  Resp: 16  Temp: 98.7 F (37.1 C)  SpO2: 99%   Filed  Weights   10/09/21 1105  Weight: 124 lb 8.3 oz (56.5 kg)   Physical Exam Vitals reviewed.  Constitutional:      Appearance: Normal appearance.  Cardiovascular:     Rate and Rhythm: Normal rate and regular rhythm.     Heart sounds: Normal heart sounds.  Pulmonary:     Breath sounds: Normal breath sounds.  Abdominal:     Palpations: Abdomen is soft. There is no mass.  Neurological:     General: No focal deficit present.     Mental Status: She is alert and  oriented to person, place, and time.  Psychiatric:        Behavior: Behavior normal.      LABORATORY DATA:  I have reviewed the labs as listed.  CBC    Component Value Date/Time   WBC 7.1 10/09/2021 1017   RBC 3.56 (L) 10/09/2021 1017   HGB 10.7 (L) 10/09/2021 1017   HGB 12.6 08/25/2021 1230   HCT 32.8 (L) 10/09/2021 1017   HCT 38.3 08/25/2021 1230   PLT 374 10/09/2021 1017   PLT 382 08/25/2021 1230   MCV 92.1 10/09/2021 1017   MCV 86 08/25/2021 1230   MCH 30.1 10/09/2021 1017   MCHC 32.6 10/09/2021 1017   RDW 15.5 10/09/2021 1017   RDW 19.1 (H) 08/25/2021 1230   LYMPHSABS 0.5 (L) 10/09/2021 1017   LYMPHSABS 1.4 08/25/2021 1230   MONOABS 0.4 10/09/2021 1017   EOSABS 0.1 10/09/2021 1017   EOSABS 0.3 08/25/2021 1230   BASOSABS 0.0 10/09/2021 1017   BASOSABS 0.1 08/25/2021 1230      Latest Ref Rng & Units 10/09/2021   10:17 AM 08/25/2021   12:30 PM 08/12/2021    4:04 PM  CMP  Glucose 70 - 99 mg/dL 124  102  115   BUN 8 - 23 mg/dL '16  12  11   ' Creatinine 0.44 - 1.00 mg/dL 0.80  0.83  0.71   Sodium 135 - 145 mmol/L 134  139  142   Potassium 3.5 - 5.1 mmol/L 4.1  4.0  3.7   Chloride 98 - 111 mmol/L 99  98  103   CO2 22 - 32 mmol/L '29  25  28   ' Calcium 8.9 - 10.3 mg/dL 9.2  9.6  8.7   Total Protein 6.5 - 8.1 g/dL 6.7  6.6  6.2   Total Bilirubin 0.3 - 1.2 mg/dL 0.9  0.2  0.3   Alkaline Phos 38 - 126 U/L 58  108  66   AST 15 - 41 U/L '17  20  16   ' ALT 0 - 44 U/L '13  29  10     ' DIAGNOSTIC IMAGING:  I have  independently reviewed the scans and discussed with the patient.  ASSESSMENT: Stage III (T3N2) mid to high rectal cancer: - Presentation with occasional BRBPR for 3 months.  24 pound weight loss in the last 6 months, mostly from C. difficile infection. - Colonoscopy (08/12/2021): Ulcerated nonobstructing large mass found in the rectum, approximately 10 cm from anal verge.  Near circumferential apple core lesion. - Pathology: Moderately differentiated invasive adenocarcinoma, - CT CAP (08/21/2021): Irregular wall thickening in the mid rectum involving 3.5 cm length segment, several enlarged rounded perirectal lymph nodes.  Several scattered small solid lung nodules bilaterally, largest 0.4 cm.  Scattered subcentimeter hypodense liver lesions too small to characterize. - MRI pelvis (09/07/2021): Circumferential annular type lesion in the mid to high rectum extending up to 6 mm beyond the muscularis layer compatible with T3c disease.  N2 disease with lymph nodes in the mesorectum and along the superior rectal vein. - CEA (08/12/2021): 3.4. - She was already evaluated by Dr. Marcello Moores - She has left foot numbness since 2015 from back pain.  She does report occasional rectal pain when she has frequent bowel movements.       -We have discussed total neoadjuvant chemotherapy but she refused.       -MRI of the liver (09/29/2021): No suspicious hepatic lesion.       - Xeloda (1500 mg  twice daily) with XRT started on 10/02/2021    Social/family history: - Lives by herself at home.  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  She was self employed prior to that.  Non-smoker. - Sister had metastatic breast cancer.  Paternal aunt had cancer.   PLAN:  1.  Stage III (T3N2) rectal cancer, MMR preserved: - She started XRT on 10/02/2021.  She is taking Xeloda 1500 mg twice daily.  She has not been requiring any Compazine for the last few days. - MRI of the liver (09/29/2021): No suspicious  hepatic lesion. - Reviewed labs today which shows normal LFTs and electrolytes.  CBC was grossly normal.  No clinical signs or symptoms of hand-foot skin reaction. - Continue Xeloda Monday through Friday.  RTC 1 week for follow-up with repeat labs and exam.  2.  Normocytic anemia: - Hemoglobin is 10.7.  She is taking iron tablet daily. - We will check ferritin, iron panel, X48 and folic acid with next week labs.      Orders placed this encounter:  Orders Placed This Encounter  Procedures   CBC with Differential   Comprehensive metabolic panel   Magnesium   Iron and TIBC ([facility] ONLY)   Ferritin   Vitamin B12   Folate      Derek Jack, MD [place] [phone]

## 2021-10-09 NOTE — Patient Instructions (Addendum)
[place] at [facility] Discharge Instructions   You were seen and examined today by Dr. Delton Coombes.  He reviewed the results of your lab work which are normal/stable.   Continue Xeloda as prescribed.   We will see you back in 1 week. We will repeat your blood work at that time.    Thank you for choosing [place] at [facility] to provide your oncology and hematology care.  To afford each patient quality time with our provider, please arrive at least 15 minutes before your scheduled appointment time.   If you have a lab appointment with the [place] please come in thru the Main Entrance and check in at the main information desk.  You need to re-schedule your appointment should you arrive 10 or more minutes late.  We strive to give you quality time with our providers, and arriving late affects you and other patients whose appointments are after yours.  Also, if you no show three or more times for appointments you may be dismissed from the clinic at the providers discretion.     Again, thank you for choosing [facility].  Our hope is that these requests will decrease the amount of time that you wait before being seen by our physicians.       _____________________________________________________________  Should you have questions after your visit to [facility], please contact our office at [phone] and follow the prompts.  Our office hours are 8:00 a.m. and 4:30 p.m. Monday - Friday.  Please note that voicemails left after 4:00 p.m. may not be returned until the following business day.  We are closed weekends and major holidays.  You do have access to a nurse 24-7, just call the main number to the clinic [phone] and do not press any options, hold on the line and a nurse will answer the phone.    For prescription refill requests, have your pharmacy contact our office and allow 72 hours.    Due to Covid,  you will need to wear a mask upon entering the hospital. If you do not have a mask, a mask will be given to you at the Main Entrance upon arrival. For doctor visits, patients may have 1 support person age 49 or older with them. For treatment visits, patients can not have anyone with them due to social distancing guidelines and our immunocompromised population.

## 2021-10-09 NOTE — Progress Notes (Signed)
Patient is taking Xeloda as prescribed.  She has not missed any doses and reports no side effects at this time.

## 2021-10-10 DIAGNOSIS — R000 Tachycardia, unspecified: Secondary | ICD-10-CM | POA: Diagnosis not present

## 2021-10-10 DIAGNOSIS — Z923 Personal history of irradiation: Secondary | ICD-10-CM | POA: Diagnosis not present

## 2021-10-10 DIAGNOSIS — Z803 Family history of malignant neoplasm of breast: Secondary | ICD-10-CM | POA: Diagnosis not present

## 2021-10-10 DIAGNOSIS — D649 Anemia, unspecified: Secondary | ICD-10-CM | POA: Diagnosis not present

## 2021-10-10 DIAGNOSIS — C20 Malignant neoplasm of rectum: Secondary | ICD-10-CM | POA: Diagnosis not present

## 2021-10-10 LAB — CEA: CEA: 4.3 ng/mL (ref 0.0–4.7)

## 2021-10-13 ENCOUNTER — Other Ambulatory Visit (HOSPITAL_COMMUNITY): Payer: Self-pay

## 2021-10-13 DIAGNOSIS — R000 Tachycardia, unspecified: Secondary | ICD-10-CM | POA: Diagnosis not present

## 2021-10-13 DIAGNOSIS — C20 Malignant neoplasm of rectum: Secondary | ICD-10-CM | POA: Diagnosis not present

## 2021-10-13 DIAGNOSIS — D649 Anemia, unspecified: Secondary | ICD-10-CM | POA: Diagnosis not present

## 2021-10-13 DIAGNOSIS — Z803 Family history of malignant neoplasm of breast: Secondary | ICD-10-CM | POA: Diagnosis not present

## 2021-10-13 DIAGNOSIS — Z923 Personal history of irradiation: Secondary | ICD-10-CM | POA: Diagnosis not present

## 2021-10-14 ENCOUNTER — Other Ambulatory Visit (HOSPITAL_COMMUNITY): Payer: Self-pay

## 2021-10-14 DIAGNOSIS — Z803 Family history of malignant neoplasm of breast: Secondary | ICD-10-CM | POA: Diagnosis not present

## 2021-10-14 DIAGNOSIS — D649 Anemia, unspecified: Secondary | ICD-10-CM | POA: Diagnosis not present

## 2021-10-14 DIAGNOSIS — Z923 Personal history of irradiation: Secondary | ICD-10-CM | POA: Diagnosis not present

## 2021-10-14 DIAGNOSIS — R000 Tachycardia, unspecified: Secondary | ICD-10-CM | POA: Diagnosis not present

## 2021-10-14 DIAGNOSIS — C20 Malignant neoplasm of rectum: Secondary | ICD-10-CM | POA: Diagnosis not present

## 2021-10-15 DIAGNOSIS — C20 Malignant neoplasm of rectum: Secondary | ICD-10-CM | POA: Diagnosis not present

## 2021-10-15 DIAGNOSIS — Z803 Family history of malignant neoplasm of breast: Secondary | ICD-10-CM | POA: Diagnosis not present

## 2021-10-15 DIAGNOSIS — R000 Tachycardia, unspecified: Secondary | ICD-10-CM | POA: Diagnosis not present

## 2021-10-15 DIAGNOSIS — Z923 Personal history of irradiation: Secondary | ICD-10-CM | POA: Diagnosis not present

## 2021-10-15 DIAGNOSIS — D649 Anemia, unspecified: Secondary | ICD-10-CM | POA: Diagnosis not present

## 2021-10-16 ENCOUNTER — Inpatient Hospital Stay (HOSPITAL_BASED_OUTPATIENT_CLINIC_OR_DEPARTMENT_OTHER): Payer: Medicare HMO | Admitting: Hematology

## 2021-10-16 ENCOUNTER — Inpatient Hospital Stay: Payer: Medicare HMO

## 2021-10-16 ENCOUNTER — Inpatient Hospital Stay: Payer: Medicare HMO | Admitting: Dietician

## 2021-10-16 DIAGNOSIS — C20 Malignant neoplasm of rectum: Secondary | ICD-10-CM | POA: Diagnosis not present

## 2021-10-16 DIAGNOSIS — Z803 Family history of malignant neoplasm of breast: Secondary | ICD-10-CM | POA: Diagnosis not present

## 2021-10-16 DIAGNOSIS — Z923 Personal history of irradiation: Secondary | ICD-10-CM | POA: Diagnosis not present

## 2021-10-16 DIAGNOSIS — D649 Anemia, unspecified: Secondary | ICD-10-CM

## 2021-10-16 DIAGNOSIS — Z808 Family history of malignant neoplasm of other organs or systems: Secondary | ICD-10-CM | POA: Diagnosis not present

## 2021-10-16 DIAGNOSIS — R000 Tachycardia, unspecified: Secondary | ICD-10-CM | POA: Diagnosis not present

## 2021-10-16 LAB — CBC WITH DIFFERENTIAL/PLATELET
Abs Immature Granulocytes: 0.04 10*3/uL (ref 0.00–0.07)
Basophils Absolute: 0 10*3/uL (ref 0.0–0.1)
Basophils Relative: 0 %
Eosinophils Absolute: 0.1 10*3/uL (ref 0.0–0.5)
Eosinophils Relative: 2 %
HCT: 31.8 % — ABNORMAL LOW (ref 36.0–46.0)
Hemoglobin: 10.7 g/dL — ABNORMAL LOW (ref 12.0–15.0)
Immature Granulocytes: 1 %
Lymphocytes Relative: 10 %
Lymphs Abs: 0.5 10*3/uL — ABNORMAL LOW (ref 0.7–4.0)
MCH: 30.9 pg (ref 26.0–34.0)
MCHC: 33.6 g/dL (ref 30.0–36.0)
MCV: 91.9 fL (ref 80.0–100.0)
Monocytes Absolute: 0.3 10*3/uL (ref 0.1–1.0)
Monocytes Relative: 5 %
Neutro Abs: 4.4 10*3/uL (ref 1.7–7.7)
Neutrophils Relative %: 82 %
Platelets: 304 10*3/uL (ref 150–400)
RBC: 3.46 MIL/uL — ABNORMAL LOW (ref 3.87–5.11)
RDW: 16.1 % — ABNORMAL HIGH (ref 11.5–15.5)
WBC: 5.4 10*3/uL (ref 4.0–10.5)
nRBC: 0 % (ref 0.0–0.2)

## 2021-10-16 LAB — COMPREHENSIVE METABOLIC PANEL
ALT: 11 U/L (ref 0–44)
AST: 16 U/L (ref 15–41)
Albumin: 3.3 g/dL — ABNORMAL LOW (ref 3.5–5.0)
Alkaline Phosphatase: 52 U/L (ref 38–126)
Anion gap: 8 (ref 5–15)
BUN: 13 mg/dL (ref 8–23)
CO2: 26 mmol/L (ref 22–32)
Calcium: 8.4 mg/dL — ABNORMAL LOW (ref 8.9–10.3)
Chloride: 99 mmol/L (ref 98–111)
Creatinine, Ser: 0.65 mg/dL (ref 0.44–1.00)
GFR, Estimated: >60 mL/min (ref >60)
Glucose, Bld: 141 mg/dL — ABNORMAL HIGH (ref 70–99)
Potassium: 3.3 mmol/L — ABNORMAL LOW (ref 3.5–5.1)
Sodium: 133 mmol/L — ABNORMAL LOW (ref 135–145)
Total Bilirubin: 0.7 mg/dL (ref 0.3–1.2)
Total Protein: 6 g/dL — ABNORMAL LOW (ref 6.5–8.1)

## 2021-10-16 LAB — VITAMIN B12: Vitamin B-12: 586 pg/mL (ref 180–914)

## 2021-10-16 LAB — IRON AND TIBC
Iron: 82 ug/dL (ref 28–170)
Saturation Ratios: 30 % (ref 10.4–31.8)
TIBC: 273 ug/dL (ref 250–450)
UIBC: 191 ug/dL

## 2021-10-16 LAB — MAGNESIUM: Magnesium: 1.9 mg/dL (ref 1.7–2.4)

## 2021-10-16 LAB — FOLATE: Folate: 23.9 ng/mL (ref >5.9)

## 2021-10-16 LAB — FERRITIN: Ferritin: 28 ng/mL (ref 11–307)

## 2021-10-16 NOTE — Progress Notes (Signed)
[place], [place] [address]   CLINIC:  Medical Oncology/Hematology  PCP:  Chevis Pretty, [place] [address] [phone]   REASON FOR VISIT:  Follow-up for stage III rectal cancer  PRIOR THERAPY: None  NGS Results: MMR-preserved  CURRENT THERAPY: Xeloda and radiation  BRIEF ONCOLOGIC HISTORY:  Oncology History   No history exists.    CANCER STAGING:  Cancer Staging  Rectal cancer [facility]) Staging form: Colon and Rectum, AJCC 8th Edition - Clinical stage from 09/16/2021: cT3, cN2, cM0 - Unsigned    INTERVAL HISTORY:  [place] 76 y.o. female seen for follow-up of rectal cancer.  She is tolerating Xeloda reasonably well and takes 3 tablets twice daily.  She had diarrhea for 1 day yesterday, 6-7 episodes in the morning.  Imodium helped.  She also had 2 episodes of vomiting in the last 7 days.  She has nausea on and off.  Denies any pain in the hands and feet or skin peeling off.    REVIEW OF SYSTEMS:  Review of Systems  Gastrointestinal:  Positive for diarrhea and nausea.  Psychiatric/Behavioral:  Negative for sleep disturbance. The patient is not nervous/anxious.   All other systems reviewed and are negative.    PAST MEDICAL/SURGICAL HISTORY:  Past Medical History:  Diagnosis Date   Allergy    Anemia    Asthma    Elevated cholesterol    GERD (gastroesophageal reflux disease)    Hypertension    Past Surgical History:  Procedure Laterality Date   BREAST SURGERY  80's   right breast   ORIF ANKLE FRACTURE Right 11/06/2013   Procedure: OPEN REDUCTION INTERNAL FIXATION (ORIF) ANKLE FRACTURE;  Surgeon: Sanjuana Kava, MD;  Location: [facility];  Service: Orthopedics;  Laterality: Right;   TUBAL LIGATION       SOCIAL HISTORY:  Social History   Socioeconomic History   Marital status: Widowed    Spouse name: Jori Moll   Number of children: 1   Years of education: 12   Highest education level:  12th grade  Occupational History   Occupation: Retired  Tobacco Use   Smoking status: Never   Smokeless tobacco: Never  Vaping Use   Vaping Use: Never used  Substance and Sexual Activity   Alcohol use: No   Drug use: No   Sexual activity: Not Currently  Other Topics Concern   Not on file  Social History Narrative   Husband passed 2021   Son lives in [place] Determinants of Health   Financial Resource Strain: Low Risk  (10/28/2020)   Overall Financial Resource Strain (CARDIA)    Difficulty of Paying Living Expenses: Not hard at all  Food Insecurity: No Food Insecurity (10/28/2020)   Hunger Vital Sign    Worried About Running Out of Food in the Last Year: Never true    [place] in the Last Year: Never true  Transportation Needs: No Transportation Needs (10/28/2020)   PRAPARE - Hydrologist (Medical): No    Lack of Transportation (Non-Medical): No  Physical Activity: Insufficiently Active (10/28/2020)   Exercise Vital Sign    Days of Exercise per Week: 7 days    Minutes of Exercise per Session: 20 min  Stress: No Stress Concern Present (10/28/2020)   [place]    Feeling of Stress : Not at all  Social Connections: Moderately  Integrated (10/28/2020)   Social Connection and Isolation Panel [NHANES]    Frequency of Communication with Friends and Family: More than three times a week    Frequency of Social Gatherings with Friends and Family: Twice a week    Attends Religious Services: More than 4 times per year    Active Member of [employer] or Organizations: Yes    Attends Archivist Meetings: More than 4 times per year    Marital Status: Widowed  Intimate Partner Violence: Not At Risk (10/28/2020)   Humiliation, Afraid, Rape, and Kick questionnaire    Fear of Current or Ex-Partner: No    Emotionally Abused: No    Physically Abused: No    Sexually Abused: No     FAMILY HISTORY:  Family History  Problem Relation Age of Onset   Hypertension Mother    Diabetes Mother    Hypertension Father    Heart disease Father    Cancer Sister        brain, breast   Hypertension Sister    Diabetes Sister    Hypertension Sister    Heart disease Brother        congestive heart failure   Diabetes Maternal Grandmother    Diabetes Paternal Grandmother    Hypertension Son    Diabetes Son    Stomach cancer Neg Hx    Esophageal cancer Neg Hx    Colon cancer Neg Hx     CURRENT MEDICATIONS:  Outpatient Encounter Medications as of 10/16/2021  Medication Sig Note   capecitabine (XELODA) 500 MG tablet Take 3 tablets (1,500 mg total) by mouth 2 (two) times daily after a meal. Take Monday-Friday. Take only on days of radiation.    fluticasone (FLONASE) 50 MCG/ACT nasal spray Place 2 sprays into both nostrils daily. (Patient taking differently: Place 2 sprays into both nostrils daily as needed.) 07/29/2021: As needed   Fluticasone Furoate (ARNUITY ELLIPTA) 100 MCG/ACT AEPB Inhale 1 Dose into the lungs daily. Take one inhalation orally daily.    Iron, Ferrous Sulfate, 325 (65 Fe) MG TABS Take 325 mg by mouth daily.    olmesartan-hydrochlorothiazide (BENICAR HCT) 40-25 MG tablet TAKE ONE (1) TABLET EACH DAY (Patient taking differently: TAKE ONE (1/2) TABLET EACH DAY)    prochlorperazine (COMPAZINE) 10 MG tablet Take 1 tablet (10 mg total) by mouth every 6 (six) hours as needed for nausea or vomiting.    Vitamin D, Ergocalciferol, (DRISDOL) 1.25 MG (50000 UNIT) CAPS capsule Take 1 capsule (50,000 Units total) by mouth every 7 (seven) days.    No facility-administered encounter medications on file as of 10/16/2021.    ALLERGIES:  Allergies  Allergen Reactions   Ibuprofen Rash     PHYSICAL EXAM:  ECOG Performance status: 1  Vitals:   10/16/21 1522  BP: 121/75  Pulse: 97  Resp: 18  Temp: 97.9 F (36.6 C)  SpO2: 97%   Filed Weights   10/16/21 1522   Weight: 123 lb 3.2 oz (55.9 kg)   Physical Exam Vitals reviewed.  Constitutional:      Appearance: Normal appearance.  Cardiovascular:     Rate and Rhythm: Normal rate and regular rhythm.     Heart sounds: Normal heart sounds.  Pulmonary:     Breath sounds: Normal breath sounds.  Abdominal:     Palpations: Abdomen is soft. There is no mass.  Neurological:     General: No focal deficit present.     Mental Status: She is alert and oriented  to person, place, and time.  Psychiatric:        Behavior: Behavior normal.      LABORATORY DATA:  I have reviewed the labs as listed.  CBC    Component Value Date/Time   WBC 5.4 10/16/2021 1404   RBC 3.46 (L) 10/16/2021 1404   HGB 10.7 (L) 10/16/2021 1404   HGB 12.6 08/25/2021 1230   HCT 31.8 (L) 10/16/2021 1404   HCT 38.3 08/25/2021 1230   PLT 304 10/16/2021 1404   PLT 382 08/25/2021 1230   MCV 91.9 10/16/2021 1404   MCV 86 08/25/2021 1230   MCH 30.9 10/16/2021 1404   MCHC 33.6 10/16/2021 1404   RDW 16.1 (H) 10/16/2021 1404   RDW 19.1 (H) 08/25/2021 1230   LYMPHSABS 0.5 (L) 10/16/2021 1404   LYMPHSABS 1.4 08/25/2021 1230   MONOABS 0.3 10/16/2021 1404   EOSABS 0.1 10/16/2021 1404   EOSABS 0.3 08/25/2021 1230   BASOSABS 0.0 10/16/2021 1404   BASOSABS 0.1 08/25/2021 1230      Latest Ref Rng & Units 10/16/2021    2:04 PM 10/09/2021   10:17 AM 08/25/2021   12:30 PM  CMP  Glucose 70 - 99 mg/dL 141  124  102   BUN 8 - 23 mg/dL _0 Creatinine 0.44 - 1.00 mg/dL 0.65  0.80  0.83   Sodium 135 - 145 mmol/L 133  134  139   Potassium 3.5 - 5.1 mmol/L 3.3  4.1  4.0   Chloride 98 - 111 mmol/L 99  99  98   CO2 22 - 32 mmol/L _1 Calcium 8.9 - 10.3 mg/dL 8.4  9.2  9.6   Total Protein 6.5 - 8.1 g/dL 6.0  6.7  6.6   Total Bilirubin 0.3 - 1.2 mg/dL 0.7  0.9  0.2   Alkaline Phos 38 - 126 U/L 52  58  108   AST 15 - 41 U/L _2 ALT 0 - 44 U/L _3 DIAGNOSTIC IMAGING:  I have independently reviewed  the scans and discussed with the patient.  ASSESSMENT: Stage III (T3N2) mid to high rectal cancer: - Presentation with occasional BRBPR for 3 months.  24 pound weight loss in the last 6 months, mostly from C. difficile infection. - Colonoscopy (08/12/2021): Ulcerated nonobstructing large mass found in the rectum, approximately 10 cm from anal verge.  Near circumferential apple core lesion. - Pathology: Moderately differentiated invasive adenocarcinoma, - CT CAP (08/21/2021): Irregular wall thickening in the mid rectum involving 3.5 cm length segment, several enlarged rounded perirectal lymph nodes.  Several scattered small solid lung nodules bilaterally, largest 0.4 cm.  Scattered subcentimeter hypodense liver lesions too small to characterize. - MRI pelvis (09/07/2021): Circumferential annular type lesion in the mid to high rectum extending up to 6 mm beyond the muscularis layer compatible with T3c disease.  N2 disease with lymph nodes in the mesorectum and along the superior rectal vein. - CEA (08/12/2021): 3.4. - She was already evaluated by Dr. Marcello Moores - She has left foot numbness since 2015 from back pain.  She does report occasional rectal pain when she has frequent bowel movements.       -We have discussed total neoadjuvant chemotherapy but she refused.       -MRI of the liver (09/29/2021): No suspicious hepatic lesion.       - Xeloda (1500 mg  twice daily) with XRT started on 10/02/2021    Social/family history: - Lives by herself at home.  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  She was self employed prior to that.  Non-smoker. - Sister had metastatic breast cancer.  Paternal aunt had cancer.   PLAN:  1.  Stage III (T3N2) rectal cancer, MMR preserved: -She had diarrhea yesterday 6-7 times in the morning.  Use Imodium as needed. - Had 2 episodes of vomiting and on and off nausea over the last 1 week.  Uses Compazine as needed which is helping. - Does not  have any hand-foot skin reaction or mucositis at this time. - Reviewed labs today which showed normal LFTs.  Electrolytes are grossly normal.  CBC has adequate white count and platelet count.  Last CEA was 4.3. - Continue Xeloda 1500 mg twice daily Monday through Friday during the days of radiation.  She was told to hold next Monday when she does not get radiation.  RTC 1 week for follow-up.  2.  Normocytic anemia: - Hemoglobin is 10.7 today. - Ferritin is 28 and percent saturation 30.  G86 and folic acid was normal. - Continue iron tablet daily.  If there is drop of hemoglobin below 9, consider parenteral iron therapy.      Orders placed this encounter:  Orders Placed This Encounter  Procedures   CBC with Differential/Platelet   Comprehensive metabolic panel   Magnesium      Derek Jack, MD [place] [phone]

## 2021-10-16 NOTE — Progress Notes (Signed)
Nutrition Assessment   Reason for Assessment: weight loss    ASSESSMENT: 76 year old female with rectal cancer. She is receiving concurrent radiation with xeloda.   Met with patient and son in clinic. She reports feeling unwell today. Patient says she usually is able to go home and rest after radiation but came directly to oncology appointment today. Patient unable to get comfortable sitting in chair during visit. Patient is having pain. She reports poor appetite secondary to nausea. Patient reports episodes of diarrhea yesterday morning. This resolved after taking imodium. Son reports taking her for a burger and fries after treatment. Patient did not tolerate this and vomited after eating. Patient reports tolerating mostly creamed potatoes, mac/cheese, potato soup, eggs, baked potatoes. She is drinking fairlife core power once daily. Son purchased Sh[place]augh to aid with nausea/diarrhea. Patient had episode of vomiting after drinking. She is drinking a little water. Patient has gatorade with breakfast.    Nutrition Focused Physical Exam: deferred - pt not feeling well   Medications: Compazine, Drisdol, Ferrous sulfate  Labs: Na 133, K 3.3, Glucose 141   Anthropometrics:   Height: 5'3" Weight: 123 lb 3.2 oz UBW: 144 lb (Jan 2023) BMI: 21.82   NUTRITION DIAGNOSIS: Unintentional weight loss related to rectal cancer as evidenced by nausea, diarrhea, 8.9% (12 lb) weight loss in 2 months which is severe for time frame.   INTERVENTION:  Educated on strategies for nausea, diarrhea, foods best tolerated and foods to avoid - handouts provided Discussed importance of hydration, pt will work to increase intake of water - samples of Pedialyte powder given  Encouraged small frequent meals and snacks Discussed foods with protein - handout with list provided Continue drinking oral supplements, suggested switching to Ensure Plus/equivalent for added calories - twice daily One complimentary case  Ensure Plus High Protein provided Contact information given    MONITORING, EVALUATION, GOAL: Pt will tolerate increased calories and protein to minimize further weight loss    Next Visit: To be scheduled

## 2021-10-16 NOTE — Patient Instructions (Addendum)
[facility]  Discharge Instructions  You were seen and examined today by Dr. Delton Coombes.  Dr. Delton Coombes discussed your most recent lab work and everything looks okay. Your iron levels are still on the lower side of normal. Continue taking over the counter Iron.   Follow-up as scheduled in 1 week with labs.    Thank you for choosing [facility] to provide your oncology and hematology care.   To afford each patient quality time with our provider, please arrive at least 15 minutes before your scheduled appointment time. You may need to reschedule your appointment if you arrive late (10 or more minutes). Arriving late affects you and other patients whose appointments are after yours.  Also, if you miss three or more appointments without notifying the office, you may be dismissed from the clinic at the provider's discretion.    Again, thank you for choosing [facility].  Our hope is that these requests will decrease the amount of time that you wait before being seen by our physicians.   If you have a lab appointment with the [facility] please come in thru the Main Entrance and check in at the main information desk.           _____________________________________________________________  Should you have questions after your visit to [facility], please contact our office at [phone] and follow the prompts.  Our office hours are 8:00 a.m. to 4:30 p.m. Monday - Thursday and 8:00 a.m. to 2:30 p.m. Friday.  Please note that voicemails left after 4:00 p.m. may not be returned until the following business day.  We are closed weekends and all major holidays.  You do have access to a nurse 24-7, just call the main number to the clinic [phone] and do not press any options, hold on the line and a nurse will answer the phone.    For prescription refill requests, have your pharmacy contact our office and allow 72  hours.    Masks are optional in the cancer centers. If you would like for your care team to wear a mask while they are taking care of you, please let them know. You may have one support person who is at least 78 years old accompany you for your appointments.

## 2021-10-17 DIAGNOSIS — C20 Malignant neoplasm of rectum: Secondary | ICD-10-CM | POA: Diagnosis not present

## 2021-10-17 DIAGNOSIS — Z923 Personal history of irradiation: Secondary | ICD-10-CM | POA: Diagnosis not present

## 2021-10-17 DIAGNOSIS — Z803 Family history of malignant neoplasm of breast: Secondary | ICD-10-CM | POA: Diagnosis not present

## 2021-10-17 DIAGNOSIS — R000 Tachycardia, unspecified: Secondary | ICD-10-CM | POA: Diagnosis not present

## 2021-10-17 DIAGNOSIS — D649 Anemia, unspecified: Secondary | ICD-10-CM | POA: Diagnosis not present

## 2021-10-21 ENCOUNTER — Other Ambulatory Visit: Payer: Self-pay

## 2021-10-21 DIAGNOSIS — R000 Tachycardia, unspecified: Secondary | ICD-10-CM | POA: Diagnosis not present

## 2021-10-21 DIAGNOSIS — C20 Malignant neoplasm of rectum: Secondary | ICD-10-CM | POA: Diagnosis not present

## 2021-10-21 DIAGNOSIS — Z923 Personal history of irradiation: Secondary | ICD-10-CM | POA: Diagnosis not present

## 2021-10-21 DIAGNOSIS — D649 Anemia, unspecified: Secondary | ICD-10-CM | POA: Diagnosis not present

## 2021-10-21 DIAGNOSIS — Z803 Family history of malignant neoplasm of breast: Secondary | ICD-10-CM | POA: Diagnosis not present

## 2021-10-21 MED ORDER — DIPHENOXYLATE-ATROPINE 2.5-0.025 MG PO TABS: 1.0000 | ORAL_TABLET | Freq: Four times a day (QID) | ORAL | 0 refills | Status: AC | PRN

## 2021-10-22 DIAGNOSIS — R000 Tachycardia, unspecified: Secondary | ICD-10-CM | POA: Diagnosis not present

## 2021-10-22 DIAGNOSIS — Z923 Personal history of irradiation: Secondary | ICD-10-CM | POA: Diagnosis not present

## 2021-10-22 DIAGNOSIS — Z803 Family history of malignant neoplasm of breast: Secondary | ICD-10-CM | POA: Diagnosis not present

## 2021-10-22 DIAGNOSIS — D649 Anemia, unspecified: Secondary | ICD-10-CM | POA: Diagnosis not present

## 2021-10-22 DIAGNOSIS — C20 Malignant neoplasm of rectum: Secondary | ICD-10-CM | POA: Diagnosis not present

## 2021-10-23 ENCOUNTER — Encounter: Payer: Self-pay | Admitting: Hematology

## 2021-10-23 ENCOUNTER — Inpatient Hospital Stay: Payer: Medicare HMO

## 2021-10-23 ENCOUNTER — Inpatient Hospital Stay (HOSPITAL_BASED_OUTPATIENT_CLINIC_OR_DEPARTMENT_OTHER): Payer: Medicare HMO | Admitting: Hematology

## 2021-10-23 ENCOUNTER — Inpatient Hospital Stay: Payer: Medicare HMO | Attending: Hematology

## 2021-10-23 VITALS — BP 119/66 | HR 112 | Temp 98.0°F | Resp 20 | Wt 122.6 lb

## 2021-10-23 DIAGNOSIS — E871 Hypo-osmolality and hyponatremia: Secondary | ICD-10-CM | POA: Insufficient documentation

## 2021-10-23 DIAGNOSIS — D649 Anemia, unspecified: Secondary | ICD-10-CM

## 2021-10-23 DIAGNOSIS — C20 Malignant neoplasm of rectum: Secondary | ICD-10-CM | POA: Insufficient documentation

## 2021-10-23 DIAGNOSIS — R000 Tachycardia, unspecified: Secondary | ICD-10-CM | POA: Diagnosis not present

## 2021-10-23 DIAGNOSIS — E876 Hypokalemia: Secondary | ICD-10-CM | POA: Insufficient documentation

## 2021-10-23 DIAGNOSIS — Z803 Family history of malignant neoplasm of breast: Secondary | ICD-10-CM | POA: Diagnosis not present

## 2021-10-23 DIAGNOSIS — Z923 Personal history of irradiation: Secondary | ICD-10-CM | POA: Diagnosis not present

## 2021-10-23 LAB — CBC WITH DIFFERENTIAL/PLATELET
Abs Immature Granulocytes: 0.03 10*3/uL (ref 0.00–0.07)
Basophils Absolute: 0 10*3/uL (ref 0.0–0.1)
Basophils Relative: 0 %
Eosinophils Absolute: 0.1 10*3/uL (ref 0.0–0.5)
Eosinophils Relative: 3 %
HCT: 31.7 % — ABNORMAL LOW (ref 36.0–46.0)
Hemoglobin: 10.8 g/dL — ABNORMAL LOW (ref 12.0–15.0)
Immature Granulocytes: 1 %
Lymphocytes Relative: 5 %
Lymphs Abs: 0.2 10*3/uL — ABNORMAL LOW (ref 0.7–4.0)
MCH: 30.9 pg (ref 26.0–34.0)
MCHC: 34.1 g/dL (ref 30.0–36.0)
MCV: 90.6 fL (ref 80.0–100.0)
Monocytes Absolute: 0.2 10*3/uL (ref 0.1–1.0)
Monocytes Relative: 7 %
Neutro Abs: 3.1 10*3/uL (ref 1.7–7.7)
Neutrophils Relative %: 84 %
Platelets: 262 10*3/uL (ref 150–400)
RBC: 3.5 MIL/uL — ABNORMAL LOW (ref 3.87–5.11)
RDW: 17.2 % — ABNORMAL HIGH (ref 11.5–15.5)
WBC: 3.6 10*3/uL — ABNORMAL LOW (ref 4.0–10.5)
nRBC: 0 % (ref 0.0–0.2)

## 2021-10-23 LAB — COMPREHENSIVE METABOLIC PANEL
ALT: 10 U/L (ref 0–44)
AST: 15 U/L (ref 15–41)
Albumin: 3 g/dL — ABNORMAL LOW (ref 3.5–5.0)
Alkaline Phosphatase: 46 U/L (ref 38–126)
Anion gap: 7 (ref 5–15)
BUN: 16 mg/dL (ref 8–23)
CO2: 27 mmol/L (ref 22–32)
Calcium: 8.2 mg/dL — ABNORMAL LOW (ref 8.9–10.3)
Chloride: 95 mmol/L — ABNORMAL LOW (ref 98–111)
Creatinine, Ser: 0.68 mg/dL (ref 0.44–1.00)
GFR, Estimated: >60 mL/min (ref >60)
Glucose, Bld: 143 mg/dL — ABNORMAL HIGH (ref 70–99)
Potassium: 3 mmol/L — ABNORMAL LOW (ref 3.5–5.1)
Sodium: 129 mmol/L — ABNORMAL LOW (ref 135–145)
Total Bilirubin: 0.6 mg/dL (ref 0.3–1.2)
Total Protein: 5.6 g/dL — ABNORMAL LOW (ref 6.5–8.1)

## 2021-10-23 LAB — MAGNESIUM: Magnesium: 1.9 mg/dL (ref 1.7–2.4)

## 2021-10-23 MED ORDER — POTASSIUM CHLORIDE IN NACL 20-0.9 MEQ/L-% IV SOLN
Freq: Once | INTRAVENOUS | Status: AC
  Filled 2021-10-23: qty 1000

## 2021-10-23 MED ORDER — MAGNESIUM SULFATE 2 GM/50ML IV SOLN
2.0000 g | Freq: Once | INTRAVENOUS | Status: AC
  Administered 2021-10-23: 2 g via INTRAVENOUS

## 2021-10-23 MED ORDER — SODIUM CHLORIDE 0.9 % IV SOLN: INTRAVENOUS | Status: DC

## 2021-10-23 MED ORDER — POTASSIUM CHLORIDE CRYS ER 20 MEQ PO TBCR
40.0000 meq | EXTENDED_RELEASE_TABLET | Freq: Once | ORAL | Status: AC
  Administered 2021-10-23: 40 meq via ORAL
  Filled 2021-10-23: qty 2

## 2021-10-23 NOTE — Progress Notes (Signed)
Patient tolerated hydration with no complaints voiced.  Peripheral IV site clean and dry with good blood return noted before and after hydration.  No bruising or swelling noted with port.  Band aid applied.  VSS with discharge and left ambulatory with no s/s of distress noted.

## 2021-10-23 NOTE — Patient Instructions (Signed)
[place] at [facility] Discharge Instructions   You were seen and examined today by Dr. Delton Coombes.  He reviewed the results of your lab work which are mostly normal/stable. Your sodium and potassium are low. We will give you potassium pills today in the clinic. Try to eat more salty foods.   Increase Lomotil to 2 pills at a time to help you control the diarrhea.  Continue Boost.   We will see you back in 1 week to recheck blood work.    Thank you for choosing [facility] at [facility] to provide your oncology and hematology care.  To afford each patient quality time with our provider, please arrive at least 15 minutes before your scheduled appointment time.   If you have a lab appointment with the [facility] please come in thru the Main Entrance and check in at the main information desk.  You need to re-schedule your appointment should you arrive 10 or more minutes late.  We strive to give you quality time with our providers, and arriving late affects you and other patients whose appointments are after yours.  Also, if you no show three or more times for appointments you may be dismissed from the clinic at the providers discretion.     Again, thank you for choosing [facility].  Our hope is that these requests will decrease the amount of time that you wait before being seen by our physicians.       _____________________________________________________________  Should you have questions after your visit to [facility], please contact our office at [phone] and follow the prompts.  Our office hours are 8:00 a.m. and 4:30 p.m. Monday - Friday.  Please note that voicemails left after 4:00 p.m. may not be returned until the following business day.  We are closed weekends and major holidays.  You do have access to a nurse 24-7, just call the main number to the clinic [phone] and do not press any options, hold  on the line and a nurse will answer the phone.    For prescription refill requests, have your pharmacy contact our office and allow 72 hours.    Due to Covid, you will need to wear a mask upon entering the hospital. If you do not have a mask, a mask will be given to you at the Main Entrance upon arrival. For doctor visits, patients may have 1 support person age 79 or older with them. For treatment visits, patients can not have anyone with them due to social distancing guidelines and our immunocompromised population.

## 2021-10-23 NOTE — Patient Instructions (Signed)
[facility]  Discharge Instructions: Thank you for choosing [facility] to provide your oncology and hematology care.  If you have a lab appointment with the Cancer Center, please come in thru the Main Entrance and check in at the main information desk.  Wear comfortable clothing and clothing appropriate for easy access to any Portacath or PICC line.   We strive to give you quality time with your provider. You may need to reschedule your appointment if you arrive late (15 or more minutes).  Arriving late affects you and other patients whose appointments are after yours.  Also, if you miss three or more appointments without notifying the office, you may be dismissed from the clinic at the provider's discretion.      For prescription refill requests, have your pharmacy contact our office and allow 72 hours for refills to be completed.     To help prevent nausea and vomiting after your treatment, we encourage you to take your nausea medication as directed.  BELOW ARE SYMPTOMS THAT SHOULD BE REPORTED IMMEDIATELY: *FEVER GREATER THAN 100.4 F (38 C) OR HIGHER *CHILLS OR SWEATING *NAUSEA AND VOMITING THAT IS NOT CONTROLLED WITH YOUR NAUSEA MEDICATION *UNUSUAL SHORTNESS OF BREATH *UNUSUAL BRUISING OR BLEEDING *URINARY PROBLEMS (pain or burning when urinating, or frequent urination) *BOWEL PROBLEMS (unusual diarrhea, constipation, pain near the anus) TENDERNESS IN MOUTH AND THROAT WITH OR WITHOUT PRESENCE OF ULCERS (sore throat, sores in mouth, or a toothache) UNUSUAL RASH, SWELLING OR PAIN  UNUSUAL VAGINAL DISCHARGE OR ITCHING   Items with * indicate a potential emergency and should be followed up as soon as possible or go to the Emergency Department if any problems should occur.  Please show the CHEMOTHERAPY ALERT CARD or IMMUNOTHERAPY ALERT CARD at check-in to the Emergency Department and triage nurse.  Should you have questions after your visit or need to  cancel or reschedule your appointment, please contact [facility] [phone]  and follow the prompts.  Office hours are 8:00 a.m. to 4:30 p.m. Monday - Friday. Please note that voicemails left after 4:00 p.m. may not be returned until the following business day.  We are closed weekends and major holidays. You have access to a nurse at all times for urgent questions. Please call the main number to the clinic [phone] and follow the prompts.  For any non-urgent questions, you may also contact your provider using MyChart. We now offer e-Visits for anyone 18 and older to request care online for non-urgent symptoms. For details visit [email or web address].   Also download the MyChart app! Go to the app store, search "MyChart", open the app, select [facility], and log in with your MyChart username and password.  Masks are optional in the cancer centers. If you would like for your care team to wear a mask while they are taking care of you, please let them know. You may have one support person who is at least 78 years old accompany you for your appointments.  

## 2021-10-24 ENCOUNTER — Encounter: Payer: Self-pay | Admitting: Hematology

## 2021-10-24 DIAGNOSIS — D649 Anemia, unspecified: Secondary | ICD-10-CM | POA: Diagnosis not present

## 2021-10-24 DIAGNOSIS — C20 Malignant neoplasm of rectum: Secondary | ICD-10-CM | POA: Diagnosis not present

## 2021-10-24 DIAGNOSIS — R000 Tachycardia, unspecified: Secondary | ICD-10-CM | POA: Diagnosis not present

## 2021-10-24 DIAGNOSIS — Z803 Family history of malignant neoplasm of breast: Secondary | ICD-10-CM | POA: Diagnosis not present

## 2021-10-24 DIAGNOSIS — Z923 Personal history of irradiation: Secondary | ICD-10-CM | POA: Diagnosis not present

## 2021-10-24 LAB — CEA: CEA: 2.6 ng/mL (ref 0.0–4.7)

## 2021-10-24 NOTE — Progress Notes (Signed)
[place], [place] [address]   CLINIC:  Medical Oncology/Hematology  PCP:  Chevis Pretty, [address] [place] [address] [phone]   REASON FOR VISIT:  Follow-up for stage III rectal cancer  PRIOR THERAPY: None  NGS Results: MMR-preserved  CURRENT THERAPY: Xeloda and radiation  BRIEF ONCOLOGIC HISTORY:  Oncology History   No history exists.    CANCER STAGING:  Cancer Staging  Rectal cancer [facility]) Staging form: Colon and Rectum, AJCC 8th Edition - Clinical stage from 09/16/2021: cT3, cN2, cM0 - Unsigned    INTERVAL HISTORY:  Ms. Dardis 76 y.o. female seen for follow-up of rectal cancer.  She is undergoing concurrent chemoradiation therapy.  She reports taking Xeloda 3 tablets twice daily.  She had some nausea and vomited once or twice since last week.  She reports nausea every day after lunch.  Lost about 1 pound due to decreased eating.  Drinking about 1-1 and half cans of boost per day.   REVIEW OF SYSTEMS:  Review of Systems  Gastrointestinal:  Positive for diarrhea and nausea.  Psychiatric/Behavioral:  Negative for sleep disturbance. The patient is not nervous/anxious.   All other systems reviewed and are negative.    PAST MEDICAL/SURGICAL HISTORY:  Past Medical History:  Diagnosis Date   Allergy    Anemia    Asthma    Elevated cholesterol    GERD (gastroesophageal reflux disease)    Hypertension    Past Surgical History:  Procedure Laterality Date   BREAST SURGERY  80's   right breast   ORIF ANKLE FRACTURE Right 11/06/2013   Procedure: OPEN REDUCTION INTERNAL FIXATION (ORIF) ANKLE FRACTURE;  Surgeon: Sanjuana Kava, MD;  Location: [facility];  Service: Orthopedics;  Laterality: Right;   TUBAL LIGATION       SOCIAL HISTORY:  Social History   Socioeconomic History   Marital status: Widowed    Spouse name: Jori Moll   Number of children: 1   Years of education: 12   Highest education level:  12th grade  Occupational History   Occupation: Retired  Tobacco Use   Smoking status: Never   Smokeless tobacco: Never  Vaping Use   Vaping Use: Never used  Substance and Sexual Activity   Alcohol use: No   Drug use: No   Sexual activity: Not Currently  Other Topics Concern   Not on file  Social History Narrative   Husband passed 2021   Son lives in [place] Determinants of Health   Financial Resource Strain: Low Risk  (10/28/2020)   Overall Financial Resource Strain (CARDIA)    Difficulty of Paying Living Expenses: Not hard at all  Food Insecurity: No Food Insecurity (10/28/2020)   Hunger Vital Sign    Worried About Running Out of Food in the Last Year: Never true    [place] in the Last Year: Never true  Transportation Needs: No Transportation Needs (10/28/2020)   PRAPARE - Hydrologist (Medical): No    Lack of Transportation (Non-Medical): No  Physical Activity: Insufficiently Active (10/28/2020)   Exercise Vital Sign    Days of Exercise per Week: 7 days    Minutes of Exercise per Session: 20 min  Stress: No Stress Concern Present (10/28/2020)   [place]    Feeling of Stress : Not at all  Social Connections: Moderately Integrated (10/28/2020)   Social Connection  and Isolation Panel [NHANES]    Frequency of Communication with Friends and Family: More than three times a week    Frequency of Social Gatherings with Friends and Family: Twice a week    Attends Religious Services: More than 4 times per year    Active Member of [employer] or Organizations: Yes    Attends Archivist Meetings: More than 4 times per year    Marital Status: Widowed  Intimate Partner Violence: Not At Risk (10/28/2020)   Humiliation, Afraid, Rape, and Kick questionnaire    Fear of Current or Ex-Partner: No    Emotionally Abused: No    Physically Abused: No    Sexually Abused: No     FAMILY HISTORY:  Family History  Problem Relation Age of Onset   Hypertension Mother    Diabetes Mother    Hypertension Father    Heart disease Father    Cancer Sister        brain, breast   Hypertension Sister    Diabetes Sister    Hypertension Sister    Heart disease Brother        congestive heart failure   Diabetes Maternal Grandmother    Diabetes Paternal Grandmother    Hypertension Son    Diabetes Son    Stomach cancer Neg Hx    Esophageal cancer Neg Hx    Colon cancer Neg Hx     CURRENT MEDICATIONS:  Outpatient Encounter Medications as of 10/23/2021  Medication Sig Note   capecitabine (XELODA) 500 MG tablet Take 3 tablets (1,500 mg total) by mouth 2 (two) times daily after a meal. Take Monday-Friday. Take only on days of radiation.    diphenoxylate-atropine (LOMOTIL) 2.5-0.025 MG tablet Take 1 tablet by mouth 4 (four) times daily as needed for diarrhea or loose stools.    fluticasone (FLONASE) 50 MCG/ACT nasal spray Place 2 sprays into both nostrils daily. (Patient taking differently: Place 2 sprays into both nostrils daily as needed.) 07/29/2021: As needed   Fluticasone Furoate (ARNUITY ELLIPTA) 100 MCG/ACT AEPB Inhale 1 Dose into the lungs daily. Take one inhalation orally daily.    Iron, Ferrous Sulfate, 325 (65 Fe) MG TABS Take 325 mg by mouth daily.    olmesartan-hydrochlorothiazide (BENICAR HCT) 40-25 MG tablet TAKE ONE (1) TABLET EACH DAY (Patient taking differently: TAKE ONE (1/2) TABLET EACH DAY)    prochlorperazine (COMPAZINE) 10 MG tablet Take 1 tablet (10 mg total) by mouth every 6 (six) hours as needed for nausea or vomiting.    Vitamin D, Ergocalciferol, (DRISDOL) 1.25 MG (50000 UNIT) CAPS capsule Take 1 capsule (50,000 Units total) by mouth every 7 (seven) days.    No facility-administered encounter medications on file as of 10/23/2021.    ALLERGIES:  Allergies  Allergen Reactions   Ibuprofen Rash     PHYSICAL EXAM:  ECOG Performance status:  1  Vitals:   10/23/21 1219  BP: 119/66  Pulse: (!) 112  Resp: 20  Temp: 98 F (36.7 C)  SpO2: 100%   Filed Weights   10/23/21 1219  Weight: 122 lb 9.2 oz (55.6 kg)   Physical Exam Vitals reviewed.  Constitutional:      Appearance: Normal appearance.  Cardiovascular:     Rate and Rhythm: Normal rate and regular rhythm.     Heart sounds: Normal heart sounds.  Pulmonary:     Breath sounds: Normal breath sounds.  Abdominal:     Palpations: Abdomen is soft. There is no mass.  Neurological:  General: No focal deficit present.     Mental Status: She is alert and oriented to person, place, and time.  Psychiatric:        Behavior: Behavior normal.      LABORATORY DATA:  I have reviewed the labs as listed.  CBC    Component Value Date/Time   WBC 3.6 (L) 10/23/2021 1137   RBC 3.50 (L) 10/23/2021 1137   HGB 10.8 (L) 10/23/2021 1137   HGB 12.6 08/25/2021 1230   HCT 31.7 (L) 10/23/2021 1137   HCT 38.3 08/25/2021 1230   PLT 262 10/23/2021 1137   PLT 382 08/25/2021 1230   MCV 90.6 10/23/2021 1137   MCV 86 08/25/2021 1230   MCH 30.9 10/23/2021 1137   MCHC 34.1 10/23/2021 1137   RDW 17.2 (H) 10/23/2021 1137   RDW 19.1 (H) 08/25/2021 1230   LYMPHSABS 0.2 (L) 10/23/2021 1137   LYMPHSABS 1.4 08/25/2021 1230   MONOABS 0.2 10/23/2021 1137   EOSABS 0.1 10/23/2021 1137   EOSABS 0.3 08/25/2021 1230   BASOSABS 0.0 10/23/2021 1137   BASOSABS 0.1 08/25/2021 1230      Latest Ref Rng & Units 10/23/2021   11:37 AM 10/16/2021    2:04 PM 10/09/2021   10:17 AM  CMP  Glucose 70 - 99 mg/dL 143  141  124   BUN 8 - 23 mg/dL '16  13  16   ' Creatinine 0.44 - 1.00 mg/dL 0.68  0.65  0.80   Sodium 135 - 145 mmol/L 129  133  134   Potassium 3.5 - 5.1 mmol/L 3.0  3.3  4.1   Chloride 98 - 111 mmol/L 95  99  99   CO2 22 - 32 mmol/L '27  26  29   ' Calcium 8.9 - 10.3 mg/dL 8.2  8.4  9.2   Total Protein 6.5 - 8.1 g/dL 5.6  6.0  6.7   Total Bilirubin 0.3 - 1.2 mg/dL 0.6  0.7  0.9   Alkaline Phos  38 - 126 U/L 46  52  58   AST 15 - 41 U/L '15  16  17   ' ALT 0 - 44 U/L '10  11  13     ' DIAGNOSTIC IMAGING:  I have independently reviewed the scans and discussed with the patient.  ASSESSMENT: Stage III (T3N2) mid to high rectal cancer: - Presentation with occasional BRBPR for 3 months.  24 pound weight loss in the last 6 months, mostly from C. difficile infection. - Colonoscopy (08/12/2021): Ulcerated nonobstructing large mass found in the rectum, approximately 10 cm from anal verge.  Near circumferential apple core lesion. - Pathology: Moderately differentiated invasive adenocarcinoma, - CT CAP (08/21/2021): Irregular wall thickening in the mid rectum involving 3.5 cm length segment, several enlarged rounded perirectal lymph nodes.  Several scattered small solid lung nodules bilaterally, largest 0.4 cm.  Scattered subcentimeter hypodense liver lesions too small to characterize. - MRI pelvis (09/07/2021): Circumferential annular type lesion in the mid to high rectum extending up to 6 mm beyond the muscularis layer compatible with T3c disease.  N2 disease with lymph nodes in the mesorectum and along the superior rectal vein. - CEA (08/12/2021): 3.4. - She was already evaluated by Dr. Marcello Moores - She has left foot numbness since 2015 from back pain.  She does report occasional rectal pain when she has frequent bowel movements.       -We have discussed total neoadjuvant chemotherapy but she refused.       -MRI of  the liver (09/29/2021): No suspicious hepatic lesion.       - Xeloda (1500 mg twice daily) with XRT started on 10/02/2021    Social/family history: - Lives by herself at home.  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  She was self employed prior to that.  Non-smoker. - Sister had metastatic breast cancer.  Paternal aunt had cancer.   PLAN:  1.  Stage III (T3N2) rectal cancer, MMR preserved: - Reports nausea every day after lunch.  Recommend taking Compazine  10 mg at noontime and in the evening at 5 PM. - Reviewed labs today which showed hypokalemia and hyponatremia.  We will give potassium 40 mEq p.o. x1. - She will receive 1 L of fluids with electrolytes today as she is feeling weak. - She lost about 1 pound and is drinking 1-1 and half cans of boost per day. - Continue Xeloda 3 tablets twice daily Monday through Friday.  RTC 1 week for follow-up.  2.  Normocytic anemia: - Hemoglobin is 10.8 today.  Ferritin is 28 and percent saturation is 30.  Y70 folic acid normal. - Continue iron tablet daily.  Consider parenteral iron therapy if hemoglobin drops below 9.  3.  Diarrhea: - She reports diarrhea about 6-7 times in the morning and sometimes more in the evening. - Recommend taking Lomotil 2 tablets in the morning and 1 to 2 tablets in the evening as a standing dose.      Orders placed this encounter:  No orders of the defined types were placed in this encounter.     Derek Jack, MD [facility] [phone]

## 2021-10-27 DIAGNOSIS — D649 Anemia, unspecified: Secondary | ICD-10-CM | POA: Diagnosis not present

## 2021-10-27 DIAGNOSIS — R000 Tachycardia, unspecified: Secondary | ICD-10-CM | POA: Diagnosis not present

## 2021-10-27 DIAGNOSIS — C20 Malignant neoplasm of rectum: Secondary | ICD-10-CM | POA: Diagnosis not present

## 2021-10-27 DIAGNOSIS — Z803 Family history of malignant neoplasm of breast: Secondary | ICD-10-CM | POA: Diagnosis not present

## 2021-10-27 DIAGNOSIS — Z923 Personal history of irradiation: Secondary | ICD-10-CM | POA: Diagnosis not present

## 2021-10-28 DIAGNOSIS — R000 Tachycardia, unspecified: Secondary | ICD-10-CM | POA: Diagnosis not present

## 2021-10-28 DIAGNOSIS — C20 Malignant neoplasm of rectum: Secondary | ICD-10-CM | POA: Diagnosis not present

## 2021-10-28 DIAGNOSIS — Z923 Personal history of irradiation: Secondary | ICD-10-CM | POA: Diagnosis not present

## 2021-10-28 DIAGNOSIS — D649 Anemia, unspecified: Secondary | ICD-10-CM | POA: Diagnosis not present

## 2021-10-28 DIAGNOSIS — Z803 Family history of malignant neoplasm of breast: Secondary | ICD-10-CM | POA: Diagnosis not present

## 2021-10-29 ENCOUNTER — Ambulatory Visit (INDEPENDENT_AMBULATORY_CARE_PROVIDER_SITE_OTHER): Payer: Medicare HMO

## 2021-10-29 VITALS — Ht 63.0 in | Wt 122.0 lb

## 2021-10-29 DIAGNOSIS — C20 Malignant neoplasm of rectum: Secondary | ICD-10-CM | POA: Diagnosis not present

## 2021-10-29 DIAGNOSIS — Z923 Personal history of irradiation: Secondary | ICD-10-CM | POA: Diagnosis not present

## 2021-10-29 DIAGNOSIS — Z0000 Encounter for general adult medical examination without abnormal findings: Secondary | ICD-10-CM | POA: Diagnosis not present

## 2021-10-29 DIAGNOSIS — R000 Tachycardia, unspecified: Secondary | ICD-10-CM | POA: Diagnosis not present

## 2021-10-29 DIAGNOSIS — D649 Anemia, unspecified: Secondary | ICD-10-CM | POA: Diagnosis not present

## 2021-10-29 DIAGNOSIS — Z803 Family history of malignant neoplasm of breast: Secondary | ICD-10-CM | POA: Diagnosis not present

## 2021-10-29 NOTE — Patient Instructions (Signed)
Regina Knox , Thank you for taking time to come for your Medicare Wellness Visit. I appreciate your ongoing commitment to your health goals. Please review the following plan we discussed and let me know if I can assist you in the future.   Screening recommendations/referrals: Colonoscopy: 08/12/21 Mammogram: aged out Bone Density: declined Recommended yearly ophthalmology/optometry visit for glaucoma screening and checkup Recommended yearly dental visit for hygiene and checkup  Vaccinations: Influenza vaccine: 12/26/20 Pneumococcal vaccine: 11/30/14 Tdap vaccine: n/d Shingles vaccine: n/d   Covid-19:10/07/19, 11/04/19, 05/27/20  Advanced directives: no  Conditions/risks identified: none  Next appointment: Follow up in one year for your annual wellness visit 11/02/22 @ 10:30 am by phone   Preventive Care 65 Years and Older, Female Preventive care refers to lifestyle choices and visits with your health care provider that can promote health and wellness. What does preventive care include? A yearly physical exam. This is also called an annual well check. Dental exams once or twice a year. Routine eye exams. Ask your health care provider how often you should have your eyes checked. Personal lifestyle choices, including: Daily care of your teeth and gums. Regular physical activity. Eating a healthy diet. Avoiding tobacco and drug use. Limiting alcohol use. Practicing safe sex. Taking low-dose aspirin every day. Taking vitamin and mineral supplements as recommended by your health care provider. What happens during an annual well check? The services and screenings done by your health care provider during your annual well check will depend on your age, overall health, lifestyle risk factors, and family history of disease. Counseling  Your health care provider may ask you questions about your: Alcohol use. Tobacco use. Drug use. Emotional well-being. Home and relationship  well-being. Sexual activity. Eating habits. History of falls. Memory and ability to understand (cognition). Work and work Statistician. Reproductive health. Screening  You may have the following tests or measurements: Height, weight, and BMI. Blood pressure. Lipid and cholesterol levels. These may be checked every 5 years, or more frequently if you are over 61 years old. Skin check. Lung cancer screening. You may have this screening every year starting at age 46 if you have a 30-pack-year history of smoking and currently smoke or have quit within the past 15 years. Fecal occult blood test (FOBT) of the stool. You may have this test every year starting at age 81. Flexible sigmoidoscopy or colonoscopy. You may have a sigmoidoscopy every 5 years or a colonoscopy every 10 years starting at age 40. Hepatitis C blood test. Hepatitis B blood test. Sexually transmitted disease (STD) testing. Diabetes screening. This is done by checking your blood sugar (glucose) after you have not eaten for a while (fasting). You may have this done every 1-3 years. Bone density scan. This is done to screen for osteoporosis. You may have this done starting at age 79. Mammogram. This may be done every 1-2 years. Talk to your health care provider about how often you should have regular mammograms. Talk with your health care provider about your test results, treatment options, and if necessary, the need for more tests. Vaccines  Your health care provider may recommend certain vaccines, such as: Influenza vaccine. This is recommended every year. Tetanus, diphtheria, and acellular pertussis (Tdap, Td) vaccine. You may need a Td booster every 10 years. Zoster vaccine. You may need this after age 53. Pneumococcal 13-valent conjugate (PCV13) vaccine. One dose is recommended after age 83. Pneumococcal polysaccharide (PPSV23) vaccine. One dose is recommended after age 36. Talk to your health  care provider about which  screenings and vaccines you need and how often you need them. This information is not intended to replace advice given to you by your health care provider. Make sure you discuss any questions you have with your health care provider. Document Released: 03/01/2015 Document Revised: 10/23/2015 Document Reviewed: 12/04/2014 Elsevier Interactive Patient Education  2017 [employer] Prevention in the Home Falls can cause injuries. They can happen to people of all ages. There are many things you can do to make your home safe and to help prevent falls. What can I do on the outside of my home? Regularly fix the edges of walkways and driveways and fix any cracks. Remove anything that might make you trip as you walk through a door, such as a raised step or threshold. Trim any bushes or trees on the path to your home. Use bright outdoor lighting. Clear any walking paths of anything that might make someone trip, such as rocks or tools. Regularly check to see if handrails are loose or broken. Make sure that both sides of any steps have handrails. Any raised decks and porches should have guardrails on the edges. Have any leaves, snow, or ice cleared regularly. Use sand or salt on walking paths during winter. Clean up any spills in your garage right away. This includes oil or grease spills. What can I do in the bathroom? Use night lights. Install grab bars by the toilet and in the tub and shower. Do not use towel bars as grab bars. Use non-skid mats or decals in the tub or shower. If you need to sit down in the shower, use a plastic, non-slip stool. Keep the floor dry. Clean up any water that spills on the floor as soon as it happens. Remove soap buildup in the tub or shower regularly. Attach bath mats securely with double-sided non-slip rug tape. Do not have throw rugs and other things on the floor that can make you trip. What can I do in the bedroom? Use night lights. Make sure that you have a  light by your bed that is easy to reach. Do not use any sheets or blankets that are too big for your bed. They should not hang down onto the floor. Have a firm chair that has side arms. You can use this for support while you get dressed. Do not have throw rugs and other things on the floor that can make you trip. What can I do in the kitchen? Clean up any spills right away. Avoid walking on wet floors. Keep items that you use a lot in easy-to-reach places. If you need to reach something above you, use a strong step stool that has a grab bar. Keep electrical cords out of the way. Do not use floor polish or wax that makes floors slippery. If you must use wax, use non-skid floor wax. Do not have throw rugs and other things on the floor that can make you trip. What can I do with my stairs? Do not leave any items on the stairs. Make sure that there are handrails on both sides of the stairs and use them. Fix handrails that are broken or loose. Make sure that handrails are as long as the stairways. Check any carpeting to make sure that it is firmly attached to the stairs. Fix any carpet that is loose or worn. Avoid having throw rugs at the top or bottom of the stairs. If you do have throw rugs, attach them to  the floor with carpet tape. Make sure that you have a light switch at the top of the stairs and the bottom of the stairs. If you do not have them, ask someone to add them for you. What else can I do to help prevent falls? Wear shoes that: Do not have high heels. Have rubber bottoms. Are comfortable and fit you well. Are closed at the toe. Do not wear sandals. If you use a stepladder: Make sure that it is fully opened. Do not climb a closed stepladder. Make sure that both sides of the stepladder are locked into place. Ask someone to hold it for you, if possible. Clearly mark and make sure that you can see: Any grab bars or handrails. First and last steps. Where the edge of each step  is. Use tools that help you move around (mobility aids) if they are needed. These include: Canes. Walkers. Scooters. Crutches. Turn on the lights when you go into a dark area. Replace any light bulbs as soon as they burn out. Set up your furniture so you have a clear path. Avoid moving your furniture around. If any of your floors are uneven, fix them. If there are any pets around you, be aware of where they are. Review your medicines with your doctor. Some medicines can make you feel dizzy. This can increase your chance of falling. Ask your doctor what other things that you can do to help prevent falls. This information is not intended to replace advice given to you by your health care provider. Make sure you discuss any questions you have with your health care provider. Document Released: 11/29/2008 Document Revised: 07/11/2015 Document Reviewed: 03/09/2014 Elsevier Interactive Patient Education  2017 [employer].

## 2021-10-29 NOTE — Progress Notes (Signed)
Virtual Visit via Telephone Note  I connected with  Regina Knox on 10/29/21 at  9:45 AM EDT by telephone and verified that I am speaking with the correct person using two identifiers.  Location: Patient: home Provider: [facility] Persons participating in the virtual visit: patient/Nurse Health Advisor   I discussed the limitations, risks, security and privacy concerns of performing an evaluation and management service by telephone and the availability of in person appointments. The patient expressed understanding and agreed to proceed.  Interactive audio and video telecommunications were attempted between this nurse and patient, however failed, due to patient having technical difficulties OR patient did not have access to video capability.  We continued and completed visit with audio only.  Some vital signs may be absent or patient reported.   Dionisio David, LPN  Subjective:   Regina Knox is a 76 y.o. female who presents for Medicare Annual (Subsequent) preventive examination.  Review of Systems     Cardiac Risk Factors include: advanced age (>70mn, >>17women);hypertension     Objective:    There were no vitals filed for this visit. There is no height or weight on file to calculate BMI.     10/29/2021    9:44 AM 10/23/2021   12:23 PM 10/16/2021    3:21 PM 10/09/2021   11:07 AM 09/17/2021    8:14 AM 10/28/2020    9:55 AM 05/09/2019    8:42 AM  Advanced Directives  Does Patient Have a Medical Advance Directive? No No No No No No No  Would patient like information on creating a medical advance directive? No - Patient declined No - Patient declined No - Patient declined No - Patient declined No - Patient declined No - Patient declined No - Patient declined    Current Medications (verified) Outpatient Encounter Medications as of 10/29/2021  Medication Sig   capecitabine (XELODA) 500 MG tablet Take 3 tablets (1,500 mg total) by mouth 2 (two) times daily after a meal. Take  Monday-Friday. Take only on days of radiation.   diphenoxylate-atropine (LOMOTIL) 2.5-0.025 MG tablet Take 1 tablet by mouth 4 (four) times daily as needed for diarrhea or loose stools.   fluticasone (FLONASE) 50 MCG/ACT nasal spray Place 2 sprays into both nostrils daily. (Patient taking differently: Place 2 sprays into both nostrils daily as needed.)   Fluticasone Furoate (ARNUITY ELLIPTA) 100 MCG/ACT AEPB Inhale 1 Dose into the lungs daily. Take one inhalation orally daily.   Iron, Ferrous Sulfate, 325 (65 Fe) MG TABS Take 325 mg by mouth daily.   olmesartan-hydrochlorothiazide (BENICAR HCT) 40-25 MG tablet TAKE ONE (1) TABLET EACH DAY (Patient taking differently: TAKE ONE (1/2) TABLET EACH DAY)   prochlorperazine (COMPAZINE) 10 MG tablet Take 1 tablet (10 mg total) by mouth every 6 (six) hours as needed for nausea or vomiting.   Vitamin D, Ergocalciferol, (DRISDOL) 1.25 MG (50000 UNIT) CAPS capsule Take 1 capsule (50,000 Units total) by mouth every 7 (seven) days.   No facility-administered encounter medications on file as of 10/29/2021.    Allergies (verified) Ibuprofen   History: Past Medical History:  Diagnosis Date   Allergy    Anemia    Asthma    Elevated cholesterol    GERD (gastroesophageal reflux disease)    Hypertension    Past Surgical History:  Procedure Laterality Date   BREAST SURGERY  80's   right breast   ORIF ANKLE FRACTURE Right 11/06/2013   Procedure: OPEN REDUCTION INTERNAL FIXATION (ORIF) ANKLE FRACTURE;  Surgeon: Sanjuana Kava, MD;  Location: [facility];  Service: Orthopedics;  Laterality: Right;   TUBAL LIGATION     Family History  Problem Relation Age of Onset   Hypertension Mother    Diabetes Mother    Hypertension Father    Heart disease Father    Cancer Sister        brain, breast   Hypertension Sister    Diabetes Sister    Hypertension Sister    Heart disease Brother        congestive heart failure   Diabetes Maternal Grandmother    Diabetes  Paternal Grandmother    Hypertension Son    Diabetes Son    Stomach cancer Neg Hx    Esophageal cancer Neg Hx    Colon cancer Neg Hx    Social History   Socioeconomic History   Marital status: Widowed    Spouse name: Jori Moll   Number of children: 1   Years of education: 12   Highest education level: 12th grade  Occupational History   Occupation: Retired  Tobacco Use   Smoking status: Never   Smokeless tobacco: Never  Vaping Use   Vaping Use: Never used  Substance and Sexual Activity   Alcohol use: No   Drug use: No   Sexual activity: Not Currently  Other Topics Concern   Not on file  Social History Narrative   Husband passed 2021   Son lives in [place] Determinants of Health   Financial Resource Strain: Low Risk  (10/29/2021)   Overall Financial Resource Strain (CARDIA)    Difficulty of Paying Living Expenses: Not hard at all  Food Insecurity: No Food Insecurity (10/29/2021)   Hunger Vital Sign    Worried About Running Out of Food in the Last Year: Never true    Liberty in the Last Year: Never true  Transportation Needs: No Transportation Needs (10/29/2021)   PRAPARE - Hydrologist (Medical): No    Lack of Transportation (Non-Medical): No  Physical Activity: Inactive (10/29/2021)   Exercise Vital Sign    Days of Exercise per Week: 0 days    Minutes of Exercise per Session: 0 min  Stress: Stress Concern Present (10/29/2021)   [place]    Feeling of Stress : To some extent  Social Connections: Moderately Integrated (10/29/2021)   Social Connection and Isolation Panel [NHANES]    Frequency of Communication with Friends and Family: More than three times a week    Frequency of Social Gatherings with Friends and Family: Twice a week    Attends Religious Services: More than 4 times per year    Active Member of [employer] or Organizations: Yes    Attends Theatre manager Meetings: More than 4 times per year    Marital Status: Widowed    Tobacco Counseling Counseling given: Not Answered   Clinical Intake:  Pre-visit preparation completed: Yes  Pain : No/denies pain     Diabetes: No  How often do you need to have someone help you when you read instructions, pamphlets, or other written materials from your doctor or pharmacy?: 1 - Never  Diabetic?no  Interpreter Needed?: No  Information entered by :: Kirke Shaggy, LPN   Activities of Daily Living    10/29/2021    9:45 AM 10/28/2021   10:57 AM  In your present state of health, do you have any difficulty  performing the following activities:  Hearing? 0 0  Vision? 0 0  Difficulty concentrating or making decisions? 0 0  Walking or climbing stairs? 1 0  Dressing or bathing? 0 0  Doing errands, shopping? 1 1  Preparing Food and eating ? N Y  Using the Toilet? N Y  In the past six months, have you accidently leaked urine? Y N  Do you have problems with loss of bowel control? N Y  Managing your Medications? N N  Managing your Finances? N N  Housekeeping or managing your Housekeeping? Tempie Donning    Patient Care Team: Chevis Pretty, FNP as PCP - General (Nurse Practitioner) Derek Jack, MD as Medical Oncologist (Medical Oncology) Brien Mates, RN as Oncology Nurse Navigator (Medical Oncology)  Indicate any recent Medical Services you may have received from other than [facility] providers in the past year (date may be approximate).     Assessment:   This is a routine wellness examination for [facility].  Hearing/Vision screen Hearing Screening - Comments:: No aids Vision Screening - Comments:: Wears glasses-   Dietary issues and exercise activities discussed: Current Exercise Habits: The patient does not participate in regular exercise at present   Goals Addressed             This Visit's Progress    DIET - EAT MORE FRUITS AND VEGETABLES         Depression  Screen    10/29/2021    9:42 AM 08/25/2021   11:55 AM 06/24/2021    2:28 PM 02/25/2021   10:16 AM 10/28/2020    9:54 AM 08/22/2020   12:19 PM 05/09/2019    8:45 AM  PHQ 2/9 Scores  PHQ - 2 Score 2 0 0 0 0 0 0  PHQ- 9 Score 3 0 6 0  0     Fall Risk    10/29/2021    9:44 AM 10/28/2021   10:57 AM 08/25/2021   11:55 AM 02/25/2021   10:16 AM 10/28/2020    9:55 AM  Fall Risk   Falls in the past year? 0 0 0 0 0  Number falls in past yr: 0 0   0  Injury with Fall? 0 0   0  Risk for fall due to : No Fall Risks    Impaired vision  Follow up Falls prevention discussed    Falls prevention discussed    FALL RISK PREVENTION PERTAINING TO THE HOME:  Any stairs in or around the home? No  If so, are there any without handrails? No  Home free of loose throw rugs in walkways, pet beds, electrical cords, etc? Yes  Adequate lighting in your home to reduce risk of falls? Yes   ASSISTIVE DEVICES UTILIZED TO PREVENT FALLS:  Life alert? No  Use of a cane, walker or w/c? No  Grab bars in the bathroom? Yes  Shower chair or bench in shower? Yes  Elevated toilet seat or a handicapped toilet? Yes   Cognitive Function:        10/29/2021    9:46 AM 05/09/2019    8:46 AM  6CIT Screen  What Year? 0 points 0 points  What month? 0 points 0 points  What time? 0 points 0 points  Count back from 20 0 points 0 points  Months in reverse 0 points 0 points  Repeat phrase 0 points 0 points  Total Score 0 points 0 points    Immunizations Immunization History  Administered Date(s) Administered  Fluad Quad(high Dose 65+) 12/19/2018, 12/26/2020   Influenza, High Dose Seasonal PF 12/02/2012, 12/11/2016, 11/24/2017   Influenza,inj,Quad PF,6+ Mos 11/29/2014, 02/14/2016   Moderna SARS-COV2 Booster Vaccination 11/04/2019, 05/27/2020   Moderna Sars-Covid-2 Vaccination 10/07/2019   Pneumococcal Conjugate-13 11/30/2014   Pneumococcal Polysaccharide-23 11/17/2010    TDAP status: Due, Education has been provided  regarding the importance of this vaccine. Advised may receive this vaccine at local pharmacy or Health Dept. Aware to provide a copy of the vaccination record if obtained from local pharmacy or Health Dept. Verbalized acceptance and understanding.  Flu Vaccine status: Up to date  Pneumococcal vaccine status: Up to date  Covid-19 vaccine status: Completed vaccines  Qualifies for Shingles Vaccine? Yes   Zostavax completed No   Shingrix Completed?: No.    Education has been provided regarding the importance of this vaccine. Patient has been advised to call insurance company to determine out of pocket expense if they have not yet received this vaccine. Advised may also receive vaccine at local pharmacy or Health Dept. Verbalized acceptance and understanding.  Screening Tests Health Maintenance  Topic Date Due   Hepatitis C Screening  Never done   DEXA SCAN  Never done   COVID-19 Vaccine (2 - Moderna risk series) 06/24/2020   INFLUENZA VACCINE  11/16/2021 (Originally 09/16/2021)   Zoster Vaccines- Shingrix (1 of 2) 11/25/2021 (Originally 08/06/1964)   MAMMOGRAM  08/26/2022 (Originally 08/05/1963)   TETANUS/TDAP  08/26/2022 (Originally 08/06/1964)   Pneumonia Vaccine 38+ Years old  Completed   HPV VACCINES  Aged Out   COLONOSCOPY (Pts 45-33yr Insurance coverage will need to be confirmed)  Discontinued    Health Maintenance  Health Maintenance Due  Topic Date Due   Hepatitis C Screening  Never done   DEXA SCAN  Never done   COVID-19 Vaccine (2 - Moderna risk series) 06/24/2020    Colorectal cancer screening: No longer required.   Mammogram status: No longer required due to age.  [facility] declined referral  Lung Cancer Screening: (Low Dose CT Chest recommended if Age 78-80years, 30 pack-year currently smoking OR have quit w/in 15years.) does not qualify.   Additional Screening:  Hepatitis C Screening: does qualify; Completed no  Vision Screening: Recommended annual ophthalmology exams  for early detection of glaucoma and other disorders of the eye. Is the patient up to date with their annual eye exam?  No  Who is the provider or what is the name of the office in which the patient attends annual eye exams? No one If pt is not established with a provider, would they like to be referred to a provider to establish care? No .   Dental Screening: Recommended annual dental exams for proper oral hygiene  Community Resource Referral / Chronic Care Management: CRR required this visit?  No   CCM required this visit?  No      Plan:     I have personally reviewed and noted the following in the patient's chart:   Medical and social history Use of alcohol, tobacco or illicit drugs  Current medications and supplements including opioid prescriptions. Patient is not currently taking opioid prescriptions. Functional ability and status Nutritional status Physical activity Advanced directives List of other physicians Hospitalizations, surgeries, and ER visits in previous 12 months Vitals Screenings to include cognitive, depression, and falls Referrals and appointments  In addition, I have reviewed and discussed with patient certain preventive protocols, quality metrics, and best practice recommendations. A written personalized care plan for preventive services as  well as general preventive health recommendations were provided to patient.     Dionisio David, LPN   9/92/[id number]   Nurse Notes: fighting cancer right now

## 2021-10-30 ENCOUNTER — Encounter (HOSPITAL_COMMUNITY): Payer: Self-pay | Admitting: Emergency Medicine

## 2021-10-30 ENCOUNTER — Inpatient Hospital Stay: Payer: Medicare HMO | Admitting: Physician Assistant

## 2021-10-30 ENCOUNTER — Other Ambulatory Visit: Payer: Self-pay

## 2021-10-30 ENCOUNTER — Inpatient Hospital Stay (HOSPITAL_COMMUNITY)
Admission: EM | Admit: 2021-10-30 | Discharge: 2021-11-26 | DRG: 393 | Disposition: A | Discharge status: Hospice | Payer: Medicare HMO | Attending: Family Medicine | Admitting: Family Medicine

## 2021-10-30 ENCOUNTER — Inpatient Hospital Stay: Payer: Medicare HMO

## 2021-10-30 DIAGNOSIS — E8809 Other disorders of plasma-protein metabolism, not elsewhere classified: Secondary | ICD-10-CM | POA: Diagnosis not present

## 2021-10-30 DIAGNOSIS — E861 Hypovolemia: Secondary | ICD-10-CM | POA: Diagnosis present

## 2021-10-30 DIAGNOSIS — Z6821 Body mass index (BMI) 21.0-21.9, adult: Secondary | ICD-10-CM

## 2021-10-30 DIAGNOSIS — N939 Abnormal uterine and vaginal bleeding, unspecified: Secondary | ICD-10-CM | POA: Diagnosis not present

## 2021-10-30 DIAGNOSIS — K6289 Other specified diseases of anus and rectum: Secondary | ICD-10-CM | POA: Diagnosis not present

## 2021-10-30 DIAGNOSIS — L89312 Pressure ulcer of right buttock, stage 2: Secondary | ICD-10-CM | POA: Diagnosis not present

## 2021-10-30 DIAGNOSIS — K520 Gastroenteritis and colitis due to radiation: Principal | ICD-10-CM | POA: Diagnosis present

## 2021-10-30 DIAGNOSIS — E785 Hyperlipidemia, unspecified: Secondary | ICD-10-CM | POA: Diagnosis present

## 2021-10-30 DIAGNOSIS — J449 Chronic obstructive pulmonary disease, unspecified: Secondary | ICD-10-CM | POA: Diagnosis present

## 2021-10-30 DIAGNOSIS — Z66 Do not resuscitate: Secondary | ICD-10-CM | POA: Diagnosis not present

## 2021-10-30 DIAGNOSIS — Z515 Encounter for palliative care: Secondary | ICD-10-CM

## 2021-10-30 DIAGNOSIS — E860 Dehydration: Secondary | ICD-10-CM | POA: Diagnosis present

## 2021-10-30 DIAGNOSIS — F32A Depression, unspecified: Secondary | ICD-10-CM | POA: Diagnosis present

## 2021-10-30 DIAGNOSIS — C189 Malignant neoplasm of colon, unspecified: Secondary | ICD-10-CM

## 2021-10-30 DIAGNOSIS — Z79899 Other long term (current) drug therapy: Secondary | ICD-10-CM

## 2021-10-30 DIAGNOSIS — I10 Essential (primary) hypertension: Secondary | ICD-10-CM | POA: Diagnosis present

## 2021-10-30 DIAGNOSIS — R64 Cachexia: Secondary | ICD-10-CM | POA: Diagnosis not present

## 2021-10-30 DIAGNOSIS — R197 Diarrhea, unspecified: Secondary | ICD-10-CM | POA: Diagnosis present

## 2021-10-30 DIAGNOSIS — E876 Hypokalemia: Secondary | ICD-10-CM | POA: Diagnosis not present

## 2021-10-30 DIAGNOSIS — C20 Malignant neoplasm of rectum: Secondary | ICD-10-CM | POA: Diagnosis present

## 2021-10-30 DIAGNOSIS — E440 Moderate protein-calorie malnutrition: Secondary | ICD-10-CM | POA: Diagnosis not present

## 2021-10-30 DIAGNOSIS — E871 Hypo-osmolality and hyponatremia: Secondary | ICD-10-CM | POA: Diagnosis not present

## 2021-10-30 DIAGNOSIS — R627 Adult failure to thrive: Secondary | ICD-10-CM | POA: Diagnosis present

## 2021-10-30 DIAGNOSIS — R000 Tachycardia, unspecified: Secondary | ICD-10-CM | POA: Diagnosis not present

## 2021-10-30 DIAGNOSIS — D630 Anemia in neoplastic disease: Secondary | ICD-10-CM | POA: Diagnosis not present

## 2021-10-30 DIAGNOSIS — J90 Pleural effusion, not elsewhere classified: Secondary | ICD-10-CM | POA: Diagnosis not present

## 2021-10-30 DIAGNOSIS — Z7189 Other specified counseling: Secondary | ICD-10-CM | POA: Diagnosis not present

## 2021-10-30 DIAGNOSIS — I959 Hypotension, unspecified: Secondary | ICD-10-CM | POA: Diagnosis not present

## 2021-10-30 DIAGNOSIS — R17 Unspecified jaundice: Secondary | ICD-10-CM | POA: Diagnosis not present

## 2021-10-30 DIAGNOSIS — E8770 Fluid overload, unspecified: Secondary | ICD-10-CM | POA: Diagnosis not present

## 2021-10-30 DIAGNOSIS — R52 Pain, unspecified: Secondary | ICD-10-CM | POA: Diagnosis not present

## 2021-10-30 DIAGNOSIS — E43 Unspecified severe protein-calorie malnutrition: Secondary | ICD-10-CM | POA: Diagnosis not present

## 2021-10-30 DIAGNOSIS — R188 Other ascites: Secondary | ICD-10-CM | POA: Diagnosis present

## 2021-10-30 DIAGNOSIS — R54 Age-related physical debility: Secondary | ICD-10-CM | POA: Diagnosis not present

## 2021-10-30 DIAGNOSIS — Z5329 Procedure and treatment not carried out because of patient's decision for other reasons: Secondary | ICD-10-CM | POA: Diagnosis not present

## 2021-10-30 DIAGNOSIS — Y842 Radiological procedure and radiotherapy as the cause of abnormal reaction of the patient, or of later complication, without mention of misadventure at the time of the procedure: Secondary | ICD-10-CM | POA: Diagnosis present

## 2021-10-30 DIAGNOSIS — R109 Unspecified abdominal pain: Secondary | ICD-10-CM | POA: Diagnosis not present

## 2021-10-30 DIAGNOSIS — D72829 Elevated white blood cell count, unspecified: Secondary | ICD-10-CM | POA: Diagnosis not present

## 2021-10-30 DIAGNOSIS — K219 Gastro-esophageal reflux disease without esophagitis: Secondary | ICD-10-CM | POA: Diagnosis present

## 2021-10-30 DIAGNOSIS — T451X5A Adverse effect of antineoplastic and immunosuppressive drugs, initial encounter: Secondary | ICD-10-CM | POA: Diagnosis present

## 2021-10-30 DIAGNOSIS — K529 Noninfective gastroenteritis and colitis, unspecified: Secondary | ICD-10-CM

## 2021-10-30 DIAGNOSIS — Z8249 Family history of ischemic heart disease and other diseases of the circulatory system: Secondary | ICD-10-CM

## 2021-10-30 DIAGNOSIS — Z7951 Long term (current) use of inhaled steroids: Secondary | ICD-10-CM

## 2021-10-30 DIAGNOSIS — Z886 Allergy status to analgesic agent status: Secondary | ICD-10-CM

## 2021-10-30 DIAGNOSIS — L8990 Pressure ulcer of unspecified site, unspecified stage: Secondary | ICD-10-CM | POA: Insufficient documentation

## 2021-10-30 LAB — COMPREHENSIVE METABOLIC PANEL
ALT: 11 U/L (ref 0–44)
AST: 13 U/L — ABNORMAL LOW (ref 15–41)
Albumin: 2.3 g/dL — ABNORMAL LOW (ref 3.5–5.0)
Alkaline Phosphatase: 48 U/L (ref 38–126)
Anion gap: 8 (ref 5–15)
BUN: 19 mg/dL (ref 8–23)
CO2: 25 mmol/L (ref 22–32)
Calcium: 7.4 mg/dL — ABNORMAL LOW (ref 8.9–10.3)
Chloride: 99 mmol/L (ref 98–111)
Creatinine, Ser: 0.71 mg/dL (ref 0.44–1.00)
GFR, Estimated: >60 mL/min (ref >60)
Glucose, Bld: 134 mg/dL — ABNORMAL HIGH (ref 70–99)
Potassium: 2.8 mmol/L — ABNORMAL LOW (ref 3.5–5.1)
Sodium: 132 mmol/L — ABNORMAL LOW (ref 135–145)
Total Bilirubin: 0.8 mg/dL (ref 0.3–1.2)
Total Protein: 4.2 g/dL — ABNORMAL LOW (ref 6.5–8.1)

## 2021-10-30 LAB — CBC
HCT: 35.5 % — ABNORMAL LOW (ref 36.0–46.0)
Hemoglobin: 12.3 g/dL (ref 12.0–15.0)
MCH: 31.2 pg (ref 26.0–34.0)
MCHC: 34.6 g/dL (ref 30.0–36.0)
MCV: 90.1 fL (ref 80.0–100.0)
Platelets: 334 10*3/uL (ref 150–400)
RBC: 3.94 MIL/uL (ref 3.87–5.11)
RDW: 18.4 % — ABNORMAL HIGH (ref 11.5–15.5)
WBC: 4.3 10*3/uL (ref 4.0–10.5)
nRBC: 0 % (ref 0.0–0.2)

## 2021-10-30 LAB — LIPASE, BLOOD: Lipase: 21 U/L (ref 11–51)

## 2021-10-30 MED ORDER — LIDOCAINE HCL (PF) 1 % IJ SOLN
INTRAMUSCULAR | Status: AC
  Filled 2021-10-30: qty 30

## 2021-10-30 MED ORDER — SODIUM CHLORIDE 0.9 % IV SOLN: INTRAVENOUS | Status: DC

## 2021-10-30 MED ORDER — ACETAMINOPHEN 650 MG RE SUPP: 650.0000 mg | Freq: Four times a day (QID) | RECTAL | Status: DC | PRN

## 2021-10-30 MED ORDER — ACETAMINOPHEN 325 MG PO TABS: 650.0000 mg | ORAL_TABLET | Freq: Four times a day (QID) | ORAL | Status: DC | PRN

## 2021-10-30 MED ORDER — OXYCODONE HCL 5 MG PO TABS
5.0000 mg | ORAL_TABLET | ORAL | Status: DC | PRN
  Administered 2021-11-23 – 2021-11-24 (×3): 5 mg via ORAL
  Filled 2021-10-30 (×3): qty 1

## 2021-10-30 MED ORDER — POTASSIUM CHLORIDE IN NACL 40-0.9 MEQ/L-% IV SOLN
INTRAVENOUS | Status: DC
  Filled 2021-10-30 (×6): qty 1000

## 2021-10-30 MED ORDER — POTASSIUM CHLORIDE 10 MEQ/100ML IV SOLN
10.0000 meq | INTRAVENOUS | Status: AC
  Administered 2021-10-30 (×4): 10 meq via INTRAVENOUS
  Filled 2021-10-30 (×4): qty 100

## 2021-10-30 MED ORDER — HEPARIN SODIUM (PORCINE) 5000 UNIT/ML IJ SOLN
5000.0000 [IU] | Freq: Three times a day (TID) | INTRAMUSCULAR | Status: DC
  Administered 2021-11-20: 5000 [IU] via SUBCUTANEOUS
  Filled 2021-10-30 (×21): qty 1

## 2021-10-30 MED ORDER — DIPHENOXYLATE-ATROPINE 2.5-0.025 MG PO TABS
1.0000 | ORAL_TABLET | Freq: Four times a day (QID) | ORAL | Status: DC | PRN
  Administered 2021-10-31 – 2021-11-02 (×5): 1 via ORAL
  Filled 2021-10-30 (×5): qty 1

## 2021-10-30 MED ORDER — ONDANSETRON HCL 4 MG PO TABS
4.0000 mg | ORAL_TABLET | Freq: Four times a day (QID) | ORAL | Status: DC | PRN
  Administered 2021-11-06: 4 mg via ORAL
  Filled 2021-10-30: qty 1

## 2021-10-30 MED ORDER — SODIUM CHLORIDE 0.9 % IV BOLUS
1000.0000 mL | Freq: Once | INTRAVENOUS | Status: AC
  Administered 2021-10-30: 1000 mL via INTRAVENOUS

## 2021-10-30 MED ORDER — LIDOCAINE-EPINEPHRINE (PF) 2 %-1:200000 IJ SOLN
INTRAMUSCULAR | Status: AC
  Filled 2021-10-30: qty 20

## 2021-10-30 MED ORDER — ONDANSETRON HCL 4 MG/2ML IJ SOLN: 4.0000 mg | Freq: Four times a day (QID) | INTRAMUSCULAR | Status: DC | PRN

## 2021-10-30 NOTE — ED Provider Notes (Signed)
[facility] EMERGENCY DEPARTMENT Provider Note   CSN: [id number] Arrival date & time: 10/30/21  1116     History  Chief Complaint  Patient presents with   Diarrhea    Regina Knox is a 76 y.o. female.  Pt reports she has colon cancer.  Pt currently receiving chemo and radiation.  Pt reports she has had diarrhea for the past 2 weeks.  Pt has had Iv fluids at cancer center for weakness and dehydration from the same.  Pt called Oncology and was advised to come to ED for Iv fluids and evaluation.  Pt has been taking imodium and lomotil without relief.    The history is provided by the patient and a relative. No language interpreter was used.  Diarrhea Quality:  Watery Severity:  Severe Onset quality:  Gradual Number of episodes:  Multiple Duration:  2 weeks Timing:  Constant Progression:  Worsening Relieved by:  Nothing Worsened by:  Nothing Ineffective treatments:  Anti-motility medications and liquids Associated symptoms: vomiting   Associated symptoms: no abdominal pain and no fever   Risk factors: no recent antibiotic use, no sick contacts, no suspicious food intake and no travel to endemic areas        Home Medications Prior to Admission medications   Medication Sig Start Date End Date Taking? Authorizing Provider  capecitabine (XELODA) 500 MG tablet Take 3 tablets (1,500 mg total) by mouth 2 (two) times daily after a meal. Take Monday-Friday. Take only on days of radiation. 09/17/21  Yes Derek Jack, MD  diphenoxylate-atropine (LOMOTIL) 2.5-0.025 MG tablet Take 1 tablet by mouth 4 (four) times daily as needed for diarrhea or loose stools. 10/21/21  Yes Derek Jack, MD  fluticasone (FLONASE) 50 MCG/ACT nasal spray Place 2 sprays into both nostrils daily. Patient taking differently: Place 2 sprays into both nostrils daily as needed for allergies. 02/25/21  Yes Martin, Mary-Margaret, FNP  Fluticasone Furoate (ARNUITY ELLIPTA) 100 MCG/ACT AEPB Inhale 1 Dose  into the lungs daily. Take one inhalation orally daily. 08/25/21  Yes Martin, Mary-Margaret, FNP  Iron, Ferrous Sulfate, 325 (65 Fe) MG TABS Take 325 mg by mouth daily. 08/25/21  Yes Martin, Mary-Margaret, FNP  olmesartan-hydrochlorothiazide (BENICAR HCT) 40-25 MG tablet TAKE ONE (1) TABLET EACH DAY Patient taking differently: Take 0.5 tablets by mouth daily. 08/25/21  Yes Hassell Done, Mary-Margaret, FNP  prochlorperazine (COMPAZINE) 10 MG tablet Take 1 tablet (10 mg total) by mouth every 6 (six) hours as needed for nausea or vomiting. 09/18/21  Yes Derek Jack, MD  Vitamin D, Ergocalciferol, (DRISDOL) 1.25 MG (50000 UNIT) CAPS capsule Take 1 capsule (50,000 Units total) by mouth every 7 (seven) days. 08/25/21  Yes Hassell Done, Mary-Margaret, FNP      Allergies    Ibuprofen    Review of Systems   Review of Systems  Constitutional:  Positive for fatigue. Negative for fever.  Respiratory:  Negative for chest tightness.   Gastrointestinal:  Positive for diarrhea and vomiting. Negative for abdominal pain.  Neurological:  Positive for weakness.  All other systems reviewed and are negative.   Physical Exam Updated Vital Signs BP 108/64   Pulse (!) 106   Temp 98.1 F (36.7 C) (Oral)   Resp 18   Ht '5\' 3"'$  (1.6 m)   Wt 54.4 kg   LMP 05/25/1996   SpO2 98%   BMI 21.26 kg/m  Physical Exam Vitals and nursing note reviewed.  Constitutional:      Appearance: She is well-developed.  HENT:  Head: Normocephalic.     Mouth/Throat:     Mouth: Mucous membranes are moist.  Eyes:     Extraocular Movements: Extraocular movements intact.     Pupils: Pupils are equal, round, and reactive to light.  Cardiovascular:     Rate and Rhythm: Tachycardia present.  Pulmonary:     Effort: Pulmonary effort is normal.  Abdominal:     General: Abdomen is flat. There is no distension.  Musculoskeletal:        General: Normal range of motion.     Cervical back: Normal range of motion.  Skin:    General: Skin  is warm.  Neurological:     General: No focal deficit present.     Mental Status: She is alert and oriented to person, place, and time.  Psychiatric:        Mood and Affect: Mood normal.     ED Results / Procedures / Treatments   Labs (all labs ordered are listed, but only abnormal results are displayed) Labs Reviewed  COMPREHENSIVE METABOLIC PANEL - Abnormal; Notable for the following components:      Result Value   Sodium 132 (*)    Potassium 2.8 (*)    Glucose, Bld 134 (*)    Calcium 7.4 (*)    Total Protein 4.2 (*)    Albumin 2.3 (*)    AST 13 (*)    All other components within normal limits  CBC - Abnormal; Notable for the following components:   HCT 35.5 (*)    RDW 18.4 (*)    All other components within normal limits  GASTROINTESTINAL PANEL BY PCR, STOOL (REPLACES STOOL CULTURE)  LIPASE, BLOOD  URINALYSIS, ROUTINE W REFLEX MICROSCOPIC    EKG None  Radiology No results found.  Procedures Procedures    Medications Ordered in ED Medications  potassium chloride 10 mEq in 100 mL IVPB (10 mEq Intravenous New Bag/Given 10/30/21 1844)  sodium chloride 0.9 % bolus 1,000 mL (0 mLs Intravenous Stopped 10/30/21 1707)    ED Course/ Medical Decision Making/ A&P                           Medical Decision Making Pt complains of dairrhea  Amount and/or Complexity of Data Reviewed Independent Historian:     Details: Family reports pt becoming weaker every day External Data Reviewed: notes.    Details: Oncology notes reviewed  Labs: ordered. Decision-making details documented in ED Course.    Details: Labs ordered reviewed and interpreted.  Potassium is 2.8.   Discussion of management or test interpretation with external provider(s): Hospitalitis consulted   Risk Prescription drug management. Decision regarding hospitalization. Risk Details: Pt given IV fluids x 2 liters.  Iv potassium.   Pt continued to have diarrhea.  Pt remains tachycardic.              Final Clinical Impression(s) / ED Diagnoses Final diagnoses:  Diarrhea, unspecified type  Dehydration  Malignant neoplasm of colon, unspecified part of colon ([place])  Hypokalemia    Rx / DC Orders ED Discharge Orders     None         Sidney Ace 10/30/21 2158    Milton Ferguson, MD 11/02/21 1013

## 2021-10-30 NOTE — ED Notes (Signed)
Pt up to bedside commode

## 2021-10-30 NOTE — ED Triage Notes (Signed)
Pt sent from Cancer for evaluation of weakness and diarrhea, per pt she has had diarrhea x 2 weeks every since starting cancer treatments.

## 2021-10-31 DIAGNOSIS — E440 Moderate protein-calorie malnutrition: Secondary | ICD-10-CM

## 2021-10-31 DIAGNOSIS — I10 Essential (primary) hypertension: Secondary | ICD-10-CM | POA: Diagnosis not present

## 2021-10-31 DIAGNOSIS — R197 Diarrhea, unspecified: Secondary | ICD-10-CM

## 2021-10-31 DIAGNOSIS — E876 Hypokalemia: Secondary | ICD-10-CM | POA: Diagnosis not present

## 2021-10-31 LAB — CBC WITH DIFFERENTIAL/PLATELET
Abs Immature Granulocytes: 0.07 10*3/uL (ref 0.00–0.07)
Basophils Absolute: 0 10*3/uL (ref 0.0–0.1)
Basophils Relative: 1 %
Eosinophils Absolute: 0.1 10*3/uL (ref 0.0–0.5)
Eosinophils Relative: 1 %
HCT: 32.2 % — ABNORMAL LOW (ref 36.0–46.0)
Hemoglobin: 10.8 g/dL — ABNORMAL LOW (ref 12.0–15.0)
Immature Granulocytes: 2 %
Lymphocytes Relative: 8 %
Lymphs Abs: 0.3 10*3/uL — ABNORMAL LOW (ref 0.7–4.0)
MCH: 31.6 pg (ref 26.0–34.0)
MCHC: 33.5 g/dL (ref 30.0–36.0)
MCV: 94.2 fL (ref 80.0–100.0)
Monocytes Absolute: 0.4 10*3/uL (ref 0.1–1.0)
Monocytes Relative: 10 %
Neutro Abs: 3.4 10*3/uL (ref 1.7–7.7)
Neutrophils Relative %: 78 %
Platelets: 293 10*3/uL (ref 150–400)
RBC: 3.42 MIL/uL — ABNORMAL LOW (ref 3.87–5.11)
RDW: 18.7 % — ABNORMAL HIGH (ref 11.5–15.5)
WBC: 4.3 10*3/uL (ref 4.0–10.5)
nRBC: 0 % (ref 0.0–0.2)

## 2021-10-31 LAB — COMPREHENSIVE METABOLIC PANEL
ALT: 10 U/L (ref 0–44)
AST: 14 U/L — ABNORMAL LOW (ref 15–41)
Albumin: 2.3 g/dL — ABNORMAL LOW (ref 3.5–5.0)
Alkaline Phosphatase: 47 U/L (ref 38–126)
Anion gap: 6 (ref 5–15)
BUN: 17 mg/dL (ref 8–23)
CO2: 23 mmol/L (ref 22–32)
Calcium: 7.2 mg/dL — ABNORMAL LOW (ref 8.9–10.3)
Chloride: 106 mmol/L (ref 98–111)
Creatinine, Ser: 0.66 mg/dL (ref 0.44–1.00)
GFR, Estimated: >60 mL/min (ref >60)
Glucose, Bld: 118 mg/dL — ABNORMAL HIGH (ref 70–99)
Potassium: 3.3 mmol/L — ABNORMAL LOW (ref 3.5–5.1)
Sodium: 135 mmol/L (ref 135–145)
Total Bilirubin: 0.9 mg/dL (ref 0.3–1.2)
Total Protein: 4.2 g/dL — ABNORMAL LOW (ref 6.5–8.1)

## 2021-10-31 LAB — C DIFFICILE QUICK SCREEN W PCR REFLEX
C Diff antigen: NEGATIVE
C Diff interpretation: NOT DETECTED
C Diff toxin: NEGATIVE

## 2021-10-31 LAB — MAGNESIUM: Magnesium: 1.9 mg/dL (ref 1.7–2.4)

## 2021-10-31 MED ORDER — LACTATED RINGERS IV SOLN: INTRAVENOUS | Status: DC

## 2021-10-31 MED ORDER — CALCIUM CARBONATE ANTACID 500 MG PO CHEW
1.0000 | CHEWABLE_TABLET | Freq: Three times a day (TID) | ORAL | Status: DC | PRN
  Administered 2021-10-31 – 2021-11-03 (×2): 200 mg via ORAL
  Filled 2021-10-31 (×2): qty 1

## 2021-10-31 NOTE — Assessment & Plan Note (Signed)
-   Diarrhea 7-8 times per day - Likely the etiology for the hypokalemia - Had IV fluids outpatient, and Imodium and Lomotil - Patient continues to have diarrhea and feels dehydrated - Continue IV hydration - Continue Lomotil and Imodium - Continue to monitor

## 2021-10-31 NOTE — Consult Note (Signed)
Patient is a 76 year old female with stage III rectal cancer undergoing concurrent chemotherapy with oral Xeloda along with daily XRT.  She was admitted for increasing weakness and fatigue and intractable diarrhea.  She is contemplating discontinuing treatment altogether given the significant side effects she has had.  After lengthy discussion with the patient she agreed it was best not to make this decision when she feels so terrible.  When she is discharged from the hospital she has been instructed to keep her follow-up appointment on November 05, 2021 at the cancer center at which point she can have further discussions with her treatment team and decide what is the best course of action.

## 2021-10-31 NOTE — Assessment & Plan Note (Signed)
-   Albumin 2.3 - Secondary to poor p.o. intake which is due to poor appetite - Currently n.p.o. in the setting of intractable diarrhea, but when patient is able to tolerate diet, encourage nutrient dense food choices

## 2021-10-31 NOTE — Assessment & Plan Note (Signed)
-   Potassium 2.8 - 40 mEq replaced in the ED - Continue normal saline with 40 mEq of potassium throughout the night - Trend in the a.m.

## 2021-10-31 NOTE — H&P (Signed)
History and Physical    Patient: Regina Knox QAS:[id number] DOB: Sep 14, 1945 DOA: 10/30/2021 DOS: the patient was seen and examined on 10/31/2021 PCP: Chevis Pretty, FNP  Patient coming from: Home  Chief Complaint:  Chief Complaint  Patient presents with   Diarrhea   HPI: Regina Knox is a 76 y.o. female with medical history significant of anemia, asthma, hyperlipidemia, GERD, hypertension, colorectal cancer currently getting radiation, and more presents to the ED with a chief complaint of intractable diarrhea.  Patient reports that she has had diarrhea for 2 weeks.  Its associated with the radiation, which she started 3 weeks ago for colorectal cancer.  Patient reports that since that time she has had 7-8 watery bowel movements per day.  They are nonbloody.  She does have associated abdominal cramping that is relieved after she has a bowel movement.  Patient reports that anything she eats or drinks seems to go straight through her and she has diarrhea immediately after.  She reports it is even noticeable with the IV fluids.  She has taken Lomotil and Imodium with no relief at home.  She had nausea and vomiting x1 that was nonbloody emesis.  Her last normal meal was day prior to presentation.  She has had a decreased appetite.  Patient does report some dysuria that she attributes to dehydration.  She had some generalized weakness which has been expected with radiation.  Patient has no other complaints at this time.  Patient does not smoke, does not drink, does not use illicit drugs.  She is vaccinated for COVID.  Patient is full code. Review of Systems: As mentioned in the history of present illness. All other systems reviewed and are negative. Past Medical History:  Diagnosis Date   Allergy    Anemia    Asthma    Elevated cholesterol    GERD (gastroesophageal reflux disease)    Hypertension    Past Surgical History:  Procedure Laterality Date   BREAST SURGERY  80's   right  breast   ORIF ANKLE FRACTURE Right 11/06/2013   Procedure: OPEN REDUCTION INTERNAL FIXATION (ORIF) ANKLE FRACTURE;  Surgeon: Sanjuana Kava, MD;  Location: [facility];  Service: Orthopedics;  Laterality: Right;   TUBAL LIGATION     Social History:  reports that she has never smoked. She has never used smokeless tobacco. She reports that she does not drink alcohol and does not use drugs.  Allergies  Allergen Reactions   Ibuprofen Rash    Family History  Problem Relation Age of Onset   Hypertension Mother    Diabetes Mother    Hypertension Father    Heart disease Father    Cancer Sister        brain, breast   Hypertension Sister    Diabetes Sister    Hypertension Sister    Heart disease Brother        congestive heart failure   Diabetes Maternal Grandmother    Diabetes Paternal Grandmother    Hypertension Son    Diabetes Son    Stomach cancer Neg Hx    Esophageal cancer Neg Hx    Colon cancer Neg Hx     Prior to Admission medications   Medication Sig Start Date End Date Taking? Authorizing Provider  capecitabine (XELODA) 500 MG tablet Take 3 tablets (1,500 mg total) by mouth 2 (two) times daily after a meal. Take Monday-Friday. Take only on days of radiation. 09/17/21  Yes Derek Jack, MD  diphenoxylate-atropine (LOMOTIL) 2.5-0.025 MG  tablet Take 1 tablet by mouth 4 (four) times daily as needed for diarrhea or loose stools. 10/21/21  Yes Derek Jack, MD  fluticasone (FLONASE) 50 MCG/ACT nasal spray Place 2 sprays into both nostrils daily. Patient taking differently: Place 2 sprays into both nostrils daily as needed for allergies. 02/25/21  Yes Martin, Mary-Margaret, FNP  Fluticasone Furoate (ARNUITY ELLIPTA) 100 MCG/ACT AEPB Inhale 1 Dose into the lungs daily. Take one inhalation orally daily. 08/25/21  Yes Martin, Mary-Margaret, FNP  Iron, Ferrous Sulfate, 325 (65 Fe) MG TABS Take 325 mg by mouth daily. 08/25/21  Yes Martin, Mary-Margaret, FNP   olmesartan-hydrochlorothiazide (BENICAR HCT) 40-25 MG tablet TAKE ONE (1) TABLET EACH DAY Patient taking differently: Take 0.5 tablets by mouth daily. 08/25/21  Yes Hassell Done, Mary-Margaret, FNP  prochlorperazine (COMPAZINE) 10 MG tablet Take 1 tablet (10 mg total) by mouth every 6 (six) hours as needed for nausea or vomiting. 09/18/21  Yes Derek Jack, MD  Vitamin D, Ergocalciferol, (DRISDOL) 1.25 MG (50000 UNIT) CAPS capsule Take 1 capsule (50,000 Units total) by mouth every 7 (seven) days. 08/25/21  Yes Chevis Pretty, FNP    Physical Exam: Vitals:   10/30/21 2040 10/31/21 0000 10/31/21 0001 10/31/21 0119  BP: (!) 111/58 105/60 105/60   Pulse: 89 97 97   Resp: '15 18 18   '$ Temp:    98.2 F (36.8 C)  TempSrc:    Oral  SpO2: 99% 98% 97%   Weight:      Height:       1.  General: Patient lying supine in bed, gets up to bedside commode without assistance   2. Psychiatric: Alert and oriented x 3, mood and behavior normal for situation, pleasant and cooperative with exam   3. Neurologic: Speech and language are normal, face is symmetric, moves all 4 extremities voluntarily, at baseline without acute deficits on limited exam   4. HEENMT:  Head is atraumatic, normocephalic, pupils reactive to light, neck is supple, trachea is midline, mucous membranes are moist   5. Respiratory : Lungs are clear to auscultation bilaterally without wheezing, rhonchi, rales, no cyanosis, no increase in work of breathing or accessory muscle use   6. Cardiovascular : Heart rate normal, rhythm is regular, no murmurs, rubs or gallops, no peripheral edema, peripheral pulses palpated   7. Gastrointestinal:  Abdomen is soft, nondistended, nontender to palpation bowel sounds active, no masses or organomegaly palpated   8. Skin:  Skin is warm, dry and intact without rashes, acute lesions, or ulcers on limited exam   9.Musculoskeletal:  No acute deformities or trauma, no asymmetry in tone, no  peripheral edema, peripheral pulses palpated, no tenderness to palpation in the extremities  Data Reviewed: In the ED Temp 98, heart rate 106-109, respiratory rate 16-18, blood pressure 108/64-129/82 Borderline leukopenia with white blood cell count of 4.3, hemoglobin 12.3, platelets 334 Slight hyponatremia at 132, significant hypokalemia at 2.8 Glucose 134 Albumin 2.3 EKG shows sinus tach, heart rate 99, QTc 416 Normal saline 1 L given in the ED and 40 mEq of potassium Admission requested for intractable diarrhea with electrolyte disturbance  Assessment and Plan: * Intractable diarrhea - Diarrhea 7-8 times per day - Likely the etiology for the hypokalemia - Had IV fluids outpatient, and Imodium and Lomotil - Patient continues to have diarrhea and feels dehydrated - Continue IV hydration - Continue Lomotil and Imodium - Continue to monitor  Hypocalcemia - Corrects for hypoalbuminemia to 8.8 - Trend in the a.m.  Protein-calorie malnutrition,  moderate ([facility]) - Albumin 2.3 - Secondary to poor p.o. intake which is due to poor appetite - Currently n.p.o. in the setting of intractable diarrhea, but when patient is able to tolerate diet, encourage nutrient dense food choices  Hypokalemia - Potassium 2.8 - 40 mEq replaced in the ED - Continue normal saline with 40 mEq of potassium throughout the night - Trend in the a.m.  Benign essential hypertension - Holding olmesartan and hydrochlorothiazide in the setting of soft pressures and hypovolemia -Pressures as low as 108/64 prior to admission -Continue to monitor      Advance Care Planning:   Code Status: Full Code   Consults: Oncology-patient concerned that this hospitalization will interfere with her treatment  Family Communication: No family at bedside  Severity of Illness: The appropriate patient status for this patient is INPATIENT. Inpatient status is judged to be reasonable and necessary in order to provide the  required intensity of service to ensure the patient's safety. The patient's presenting symptoms, physical exam findings, and initial radiographic and laboratory data in the context of their chronic comorbidities is felt to place them at high risk for further clinical deterioration. Furthermore, it is not anticipated that the patient will be medically stable for discharge from the hospital within 2 midnights of admission.   * I certify that at the point of admission it is my clinical judgment that the patient will require inpatient hospital care spanning beyond 2 midnights from the point of admission due to high intensity of service, high risk for further deterioration and high frequency of surveillance required.*  Author: Rolla Plate, DO 10/31/2021 3:26 AM  For on call review [email or web address].

## 2021-10-31 NOTE — Assessment & Plan Note (Signed)
-   Holding olmesartan and hydrochlorothiazide in the setting of soft pressures and hypovolemia -Pressures as low as 108/64 prior to admission -Continue to monitor

## 2021-10-31 NOTE — Progress Notes (Signed)
   Patient seen and examined at bedside, patient admitted after midnight, please see earlier detailed admission note by [place] B Zierle-Ghosh, DO. Briefly, patient presented secondary to intractable diarrhea. Patient started on IV fluids for supportive care.  Subjective: Patient reports very frequent diarrhea. No significant abdominal pain. No current nausea/vomiting but had some emesis the day prior.  BP 124/66 (BP Location: Right Arm)   Pulse 95   Temp 97.8 F (36.6 C) (Oral)   Resp 20   Ht '5\' 3"'$  (1.6 m)   Wt 54.4 kg   LMP 05/25/1996   SpO2 100%   BMI 21.26 kg/m   General exam: Appears calm and comfortable Respiratory system: Clear to auscultation. Respiratory effort normal. Cardiovascular system: S1 & S2 heard, RRR. No murmurs, rubs, gallops or clicks. Gastrointestinal system: Abdomen is nondistended, soft and nontender. No organomegaly or masses felt. Normal bowel sounds heard. Central nervous system: Alert and oriented. No focal neurological deficits. Musculoskeletal: No edema. No calf tenderness Skin: No cyanosis. No rashes Psychiatry: Judgement and insight appear normal. Mood & affect appropriate.   Brief assessment/Plan:  Intractable diarrhea Likely secondary to radiation therapy and resultant radiation enteritis. Afebrile. No leukocytosis or leukopenia. No abdominal pain. C. Difficile tested and is negative. Emesis resolved. Supportive care -Continue IV fluids -Advance diet as tolerated  Rectal cancer Patient is on Xeloda and receiving radiation treatment. Patient is considering stopping treatments as her quality of life is suffering; she is hoping to speak with medical oncology. Her primary oncologist is not available, unfortunately. On-call oncology contacted for consult.  Hypocalcemia Falsely low, secondary to hypoalbuminemia. Corrected calcium (9/15) of 8.6 which is within normal limits.  Moderate malnutrition Unsure of diagnosis. No prior history noted. Nutrition  management complicated by cancer diagnosis and treatments with resultant adverse effects. -Dietitian consult  Hypokalemia Likely secondary to diarrheal illness. Supplementation given. -Continue potassium supplementation as needed  Primary hypertension Patient is on olmesartan-hydrochlorothiazide as an outpatient which were held on admission secondary to soft blood pressure and hypovolemia. Blood pressure is currently normotensive.   Family communication: None at bedside DVT prophylaxis: Heparin subq Disposition: Discharge home likely in several days pending improvement in diarrhea output   Cordelia Poche, MD [facility] 10/31/2021, 1:04 PM

## 2021-10-31 NOTE — Assessment & Plan Note (Signed)
-   Corrects for hypoalbuminemia to 8.8 - Trend in the a.m.

## 2021-10-31 NOTE — Progress Notes (Signed)
  Transition of Care [facility]) Screening Note   Patient Details  Name: Regina Knox Date of Birth: 11/13/1945   Transition of Care [facility]) CM/SW Contact:    Iona Beard, [place] Phone Number: 10/31/2021, 11:44 AM    Transition of Care Department [facility]) has reviewed patient and no TOC needs have been identified at this time. We will continue to monitor patient advancement through interdisciplinary progression rounds. If new patient transition needs arise, please place a TOC consult.

## 2021-10-31 NOTE — Care Management Important Message (Signed)
Important Message  Patient Details  Name: Regina Knox MRN: [id number] Date of Birth: 16-May-1945   Medicare Important Message Given:  Yes (spoke with by phone due to contact precautions at [phone], no additonal copy needed)     Tommy Medal 10/31/2021, 12:02 PM

## 2021-11-01 DIAGNOSIS — R197 Diarrhea, unspecified: Secondary | ICD-10-CM | POA: Diagnosis not present

## 2021-11-01 DIAGNOSIS — I10 Essential (primary) hypertension: Secondary | ICD-10-CM | POA: Diagnosis not present

## 2021-11-01 DIAGNOSIS — E876 Hypokalemia: Secondary | ICD-10-CM | POA: Diagnosis not present

## 2021-11-01 DIAGNOSIS — E440 Moderate protein-calorie malnutrition: Secondary | ICD-10-CM | POA: Diagnosis not present

## 2021-11-01 LAB — BASIC METABOLIC PANEL
Anion gap: 8 (ref 5–15)
BUN: 13 mg/dL (ref 8–23)
CO2: 20 mmol/L — ABNORMAL LOW (ref 22–32)
Calcium: 7.4 mg/dL — ABNORMAL LOW (ref 8.9–10.3)
Chloride: 107 mmol/L (ref 98–111)
Creatinine, Ser: 0.58 mg/dL (ref 0.44–1.00)
GFR, Estimated: >60 mL/min (ref >60)
Glucose, Bld: 103 mg/dL — ABNORMAL HIGH (ref 70–99)
Potassium: 2.9 mmol/L — ABNORMAL LOW (ref 3.5–5.1)
Sodium: 135 mmol/L (ref 135–145)

## 2021-11-01 LAB — GASTROINTESTINAL PANEL BY PCR, STOOL (REPLACES STOOL CULTURE)

## 2021-11-01 MED ORDER — ALBUTEROL SULFATE (2.5 MG/3ML) 0.083% IN NEBU: 2.5000 mg | INHALATION_SOLUTION | RESPIRATORY_TRACT | Status: DC | PRN

## 2021-11-01 MED ORDER — ALBUTEROL SULFATE (2.5 MG/3ML) 0.083% IN NEBU: 2.5000 mg | INHALATION_SOLUTION | RESPIRATORY_TRACT | Status: DC

## 2021-11-01 MED ORDER — BUDESONIDE 0.25 MG/2ML IN SUSP
0.2500 mg | Freq: Two times a day (BID) | RESPIRATORY_TRACT | Status: DC
  Administered 2021-11-01 – 2021-11-08 (×15): 0.25 mg via RESPIRATORY_TRACT
  Filled 2021-11-01 (×18): qty 2

## 2021-11-01 MED ORDER — POTASSIUM CHLORIDE CRYS ER 20 MEQ PO TBCR
40.0000 meq | EXTENDED_RELEASE_TABLET | ORAL | Status: AC
  Administered 2021-11-01 (×2): 40 meq via ORAL
  Filled 2021-11-01 (×2): qty 2

## 2021-11-01 NOTE — Progress Notes (Signed)
Pt refuse to wear telemetry monitor. MD made aware.

## 2021-11-01 NOTE — Progress Notes (Signed)
Clarified orders for telemetry MD wants her to have cardiac monitor. Patient is refusing telemetry. MD informed.

## 2021-11-01 NOTE — Progress Notes (Signed)
PROGRESS NOTE    Regina Knox  OVZ:[id number] DOB: Aug 24, 1945 DOA: 10/30/2021 PCP: Chevis Pretty, FNP   Brief Narrative: Regina Knox is a 76 y.o. female with a history of anemia, asthma, hyperlipidemia, GERD, hypertension, colorectal cancer. Patient presented secondary to intractable diarrhea likely secondary to recent radiation therapy. Supportive care.   Assessment & Plan:   Principal Problem:   Intractable diarrhea Active Problems:   Benign essential hypertension   Hypokalemia   Protein-calorie malnutrition, moderate (HCC)   Hypocalcemia   Intractable diarrhea Likely secondary to radiation therapy and resultant radiation enteritis. Afebrile. No leukocytosis or leukopenia. No abdominal pain. C. Difficile tested and is negative. Emesis resolved. Supportive care -Continue IV fluids -Advance diet as tolerated   Rectal cancer Patient is on Xeloda and receiving radiation treatment. Patient is considering stopping treatments as her quality of life is suffering; she is hoping to speak with medical oncology. Her primary oncologist is not available, unfortunately. On-call oncology contacted for consult. Patient will follow-up with oncology as an outpatient to discuss further treatment options/recommendations.   Hypocalcemia Falsely low, secondary to hypoalbuminemia. Corrected calcium (9/15) of 8.6 which is within normal limits.   Moderate malnutrition Unsure of diagnosis. No prior history noted. Nutrition management complicated by cancer diagnosis and treatments with resultant adverse effects. -Dietitian consult   Hypokalemia Likely secondary to diarrheal illness. Supplementation given. Potassium of 2.9 on BMP this morning. -Continue potassium supplementation as needed   Primary hypertension Patient is on olmesartan-hydrochlorothiazide as an outpatient which were held on admission secondary to soft blood pressure and hypovolemia. Blood pressure is currently  normotensive.   DVT prophylaxis: Heparin subq Code Status:   Code Status: Full Code Family Communication: None at bedside Disposition Plan: Discharge home once diarrhea frequency improves and oral intake improves   Consultants:  Medical oncology  Procedures:  None  Antimicrobials: None    Subjective: Patient reports some improvement of symptoms but states her symptoms worsen when she eats something. No other concerns.  Objective: Vitals:   10/31/21 1817 10/31/21 2136 11/01/21 0149 11/01/21 0502  BP: (!) 115/46 (!) 134/59 128/61 (!) 113/58  Pulse: 98 (!) 103 98 95  Resp: '20 18 16 16  '$ Temp: 98 F (36.7 C) (!) 97.1 F (36.2 C) (!) 96.9 F (36.1 C) 98.7 F (37.1 C)  TempSrc: Oral     SpO2: 100% 100% 100% 100%  Weight:      Height:        Intake/Output Summary (Last 24 hours) at 11/01/2021 1020 Last data filed at 11/01/2021 0529 Gross per 24 hour  Intake 1696.5 ml  Output --  Net 1696.5 ml   Filed Weights   10/30/21 1147  Weight: 54.4 kg    Examination:  General exam: Appears calm and comfortable Respiratory system: Respiratory effort normal. Gastrointestinal system: Abdomen is nondistended, soft and nontender. No organomegaly or masses felt. Normal bowel sounds heard. Central nervous system: Alert and oriented. No focal neurological deficits. Musculoskeletal: No edema. No calf tenderness Psychiatry: Judgement and insight appear normal. Mood & affect appropriate.     Data Reviewed: I have personally reviewed following labs and imaging studies  CBC Lab Results  Component Value Date   WBC 4.3 10/30/2021   RBC 3.42 (L) 10/30/2021   HGB 10.8 (L) 10/30/2021   HCT 32.2 (L) 10/30/2021   MCV 94.2 10/30/2021   MCH 31.6 10/30/2021   PLT 293 10/30/2021   MCHC 33.5 10/30/2021   RDW 18.7 (H) 10/30/2021   LYMPHSABS  0.3 (L) 10/30/2021   MONOABS 0.4 10/30/2021   EOSABS 0.1 10/30/2021   BASOSABS 0.0 18/29/9371     Last metabolic panel Lab Results   Component Value Date   NA 135 11/01/2021   K 2.9 (L) 11/01/2021   CL 107 11/01/2021   CO2 20 (L) 11/01/2021   BUN 13 11/01/2021   CREATININE 0.58 11/01/2021   GLUCOSE 103 (H) 11/01/2021   GFRNONAA >60 11/01/2021   GFRAA 71 04/28/2019   CALCIUM 7.4 (L) 11/01/2021   PROT 4.2 (L) 10/31/2021   ALBUMIN 2.3 (L) 10/31/2021   LABGLOB 2.4 08/25/2021   AGRATIO 1.8 08/25/2021   BILITOT 0.9 10/31/2021   ALKPHOS 47 10/31/2021   AST 14 (L) 10/31/2021   ALT 10 10/31/2021   ANIONGAP 8 11/01/2021    CBG (last 3)  No results for input(s): "GLUCAP" in the last 72 hours.   GFR: Estimated Creatinine Clearance: 47.9 mL/min (by C-G formula based on SCr of 0.58 mg/dL).  Coagulation Profile: No results for input(s): "INR", "PROTIME" in the last 168 hours.  Recent Results (from the past 240 hour(s))  C Difficile Quick Screen w PCR reflex     Status: None   Collection Time: 10/31/21  8:24 AM   Specimen: STOOL  Result Value Ref Range Status   C Diff antigen NEGATIVE NEGATIVE Final   C Diff toxin NEGATIVE NEGATIVE Final   C Diff interpretation No C. difficile detected.  Final    Comment: Performed at [facility], [address] [place], [place] [address]        Radiology Studies: No results found.      Scheduled Meds:  budesonide  0.25 mg Nebulization BID   heparin  5,000 Units Subcutaneous Q8H   potassium chloride  40 mEq Oral Q4H   Continuous Infusions:  lactated ringers 75 mL/hr at 10/31/21 1602     LOS: 2 days     Cordelia Poche, MD [facility] 11/01/2021, 10:20 AM  If 7PM-7AM, please contact night-coverage [email or web address]

## 2021-11-02 DIAGNOSIS — R197 Diarrhea, unspecified: Secondary | ICD-10-CM | POA: Diagnosis not present

## 2021-11-02 DIAGNOSIS — E876 Hypokalemia: Secondary | ICD-10-CM | POA: Diagnosis not present

## 2021-11-02 DIAGNOSIS — I10 Essential (primary) hypertension: Secondary | ICD-10-CM | POA: Diagnosis not present

## 2021-11-02 DIAGNOSIS — E440 Moderate protein-calorie malnutrition: Secondary | ICD-10-CM | POA: Diagnosis not present

## 2021-11-02 LAB — BASIC METABOLIC PANEL
Anion gap: 7 (ref 5–15)
BUN: 13 mg/dL (ref 8–23)
CO2: 21 mmol/L — ABNORMAL LOW (ref 22–32)
Calcium: 7.3 mg/dL — ABNORMAL LOW (ref 8.9–10.3)
Chloride: 106 mmol/L (ref 98–111)
Creatinine, Ser: 0.58 mg/dL (ref 0.44–1.00)
GFR, Estimated: >60 mL/min (ref >60)
Glucose, Bld: 99 mg/dL (ref 70–99)
Potassium: 3.4 mmol/L — ABNORMAL LOW (ref 3.5–5.1)
Sodium: 134 mmol/L — ABNORMAL LOW (ref 135–145)

## 2021-11-02 MED ORDER — DIPHENOXYLATE-ATROPINE 2.5-0.025 MG PO TABS
2.0000 | ORAL_TABLET | Freq: Four times a day (QID) | ORAL | Status: DC | PRN
  Administered 2021-11-02 – 2021-11-06 (×17): 2 via ORAL
  Filled 2021-11-02 (×18): qty 2

## 2021-11-02 MED ORDER — ADULT MULTIVITAMIN W/MINERALS CH
1.0000 | ORAL_TABLET | Freq: Every day | ORAL | Status: DC
  Administered 2021-11-02 – 2021-11-20 (×19): 1 via ORAL
  Filled 2021-11-02 (×22): qty 1

## 2021-11-02 MED ORDER — POTASSIUM CHLORIDE CRYS ER 20 MEQ PO TBCR
40.0000 meq | EXTENDED_RELEASE_TABLET | Freq: Once | ORAL | Status: AC
  Administered 2021-11-02: 40 meq via ORAL
  Filled 2021-11-02: qty 2

## 2021-11-02 MED ORDER — ENSURE ENLIVE PO LIQD: 237.0000 mL | Freq: Two times a day (BID) | ORAL | Status: DC

## 2021-11-02 NOTE — Progress Notes (Addendum)
Initial Nutrition Assessment RD working remotely.  DOCUMENTATION CODES:   Not applicable  INTERVENTION:  - will order [employer] with whole milk TID starting 9/18, each supplement will provide 220 kcal and 13 grams protein.  - will order Ensure Plus High Protein BID, each supplement provides 350 kcal and 20 grams of protein.  - will order 1 tablet multivitamin with minerals/day.  - complete NFPE when feasible.  - will communicate with [place] RD.   NUTRITION DIAGNOSIS:   Increased nutrient needs related to acute illness, cancer and cancer related treatments, chronic illness as evidenced by estimated needs.  GOAL:   Patient will meet greater than or equal to 90% of their needs  MONITOR:   PO intake, Supplement acceptance, Diet advancement, Labs, Weight trends, I & O's  REASON FOR ASSESSMENT:   Consult Assessment of nutrition requirement/status  ASSESSMENT:   76 y.o. female with medical history of anemia, asthma, HLD, GERD, HTN, colorectal cancer undergoing radiation with concurrent xeloda. She presented to the ED due to intractable diarrhea thought to be 2/2 recent radiation.  Able to get through via call into patient's room phone. Her niece answered and shared that patient was napping. Niece appreciative of call and shares that she will inform patient when she wakes up that RD called and that they will reach out with any questions or need for support. Unable to obtain nutrition-related information from niece at the time of call as she did not want discussion to wake patient.  Diet advanced from NPO to CLD on 9/15 and to [place] yesterday at [facility]. No intakes documented this admission.  Patient was seen by [place] RD on 8/31. Note from that date reviewed. Patient was having nausea, diarrhea, and poor appetite. She was doing best with soft, more bland foods. Se was drinking [employer] Core Life supplements (170 kcal and 26 grams protein each) and had  tried Enterade though she vomited after drinking it. Patient was provided with pedialyte powder samples and a case of Ensure Plus High Protein.   Weight on 9/14 was documented as 120 lb and appears to be a stated weight. Weight on 9/13 was 122 lb and weight on 6/13 was 135 lb. This indicates 12 lb weight loss (9% body weight) in the past 3 months; significant for time frame.   No information documented in the edema section of flow sheet this admission.  MD note from today outlines that patient has expressed consideration of stopping cancer treatments as they have negatively impacted her QOL.    Lab reviewed; Na: 134 mmol/l, K: 3.4 mmol/l, Ca: 7.3 mg/dl.  Medications reviewed; lomotil PRN, 40 mEq Klor-Con x1 dose 9/17.  IVF; LR @ 75 ml/hr.    NUTRITION - FOCUSED PHYSICAL EXAM:  RD working remotely.  Diet Order:   Diet Order             Diet full liquid Room service appropriate? Yes; Fluid consistency: Thin  Diet effective now                   EDUCATION NEEDS:   Education needs have been addressed  Skin:  Skin Assessment: Reviewed RN Assessment  Last BM:  9/16 (type 7 x1, medium amount)  Height:   Ht Readings from Last 1 Encounters:  10/30/21 '5\' 3"'$  (1.6 m)    Weight:   Wt Readings from Last 1 Encounters:  10/30/21 54.4 kg     BMI:  Body mass index is 21.26 kg/m.  Estimated Nutritional Needs:  Kcal:  1650-1850 kcal Protein:  80-95 grams Fluid:  >/= 1.8 L/day      Jarome Matin, MS, RD, LDN, CNSC Registered Dietitian II Inpatient Clinical Nutrition On-call/weekend pager # available in [facility]

## 2021-11-02 NOTE — Progress Notes (Addendum)
PROGRESS NOTE    Regina Knox  [id number]:[id number] DOB: 1945-04-15 DOA: 10/30/2021 PCP: Chevis Pretty, FNP   Brief Narrative: Regina Knox is a 76 y.o. female with a history of anemia, asthma, hyperlipidemia, GERD, hypertension, colorectal cancer. Patient presented secondary to intractable diarrhea likely secondary to recent radiation therapy. Supportive care.   Assessment & Plan:   Principal Problem:   Intractable diarrhea Active Problems:   Benign essential hypertension   Hypokalemia   Protein-calorie malnutrition, moderate (HCC)   Hypocalcemia   Intractable diarrhea Likely secondary to radiation therapy and resultant radiation enteritis. Afebrile. No leukocytosis or leukopenia. No abdominal pain. C. Difficile and GI pathogen panel tests negative. Emesis resolved. Supportive care -Continue IV fluids -Advance diet as tolerated -Lomitil prn   Rectal cancer Patient is on Xeloda and receiving radiation treatment. Patient is considering stopping treatments as her quality of life is suffering; she is hoping to speak with medical oncology. Her primary oncologist is not available, unfortunately. On-call oncology contacted for consult. Patient will follow-up with oncology as an outpatient to discuss further treatment options/recommendations.   Hypocalcemia Falsely low, secondary to hypoalbuminemia. Corrected calcium (9/15) of 8.6 which is within normal limits.   Moderate malnutrition Unsure of diagnosis. No prior history noted. Nutrition management complicated by cancer diagnosis and treatments with resultant adverse effects. -Dietitian consult   Hypokalemia Likely secondary to diarrheal illness. Supplementation given. Potassium of 3.4 on BMP this morning. -Continue potassium supplementation as needed -Telemetry   Primary hypertension Patient is on olmesartan-hydrochlorothiazide as an outpatient which were held on admission secondary to soft blood pressure and  hypovolemia. Blood pressure is currently normotensive.   DVT prophylaxis: Heparin subq Code Status:   Code Status: Full Code Family Communication: None at bedside Disposition Plan: Discharge home once diarrhea frequency improves and oral intake improves   Consultants:  Medical oncology  Procedures:  None  Antimicrobials: None    Subjective: Continued diarrhea which improves when able to use Lomotil. No emesis.  Objective: Vitals:   11/01/21 2101 11/01/21 2337 11/02/21 0355 11/02/21 0729  BP:  (!) 108/48 (!) 113/53   Pulse: (!) 101 (!) 102 (!) 106   Resp:  20 20   Temp:  97.7 F (36.5 C) 98.2 F (36.8 C)   TempSrc:      SpO2:  99% 100% 99%  Weight:      Height:        Intake/Output Summary (Last 24 hours) at 11/02/2021 0845 Last data filed at 11/02/2021 0500 Gross per 24 hour  Intake 480 ml  Output --  Net 480 ml    Filed Weights   10/30/21 1147  Weight: 54.4 kg    Examination:  General exam: Appears calm and comfortable Respiratory system: Clear to auscultation. Respiratory effort normal. Cardiovascular system: S1 & S2 heard. Gastrointestinal system: Abdomen is nondistended, soft and nontender. Normal bowel sounds heard. Central nervous system: Alert and oriented. No focal neurological deficits. Musculoskeletal: No edema. No calf tenderness Psychiatry: Judgement and insight appear normal. Mood & affect appropriate.   Data Reviewed: I have personally reviewed following labs and imaging studies  CBC Lab Results  Component Value Date   WBC 4.3 10/30/2021   RBC 3.42 (L) 10/30/2021   HGB 10.8 (L) 10/30/2021   HCT 32.2 (L) 10/30/2021   MCV 94.2 10/30/2021   MCH 31.6 10/30/2021   PLT 293 10/30/2021   MCHC 33.5 10/30/2021   RDW 18.7 (H) 10/30/2021   LYMPHSABS 0.3 (L) 10/30/2021   MONOABS  0.4 10/30/2021   EOSABS 0.1 10/30/2021   BASOSABS 0.0 16/11/9602     Last metabolic panel Lab Results  Component Value Date   NA 134 (L) 11/02/2021   K 3.4  (L) 11/02/2021   CL 106 11/02/2021   CO2 21 (L) 11/02/2021   BUN 13 11/02/2021   CREATININE 0.58 11/02/2021   GLUCOSE 99 11/02/2021   GFRNONAA >60 11/02/2021   GFRAA 71 04/28/2019   CALCIUM 7.3 (L) 11/02/2021   PROT 4.2 (L) 10/31/2021   ALBUMIN 2.3 (L) 10/31/2021   LABGLOB 2.4 08/25/2021   AGRATIO 1.8 08/25/2021   BILITOT 0.9 10/31/2021   ALKPHOS 47 10/31/2021   AST 14 (L) 10/31/2021   ALT 10 10/31/2021   ANIONGAP 7 11/02/2021    CBG (last 3)  No results for input(s): "GLUCAP" in the last 72 hours.   GFR: Estimated Creatinine Clearance: 47.9 mL/min (by C-G formula based on SCr of 0.58 mg/dL).  Coagulation Profile: No results for input(s): "INR", "PROTIME" in the last 168 hours.  Recent Results (from the past 240 hour(s))  Gastrointestinal Panel by PCR , Stool     Status: None   Collection Time: 10/31/21  8:23 AM   Specimen: STOOL  Result Value Ref Range Status   Campylobacter species NOT DETECTED NOT DETECTED Final   Plesimonas shigelloides NOT DETECTED NOT DETECTED Final   Salmonella species NOT DETECTED NOT DETECTED Final   Yersinia enterocolitica NOT DETECTED NOT DETECTED Final   Vibrio species NOT DETECTED NOT DETECTED Final   Vibrio cholerae NOT DETECTED NOT DETECTED Final   Enteroaggregative E coli (EAEC) NOT DETECTED NOT DETECTED Final   Enteropathogenic E coli (EPEC) NOT DETECTED NOT DETECTED Final   Enterotoxigenic E coli (ETEC) NOT DETECTED NOT DETECTED Final   Shiga like toxin producing E coli (STEC) NOT DETECTED NOT DETECTED Final   Shigella/Enteroinvasive E coli (EIEC) NOT DETECTED NOT DETECTED Final   Cryptosporidium NOT DETECTED NOT DETECTED Final   Cyclospora cayetanensis NOT DETECTED NOT DETECTED Final   Entamoeba histolytica NOT DETECTED NOT DETECTED Final   Giardia lamblia NOT DETECTED NOT DETECTED Final   Adenovirus F40/41 NOT DETECTED NOT DETECTED Final   Astrovirus NOT DETECTED NOT DETECTED Final   Norovirus GI/GII NOT DETECTED NOT DETECTED  Final   Rotavirus A NOT DETECTED NOT DETECTED Final   Sapovirus (I, II, IV, and V) NOT DETECTED NOT DETECTED Final    Comment: Performed at [facility], [address] [place], [place] [address]  C Difficile Quick Screen w PCR reflex     Status: None   Collection Time: 10/31/21  8:24 AM   Specimen: STOOL  Result Value Ref Range Status   C Diff antigen NEGATIVE NEGATIVE Final   C Diff toxin NEGATIVE NEGATIVE Final   C Diff interpretation No C. difficile detected.  Final    Comment: Performed at [facility], [address] [place], [place] [address]        Radiology Studies: No results found.      Scheduled Meds:  budesonide  0.25 mg Nebulization BID   heparin  5,000 Units Subcutaneous Q8H   Continuous Infusions:  lactated ringers 75 mL/hr at 11/01/21 2057     LOS: 3 days     Cordelia Poche, MD [facility] 11/02/2021, 8:45 AM  If 7PM-7AM, please contact night-coverage [email or web address]

## 2021-11-03 DIAGNOSIS — E440 Moderate protein-calorie malnutrition: Secondary | ICD-10-CM | POA: Diagnosis not present

## 2021-11-03 DIAGNOSIS — I10 Essential (primary) hypertension: Secondary | ICD-10-CM | POA: Diagnosis not present

## 2021-11-03 DIAGNOSIS — E876 Hypokalemia: Secondary | ICD-10-CM | POA: Diagnosis not present

## 2021-11-03 DIAGNOSIS — R197 Diarrhea, unspecified: Secondary | ICD-10-CM | POA: Diagnosis not present

## 2021-11-03 LAB — BASIC METABOLIC PANEL
Anion gap: 7 (ref 5–15)
BUN: 13 mg/dL (ref 8–23)
CO2: 24 mmol/L (ref 22–32)
Calcium: 7.5 mg/dL — ABNORMAL LOW (ref 8.9–10.3)
Chloride: 104 mmol/L (ref 98–111)
Creatinine, Ser: 0.6 mg/dL (ref 0.44–1.00)
GFR, Estimated: >60 mL/min (ref >60)
Glucose, Bld: 84 mg/dL (ref 70–99)
Potassium: 3.9 mmol/L (ref 3.5–5.1)
Sodium: 135 mmol/L (ref 135–145)

## 2021-11-03 LAB — MAGNESIUM: Magnesium: 1.8 mg/dL (ref 1.7–2.4)

## 2021-11-03 MED ORDER — FUROSEMIDE 10 MG/ML IJ SOLN
20.0000 mg | Freq: Once | INTRAMUSCULAR | Status: AC
  Administered 2021-11-03: 20 mg via INTRAVENOUS
  Filled 2021-11-03: qty 2

## 2021-11-03 NOTE — Progress Notes (Addendum)
PROGRESS NOTE    Regina Knox  [id number]:[id number] DOB: [birth year] DOA: 10/30/2021 PCP: Chevis Pretty, FNP   Brief Narrative: Regina Knox is a 76 y.o. female with a history of anemia, asthma, hyperlipidemia, GERD, hypertension, colorectal cancer. Patient presented secondary to intractable diarrhea likely secondary to recent radiation therapy. Supportive care.   Assessment & Plan:   Principal Problem:   Intractable diarrhea Active Problems:   Benign essential hypertension   Hypokalemia   Protein-calorie malnutrition, moderate (HCC)   Hypocalcemia   Intractable diarrhea Likely secondary to radiation therapy and resultant radiation enteritis. Afebrile. No leukocytosis or leukopenia. No abdominal pain. C. Difficile and GI pathogen panel tests negative. Emesis resolved. Supportive care -Continue IV fluids -Soft diet; low fiber -Lomitil prn   Rectal cancer Patient is on Xeloda and receiving radiation treatment. Patient is considering stopping treatments as her quality of life is suffering; she is hoping to speak with medical oncology. Her primary oncologist is not available, unfortunately. On-call oncology contacted for consult. Patient will follow-up with oncology as an outpatient to discuss further treatment options/recommendations.   Hypocalcemia Falsely low, secondary to hypoalbuminemia. Corrected calcium (9/15) of 8.6 which is within normal limits.   Increased nutrient needs No malnutrition noted by dietitian. Nutrition management complicated by cancer diagnosis and treatments with resultant adverse effects. -Dietitian recommendations (9/17): will order [facility] Instant Breakfast with whole milk TID starting 9/18, each supplement will provide 220 kcal and 13 grams protein. will order Ensure Plus High Protein BID, each supplement provides 350 kcal and 20 grams of protein. will order 1 tablet multivitamin with minerals/day. complete NFPE when feasible. will  communicate with [place] RD.   Hypokalemia Likely secondary to diarrheal illness. Supplementation given. Potassium of 3.4 on BMP this morning. -Continue potassium supplementation as needed -Telemetry   Primary hypertension Patient is on olmesartan-hydrochlorothiazide as an outpatient which were held on admission secondary to soft blood pressure and hypovolemia. Blood pressure is currently normotensive.  Peripheral edema In setting of hypoalbuminemia and IV fluids. Patient still with poor oral intake. -Lasix 20 mg IV x1   DVT prophylaxis: Heparin subq Code Status:   Code Status: Full Code Family Communication: None at bedside Disposition Plan: Discharge home once diarrhea frequency improves and oral intake improves   Consultants:  Medical oncology  Procedures:  None  Antimicrobials: None    Subjective: Continued diarrhea which improves when able to use Lomotil. No emesis.  Objective: Vitals:   11/02/21 2013 11/02/21 2136 11/03/21 0458 11/03/21 0809  BP:  (!) 106/50 (!) 108/56   Pulse:  100 98 99  Resp:  '18 18 16  '$ Temp:  97.6 F (36.4 C) 98.1 F (36.7 C)   TempSrc:  Oral Oral   SpO2: 98% 99% 100% 98%  Weight:      Height:        Intake/Output Summary (Last 24 hours) at 11/03/2021 0928 Last data filed at 11/03/2021 0840 Gross per 24 hour  Intake 3135.87 ml  Output --  Net 3135.87 ml    Filed Weights   10/30/21 1147  Weight: 54.4 kg    Examination:  General exam: Appears calm and comfortable Respiratory system: Clear to auscultation. Respiratory effort normal. Cardiovascular system: S1 & S2 heard. Gastrointestinal system: Abdomen is nondistended, soft and nontender. Normal bowel sounds heard. Central nervous system: Alert and oriented. No focal neurological deficits. Musculoskeletal: No edema. No calf tenderness Psychiatry: Judgement and insight appear normal. Mood & affect appropriate.   Data Reviewed: I have  personally reviewed following labs  and imaging studies  CBC Lab Results  Component Value Date   WBC 4.3 10/30/2021   RBC 3.42 (L) 10/30/2021   HGB 10.8 (L) 10/30/2021   HCT 32.2 (L) 10/30/2021   MCV 94.2 10/30/2021   MCH 31.6 10/30/2021   PLT 293 10/30/2021   MCHC 33.5 10/30/2021   RDW 18.7 (H) 10/30/2021   LYMPHSABS 0.3 (L) 10/30/2021   MONOABS 0.4 10/30/2021   EOSABS 0.1 10/30/2021   BASOSABS 0.0 99/24/2683     Last metabolic panel Lab Results  Component Value Date   NA 135 11/03/2021   K 3.9 11/03/2021   CL 104 11/03/2021   CO2 24 11/03/2021   BUN 13 11/03/2021   CREATININE 0.60 11/03/2021   GLUCOSE 84 11/03/2021   GFRNONAA >60 11/03/2021   GFRAA 71 04/28/2019   CALCIUM 7.5 (L) 11/03/2021   PROT 4.2 (L) 10/31/2021   ALBUMIN 2.3 (L) 10/31/2021   LABGLOB 2.4 08/25/2021   AGRATIO 1.8 08/25/2021   BILITOT 0.9 10/31/2021   ALKPHOS 47 10/31/2021   AST 14 (L) 10/31/2021   ALT 10 10/31/2021   ANIONGAP 7 11/03/2021    CBG (last 3)  No results for input(s): "GLUCAP" in the last 72 hours.   GFR: Estimated Creatinine Clearance: 47.9 mL/min (by C-G formula based on SCr of 0.6 mg/dL).  Coagulation Profile: No results for input(s): "INR", "PROTIME" in the last 168 hours.  Recent Results (from the past 240 hour(s))  Gastrointestinal Panel by PCR , Stool     Status: None   Collection Time: 10/31/21  8:23 AM   Specimen: STOOL  Result Value Ref Range Status   Campylobacter species NOT DETECTED NOT DETECTED Final   Plesimonas shigelloides NOT DETECTED NOT DETECTED Final   Salmonella species NOT DETECTED NOT DETECTED Final   Yersinia enterocolitica NOT DETECTED NOT DETECTED Final   Vibrio species NOT DETECTED NOT DETECTED Final   Vibrio cholerae NOT DETECTED NOT DETECTED Final   Enteroaggregative E coli (EAEC) NOT DETECTED NOT DETECTED Final   Enteropathogenic E coli (EPEC) NOT DETECTED NOT DETECTED Final   Enterotoxigenic E coli (ETEC) NOT DETECTED NOT DETECTED Final   Shiga like toxin producing E  coli (STEC) NOT DETECTED NOT DETECTED Final   Shigella/Enteroinvasive E coli (EIEC) NOT DETECTED NOT DETECTED Final   Cryptosporidium NOT DETECTED NOT DETECTED Final   Cyclospora cayetanensis NOT DETECTED NOT DETECTED Final   Entamoeba histolytica NOT DETECTED NOT DETECTED Final   Giardia lamblia NOT DETECTED NOT DETECTED Final   Adenovirus F40/41 NOT DETECTED NOT DETECTED Final   Astrovirus NOT DETECTED NOT DETECTED Final   Norovirus GI/GII NOT DETECTED NOT DETECTED Final   Rotavirus A NOT DETECTED NOT DETECTED Final   Sapovirus (I, II, IV, and V) NOT DETECTED NOT DETECTED Final    Comment: Performed at [facility], [address] [place], [place] [address]  C Difficile Quick Screen w PCR reflex     Status: None   Collection Time: 10/31/21  8:24 AM   Specimen: STOOL  Result Value Ref Range Status   C Diff antigen NEGATIVE NEGATIVE Final   C Diff toxin NEGATIVE NEGATIVE Final   C Diff interpretation No C. difficile detected.  Final    Comment: Performed at [facility], [address] [place], [place] [address]        Radiology Studies: No results found.      Scheduled Meds:  budesonide  0.25 mg Nebulization BID   feeding  supplement  237 mL Oral BID BM   furosemide  20 mg Intravenous Once   heparin  5,000 Units Subcutaneous Q8H   multivitamin with minerals  1 tablet Oral Daily   Continuous Infusions:  lactated ringers 75 mL/hr at 11/03/21 0725     LOS: 4 days     Cordelia Poche, MD [facility] 11/03/2021, 9:28 AM  If 7PM-7AM, please contact night-coverage [email or web address]

## 2021-11-04 DIAGNOSIS — E876 Hypokalemia: Secondary | ICD-10-CM | POA: Diagnosis not present

## 2021-11-04 DIAGNOSIS — E440 Moderate protein-calorie malnutrition: Secondary | ICD-10-CM | POA: Diagnosis not present

## 2021-11-04 DIAGNOSIS — R197 Diarrhea, unspecified: Secondary | ICD-10-CM | POA: Diagnosis not present

## 2021-11-04 DIAGNOSIS — I10 Essential (primary) hypertension: Secondary | ICD-10-CM | POA: Diagnosis not present

## 2021-11-04 MED ORDER — PANTOPRAZOLE SODIUM 40 MG PO TBEC
40.0000 mg | DELAYED_RELEASE_TABLET | Freq: Every day | ORAL | Status: DC
  Administered 2021-11-04 – 2021-11-20 (×17): 40 mg via ORAL
  Filled 2021-11-04 (×20): qty 1

## 2021-11-04 MED ORDER — SODIUM CHLORIDE 0.9 % IV SOLN: INTRAVENOUS | Status: DC

## 2021-11-04 MED ORDER — IOHEXOL 9 MG/ML PO SOLN
ORAL | Status: AC
  Filled 2021-11-04: qty 1000

## 2021-11-04 MED ORDER — FUROSEMIDE 10 MG/ML IJ SOLN
20.0000 mg | Freq: Once | INTRAMUSCULAR | Status: AC
  Administered 2021-11-04: 20 mg via INTRAVENOUS
  Filled 2021-11-04: qty 2

## 2021-11-04 NOTE — Procedures (Signed)
Pt refused CT Abe/Pel scan

## 2021-11-04 NOTE — Progress Notes (Signed)
   11/04/21 1619  Vitals  Temp 98.4 F (36.9 C)  Temp Source Oral  BP (!) 122/59  MAP (mmHg) 77  Pulse Rate (!) 119  Resp 18  MEWS COLOR  MEWS Score Color Yellow  Oxygen Therapy  SpO2 97 %  O2 Device Room Air  MEWS Score  MEWS Temp 0  MEWS Systolic 0  MEWS Pulse 2  MEWS RR 0  MEWS LOC 0  MEWS Score 2

## 2021-11-04 NOTE — Progress Notes (Signed)
Pt refused CT Abe/Pel scan

## 2021-11-04 NOTE — Progress Notes (Signed)
Patient continues to have runny stools, consistency of water, multiple times today notified Dr. Lonny Prude he started patient on NS @ 75cc/hour. Given PRN lomotil without much relief . Continues to have poor appetite , however according to son and patient she is able to keep food down , in which she wasn't able to before. Remains yellow MEWS d/t pulse rate. Will continue to monitor for changes.

## 2021-11-04 NOTE — Progress Notes (Signed)
   11/04/21 1256  Assess: MEWS Score  Temp 97.7 F (36.5 C)  BP 128/71  MAP (mmHg) 87  Pulse Rate (!) 112  Resp 16  SpO2 97 %  O2 Device Room Air  Assess: MEWS Score  MEWS Temp 0  MEWS Systolic 0  MEWS Pulse 2  MEWS RR 0  MEWS LOC 0  MEWS Score 2  MEWS Score Color Yellow  Assess: if the MEWS score is Yellow or Red  Were vital signs taken at a resting state? Yes  Focused Assessment No change from prior assessment  Does the patient meet 2 or more of the SIRS criteria? No  MEWS guidelines implemented *See Row Information* Yes  Take Vital Signs  Increase Vital Sign Frequency  Yellow: Q 2hr X 2 then Q 4hr X 2, if remains yellow, continue Q 4hrs  Escalate  MEWS: Escalate Yellow: discuss with charge nurse/RN and consider discussing with provider and RRT  Notify: Charge Nurse/RN  Name of Charge Nurse/RN Notified Izora Gala, RN  Date Charge Nurse/RN Notified 11/04/21  Time Charge Nurse/RN Notified 1305  Notify: Provider  Provider Name/Title Dr,. [place]  Date Provider Notified 11/04/21  Time Provider Notified 1305  Method of Notification Page  Notification Reason Other (Comment);Change in status  Assess: SIRS CRITERIA  SIRS Temperature  0  SIRS Pulse 1  SIRS Respirations  0  SIRS WBC 1  SIRS Score Sum  2

## 2021-11-04 NOTE — Progress Notes (Signed)
PROGRESS NOTE    Regina Knox  [id number]:[id number] DOB: 08-22-1945 DOA: 10/30/2021 PCP: Chevis Pretty, FNP   Brief Narrative: Regina Knox is a 76 y.o. female with a history of anemia, asthma, hyperlipidemia, GERD, hypertension, colorectal cancer. Patient presented secondary to intractable diarrhea likely secondary to recent radiation therapy. Supportive care.   Assessment & Plan:   Principal Problem:   Intractable diarrhea Active Problems:   Benign essential hypertension   Hypokalemia   Protein-calorie malnutrition, moderate (HCC)   Hypocalcemia   Intractable diarrhea Likely secondary to radiation therapy and resultant radiation enteritis. Afebrile. No leukocytosis or leukopenia. No abdominal pain. C. Difficile and GI pathogen panel tests negative. Emesis resolved. Diarrhea frequency is improving.  Supportive care -Continue IV fluids -Soft diet; low fiber -Lomitil prn   Rectal cancer Patient is on Xeloda and receiving radiation treatment. Patient is considering stopping treatments as her quality of life is suffering; she is hoping to speak with medical oncology. Her primary oncologist is not available, unfortunately. On-call oncology contacted for consult. Patient will follow-up with oncology as an outpatient to discuss further treatment options/recommendations.   Hypocalcemia Falsely low, secondary to hypoalbuminemia. Corrected calcium (9/15) of 8.6 which is within normal limits.   Increased nutrient needs No malnutrition noted by dietitian. Nutrition management complicated by cancer diagnosis and treatments with resultant adverse effects. -Dietitian recommendations (9/17): will order [facility] Instant Breakfast with whole milk TID starting 9/18, each supplement will provide 220 kcal and 13 grams protein. will order Ensure Plus High Protein BID, each supplement provides 350 kcal and 20 grams of protein. will order 1 tablet multivitamin with minerals/day. complete  NFPE when feasible. will communicate with [place] RD.   Hypokalemia Likely secondary to diarrheal illness. Supplementation given. Potassium of 3.4 on BMP this morning. -Continue potassium supplementation as needed -Telemetry   Primary hypertension Patient is on olmesartan-hydrochlorothiazide as an outpatient which were held on admission secondary to soft blood pressure and hypovolemia. Blood pressure is currently normotensive.  Peripheral edema In setting of hypoalbuminemia and IV fluids. Oral intake improved. -Discontinue IV fluids -Lasix 20 mg IV x1 today   DVT prophylaxis: Heparin subq Code Status:   Code Status: Full Code Family Communication: None at bedside Disposition Plan: Discharge home once diarrhea frequency improves and oral intake improves. Anticipate discharge home in 1-3 days.   Consultants:  Medical oncology  Procedures:  None  Antimicrobials: None    Subjective: One episode of diarrhea overnight. Several episodes during the day. Overall, frequency is improved. Currently eating breakfast.  Objective: Vitals:   11/03/21 2151 11/04/21 0421 11/04/21 0854 11/04/21 1256  BP: (!) 109/57 (!) 106/57  128/71  Pulse: 100 100  (!) 112  Resp: '18 18  16  '$ Temp: 98.2 F (36.8 C) 97.8 F (36.6 C)  97.7 F (36.5 C)  TempSrc:  Oral  Oral  SpO2: 97% 97% 98% 97%  Weight:      Height:        Intake/Output Summary (Last 24 hours) at 11/04/2021 1356 Last data filed at 11/04/2021 1100 Gross per 24 hour  Intake 792.5 ml  Output --  Net 792.5 ml    Filed Weights   10/30/21 1147  Weight: 54.4 kg    Examination:  General exam: Appears calm and comfortable Respiratory system: Clear to auscultation. Respiratory effort normal. Cardiovascular system: S1 & S2 heard, RRR. Gastrointestinal system: Abdomen is nondistended, soft and nontender. No organomegaly or masses felt. Normal bowel sounds heard. Central nervous system: Alert  and oriented. No focal  neurological deficits. Musculoskeletal: BLE 2+ edema. Left hand 1+ edema. 3No calf tenderness Skin: No cyanosis. No rashes Psychiatry: Judgement and insight appear normal. Mood & affect appropriate.   Data Reviewed: I have personally reviewed following labs and imaging studies  CBC Lab Results  Component Value Date   WBC 4.3 10/30/2021   RBC 3.42 (L) 10/30/2021   HGB 10.8 (L) 10/30/2021   HCT 32.2 (L) 10/30/2021   MCV 94.2 10/30/2021   MCH 31.6 10/30/2021   PLT 293 10/30/2021   MCHC 33.5 10/30/2021   RDW 18.7 (H) 10/30/2021   LYMPHSABS 0.3 (L) 10/30/2021   MONOABS 0.4 10/30/2021   EOSABS 0.1 10/30/2021   BASOSABS 0.0 93/26/7124     Last metabolic panel Lab Results  Component Value Date   NA 135 11/03/2021   K 3.9 11/03/2021   CL 104 11/03/2021   CO2 24 11/03/2021   BUN 13 11/03/2021   CREATININE 0.60 11/03/2021   GLUCOSE 84 11/03/2021   GFRNONAA >60 11/03/2021   GFRAA 71 04/28/2019   CALCIUM 7.5 (L) 11/03/2021   PROT 4.2 (L) 10/31/2021   ALBUMIN 2.3 (L) 10/31/2021   LABGLOB 2.4 08/25/2021   AGRATIO 1.8 08/25/2021   BILITOT 0.9 10/31/2021   ALKPHOS 47 10/31/2021   AST 14 (L) 10/31/2021   ALT 10 10/31/2021   ANIONGAP 7 11/03/2021    CBG (last 3)  No results for input(s): "GLUCAP" in the last 72 hours.   GFR: Estimated Creatinine Clearance: 47.9 mL/min (by C-G formula based on SCr of 0.6 mg/dL).  Coagulation Profile: No results for input(s): "INR", "PROTIME" in the last 168 hours.  Recent Results (from the past 240 hour(s))  Gastrointestinal Panel by PCR , Stool     Status: None   Collection Time: 10/31/21  8:23 AM   Specimen: STOOL  Result Value Ref Range Status   Campylobacter species NOT DETECTED NOT DETECTED Final   Plesimonas shigelloides NOT DETECTED NOT DETECTED Final   Salmonella species NOT DETECTED NOT DETECTED Final   Yersinia enterocolitica NOT DETECTED NOT DETECTED Final   Vibrio species NOT DETECTED NOT DETECTED Final   Vibrio cholerae  NOT DETECTED NOT DETECTED Final   Enteroaggregative E coli (EAEC) NOT DETECTED NOT DETECTED Final   Enteropathogenic E coli (EPEC) NOT DETECTED NOT DETECTED Final   Enterotoxigenic E coli (ETEC) NOT DETECTED NOT DETECTED Final   Shiga like toxin producing E coli (STEC) NOT DETECTED NOT DETECTED Final   Shigella/Enteroinvasive E coli (EIEC) NOT DETECTED NOT DETECTED Final   Cryptosporidium NOT DETECTED NOT DETECTED Final   Cyclospora cayetanensis NOT DETECTED NOT DETECTED Final   Entamoeba histolytica NOT DETECTED NOT DETECTED Final   Giardia lamblia NOT DETECTED NOT DETECTED Final   Adenovirus F40/41 NOT DETECTED NOT DETECTED Final   Astrovirus NOT DETECTED NOT DETECTED Final   Norovirus GI/GII NOT DETECTED NOT DETECTED Final   Rotavirus A NOT DETECTED NOT DETECTED Final   Sapovirus (I, II, IV, and V) NOT DETECTED NOT DETECTED Final    Comment: Performed at [facility], [address] [place], [place] [address]  C Difficile Quick Screen w PCR reflex     Status: None   Collection Time: 10/31/21  8:24 AM   Specimen: STOOL  Result Value Ref Range Status   C Diff antigen NEGATIVE NEGATIVE Final   C Diff toxin NEGATIVE NEGATIVE Final   C Diff interpretation No C. difficile detected.  Final    Comment: Performed at  [facility], [address], [place],  [address]        Radiology Studies: No results found.      Scheduled Meds:  budesonide  0.25 mg Nebulization BID   feeding supplement  237 mL Oral BID BM   heparin  5,000 Units Subcutaneous Q8H   multivitamin with minerals  1 tablet Oral Daily   pantoprazole  40 mg Oral Daily   Continuous Infusions:     LOS: 5 days     Cordelia Poche, MD [facility] 11/04/2021, 1:56 PM  If 7PM-7AM, please contact night-coverage [email or web address]

## 2021-11-05 ENCOUNTER — Inpatient Hospital Stay: Payer: Medicare HMO | Admitting: Physician Assistant

## 2021-11-05 DIAGNOSIS — R197 Diarrhea, unspecified: Secondary | ICD-10-CM | POA: Diagnosis not present

## 2021-11-05 DIAGNOSIS — E876 Hypokalemia: Secondary | ICD-10-CM | POA: Diagnosis not present

## 2021-11-05 DIAGNOSIS — I10 Essential (primary) hypertension: Secondary | ICD-10-CM | POA: Diagnosis not present

## 2021-11-05 LAB — CBC
HCT: 31.2 % — ABNORMAL LOW (ref 36.0–46.0)
Hemoglobin: 10.6 g/dL — ABNORMAL LOW (ref 12.0–15.0)
MCH: 31.5 pg (ref 26.0–34.0)
MCHC: 34 g/dL (ref 30.0–36.0)
MCV: 92.6 fL (ref 80.0–100.0)
Platelets: 245 10*3/uL (ref 150–400)
RBC: 3.37 MIL/uL — ABNORMAL LOW (ref 3.87–5.11)
RDW: 22.2 % — ABNORMAL HIGH (ref 11.5–15.5)
WBC: 5.3 10*3/uL (ref 4.0–10.5)
nRBC: 0.8 % — ABNORMAL HIGH (ref 0.0–0.2)

## 2021-11-05 LAB — MAGNESIUM: Magnesium: 1.7 mg/dL (ref 1.7–2.4)

## 2021-11-05 LAB — HEPATIC FUNCTION PANEL
ALT: 15 U/L (ref 0–44)
AST: 21 U/L (ref 15–41)
Albumin: 1.7 g/dL — ABNORMAL LOW (ref 3.5–5.0)
Alkaline Phosphatase: 43 U/L (ref 38–126)
Bilirubin, Direct: 0.2 mg/dL (ref 0.0–0.2)
Indirect Bilirubin: 0.8 mg/dL (ref 0.3–0.9)
Total Bilirubin: 1 mg/dL (ref 0.3–1.2)
Total Protein: 3.5 g/dL — ABNORMAL LOW (ref 6.5–8.1)

## 2021-11-05 LAB — BASIC METABOLIC PANEL
Anion gap: 8 (ref 5–15)
BUN: 16 mg/dL (ref 8–23)
CO2: 27 mmol/L (ref 22–32)
Calcium: 7 mg/dL — ABNORMAL LOW (ref 8.9–10.3)
Chloride: 99 mmol/L (ref 98–111)
Creatinine, Ser: 0.6 mg/dL (ref 0.44–1.00)
GFR, Estimated: >60 mL/min (ref >60)
Glucose, Bld: 104 mg/dL — ABNORMAL HIGH (ref 70–99)
Potassium: 2.6 mmol/L — CL (ref 3.5–5.1)
Sodium: 134 mmol/L — ABNORMAL LOW (ref 135–145)

## 2021-11-05 LAB — PHOSPHORUS: Phosphorus: 2.6 mg/dL (ref 2.5–4.6)

## 2021-11-05 MED ORDER — POTASSIUM CHLORIDE 10 MEQ/100ML IV SOLN
10.0000 meq | INTRAVENOUS | Status: AC
  Administered 2021-11-05 (×4): 10 meq via INTRAVENOUS
  Filled 2021-11-05 (×3): qty 100

## 2021-11-05 MED ORDER — POTASSIUM CHLORIDE CRYS ER 20 MEQ PO TBCR
40.0000 meq | EXTENDED_RELEASE_TABLET | Freq: Two times a day (BID) | ORAL | Status: AC
  Administered 2021-11-05 (×2): 40 meq via ORAL
  Filled 2021-11-05 (×2): qty 2

## 2021-11-05 NOTE — TOC Initial Note (Signed)
Transition of Care [facility]) - Initial/Assessment Note    Patient Details  Name: Regina Knox MRN: [id number] Date of Birth: 04-20-1945  Transition of Care [facility]) CM/SW Contact:    Iona Beard, [place] Phone Number: 11/05/2021, 1:45 PM  Clinical Narrative:                 CSW updated that pts son would like to speak with caseworker. CSW spoke with pts son to update that [facility] will assist with discharge planning. CSW explained ability to set up [facility] services and order DME that may be needed. CSW explained TOC will continue following and set up services as needed. TOC to follow.   Expected Discharge Plan: [place] Barriers to Discharge: Continued Medical Work up   Patient Goals and CMS Choice Patient states their goals for this hospitalization and ongoing recovery are:: return home CMS Medicare.gov Compare Post Acute Care list provided to:: Patient Choice offered to / list presented to : Patient  Expected Discharge Plan and Services Expected Discharge Plan: [place] Acute Care Choice: [place] arrangements for the past 2 months: Single Family Home                                      Prior Living Arrangements/Services Living arrangements for the past 2 months: Single Family Home Lives with:: Self                   Activities of Daily Living Home Assistive Devices/Equipment: None ADL Screening (condition at time of admission) Patient's cognitive ability adequate to safely complete daily activities?: Yes Is the patient deaf or have difficulty hearing?: No Does the patient have difficulty seeing, even when wearing glasses/contacts?: No Does the patient have difficulty concentrating, remembering, or making decisions?: No Patient able to express need for assistance with ADLs?: Yes Does the patient have difficulty dressing or bathing?: No Independently performs ADLs?: Yes (appropriate for developmental age) Does  the patient have difficulty walking or climbing stairs?: No Weakness of Legs: None Weakness of Arms/Hands: None  Permission Sought/Granted                  Emotional Assessment              Admission diagnosis:  Dehydration [E86.0] Hypokalemia [E87.6] Intractable diarrhea [R19.7] Diarrhea, unspecified type [R19.7] Malignant neoplasm of colon, unspecified part of colon ([place]) [C18.9] Patient Active Problem List   Diagnosis Date Noted   Hypokalemia 10/31/2021   Protein-calorie malnutrition, moderate ([place]) 10/31/2021   Hypocalcemia 10/31/2021   Intractable diarrhea 10/30/2021   Anemia 09/23/2021   Gastroesophageal reflux disease without esophagitis 09/23/2021   Syncope and collapse 09/23/2021   Rectal cancer ([place]) 09/16/2021   BMI 27.0-27.9,adult 11/30/2014   Irregular heart beat 11/30/2014   Asthma, chronic 05/25/2012   Essential hypertension, benign 05/25/2012   Benign essential hypertension 05/25/2012   PCP:  Chevis Pretty, FNP Pharmacy:   [facility], [place] [address] Phone: [phone] Fax: [phone]  [id number] [id number]: [phone]: [address]Phone: [phone]: [phone]    Social Determinants of Health (  SDOH) Interventions Housing Interventions: Intervention Not Indicated  Readmission Risk Interventions    11/05/2021    1:45 PM  Readmission Risk Prevention Plan  Transportation Screening Complete  HRI or [place] Complete  Social Work Consult for [place] Planning/Counseling Complete  Palliative Care Screening Not Applicable  Medication Review Press photographer) Complete

## 2021-11-05 NOTE — Progress Notes (Signed)
PROGRESS NOTE    Regina Knox  EFE:[id number] DOB: [birth year] DOA: 10/30/2021 PCP: Chevis Pretty, FNP   Brief Narrative:  Regina Knox is a 76 y.o. Caucasian  female with a history of anemia, asthma, hyperlipidemia, GERD, hypertension, colorectal cancer. Patient presented secondary to intractable diarrhea likely secondary to recent radiation therapy. Diarrhea is slowly improving but still continues to have a significant amount. Getting Supportive Care and Antidiarrheals.  Assessment and Plan: Intractable diarrhea -Likely secondary to radiation therapy and resultant radiation enteritis. Afebrile.  -No leukocytosis or leukopenia. No abdominal pain. C. Difficile and GI pathogen panel tests negative.  -Emesis resolved. Diarrhea frequency is improving.  -C/w Supportive care -Continue IV fluids with NS at 75 mL/hr -Soft diet; low fiber -Lomitil 2 tab po 4times Daily prn Diarrhea -Patient refused CT Scan of the Abdomen and Pelvis w/o Contrast that was ordered by Dr. Lonny Prude   Rectal cancer -Patient is on Xeloda and receiving radiation treatment.  -Patient is considering stopping treatments as her quality of life is suffering; she is hoping to speak with medical oncology.  -Her primary oncologist is not available, unfortunately.  -On-call oncology contacted for consult. Patient will follow-up with oncology as an outpatient to discuss further treatment options/recommendations.   PseudoHypocalcemia -Falsely low, secondary to hypoalbuminemia. Corrected calcium (9/15) of 8.6 which is within normal limits. -Corrected Ca2+ on 9/20 is 8.8   Increased nutrient needs -No malnutrition noted by dietitian. Nutrition management complicated by cancer diagnosis and treatments with resultant adverse effects. -Dietitian recommendations (9/17): will order [facility] Instant Breakfast with whole milk TID starting 9/18, each supplement will provide 220 kcal and 13 grams protein. will order Ensure  Plus High Protein BID, each supplement provides 350 kcal and 20 grams of protein. will order 1 tablet multivitamin with minerals/day. complete NFPE when feasible. will communicate with [place] RD.   Hypokalemia -Likely secondary to diarrheal illness. Supplementation given. -Potassium of 2.6 on BMP this morning. -Replete with IV KCL 40 mEQ and po KCL 40 mEQ BID x2 -Mag Level was 1.7 and replete -Continue potassium supplementation as needed -C/w Telemetry Monitor -Repeat CMP in the AM   Hyponatremia -Patient's Na+ went from 135 -> 134 -Continue IVF hydration as above -Repeat CMP in the AM   Primary Hypertension -Patient is on olmesartan-hydrochlorothiazide as an outpatient which were held on admission secondary to soft blood pressure and hypovolemia.  -Blood pressure is currently normotensive.   Peripheral edema -In setting of hypoalbuminemia and IV fluids. Oral intake improved. -Discontinued IV fluids yesterday but resume this AM given Hyponatremia and continued Diarrhea -Lasix 20 mg IV x1 yesterday -Will need TED hose and Elevation of Extremities.   Normocytic Anemia -Patient's Hgb/Hct went from 10.8/32.2 -> 10.6/31.2 -Check Anemia Panel in the AM -Continue to Monitor for S/Sx of Bleeding; No overt bleeding noted -Repeat CBC in the AM   DVT prophylaxis: heparin injection 5,000 Units Start: 10/30/21 2215 SCDs Start: 10/30/21 2209    Code Status: Full Code Family Communication: No family present at bedside   Disposition Plan:  Level of care: Telemetry Status is: Inpatient Remains inpatient appropriate because: Continues to have significant diarrhea    Consultants:  None  Procedures:  As delineated as above  Antimicrobials:  Anti-infectives (From admission, onward)    None       Subjective: Seen and examined at bedside and she was still having diarrhea but thinks that her stools are becoming a little more formed. No nausea or vomiting now. Denies any  CP  or SOB. No other concerns or complaints at this time.   Objective: Vitals:   11/04/21 2004 11/04/21 2225 11/05/21 0545 11/05/21 0803  BP:  (!) 103/53 111/72   Pulse:  100 89 (!) 107  Resp:  '18 15 16  '$ Temp:  97.8 F (36.6 C) (!) 97.4 F (36.3 C)   TempSrc:  Oral    SpO2: 95% 99% 97% 99%  Weight:      Height:        Intake/Output Summary (Last 24 hours) at 11/05/2021 1325 Last data filed at 11/04/2021 1800 Gross per 24 hour  Intake 230.77 ml  Output --  Net 230.77 ml   Filed Weights   10/30/21 1147  Weight: 54.4 kg   Examination: Physical Exam:  Constitutional: Thin chronically ill appearing elderly Caucasian female in NAD  Respiratory: Diminished to auscultation bilaterally, no wheezing, rales, rhonchi or crackles. Normal respiratory effort and patient is not tachypenic. No accessory muscle use. Unlabored breathing   Cardiovascular: RRR, no murmurs / rubs / gallops. S1 and S2 auscultated. Mild 1+ LE Edema Abdomen: Soft, non-tender, non-distended. Bowel sounds positive.  GU: Deferred. Musculoskeletal: No clubbing / cyanosis of digits/nails. No joint deformity upper and lower extremities.  Skin: No rashes, lesions, ulcers on a limited skin evaluation. No induration; Warm and dry.  Neurologic: CN 2-12 grossly intact with no focal deficits. Romberg sign and cerebellar reflexes not assessed.  Psychiatric: Normal judgment and insight. Alert and oriented x 3. Normal mood and appropriate affect.   Data Reviewed: I have personally reviewed following labs and imaging studies  CBC: Recent Labs  Lab 10/30/21 1436 10/30/21 2317 11/05/21 0414  WBC 4.3 4.3 5.3  NEUTROABS  --  3.4  --   HGB 12.3 10.8* 10.6*  HCT 35.5* 32.2* 31.2*  MCV 90.1 94.2 92.6  PLT 334 293 970   Basic Metabolic Panel: Recent Labs  Lab 10/31/21 0325 11/01/21 0519 11/02/21 0438 11/03/21 0445 11/05/21 0414 11/05/21 0847  NA 135 135 134* 135 134*  --   K 3.3* 2.9* 3.4* 3.9 2.6*  --   CL 106 107 106  104 99  --   CO2 23 20* 21* 24 27  --   GLUCOSE 118* 103* 99 84 104*  --   BUN '17 13 13 13 16  '$ --   CREATININE 0.66 0.58 0.58 0.60 0.60  --   CALCIUM 7.2* 7.4* 7.3* 7.5* 7.0*  --   MG 1.9  --   --  1.8  --  1.7  PHOS  --   --   --   --   --  2.6   GFR: Estimated Creatinine Clearance: 47.9 mL/min (by C-G formula based on SCr of 0.6 mg/dL). Liver Function Tests: Recent Labs  Lab 10/30/21 1603 10/31/21 0325 11/05/21 0847  AST 13* 14* 21  ALT '11 10 15  '$ ALKPHOS 48 47 43  BILITOT 0.8 0.9 1.0  PROT 4.2* 4.2* 3.5*  ALBUMIN 2.3* 2.3* 1.7*   Recent Labs  Lab 10/30/21 1603  LIPASE 21   No results for input(s): "AMMONIA" in the last 168 hours. Coagulation Profile: No results for input(s): "INR", "PROTIME" in the last 168 hours. Cardiac Enzymes: No results for input(s): "CKTOTAL", "CKMB", "CKMBINDEX", "TROPONINI" in the last 168 hours. BNP (last 3 results) No results for input(s): "PROBNP" in the last 8760 hours. HbA1C: No results for input(s): "HGBA1C" in the last 72 hours. CBG: No results for input(s): "GLUCAP" in the last 168 hours.  Lipid Profile: No results for input(s): "CHOL", "HDL", "LDLCALC", "TRIG", "CHOLHDL", "LDLDIRECT" in the last 72 hours. Thyroid Function Tests: No results for input(s): "TSH", "T4TOTAL", "FREET4", "T3FREE", "THYROIDAB" in the last 72 hours. Anemia Panel: No results for input(s): "VITAMINB12", "FOLATE", "FERRITIN", "TIBC", "IRON", "RETICCTPCT" in the last 72 hours. Sepsis Labs: No results for input(s): "PROCALCITON", "LATICACIDVEN" in the last 168 hours.  Recent Results (from the past 240 hour(s))  Gastrointestinal Panel by PCR , Stool     Status: None   Collection Time: 10/31/21  8:23 AM   Specimen: STOOL  Result Value Ref Range Status   Campylobacter species NOT DETECTED NOT DETECTED Final   Plesimonas shigelloides NOT DETECTED NOT DETECTED Final   Salmonella species NOT DETECTED NOT DETECTED Final   Yersinia enterocolitica NOT DETECTED NOT  DETECTED Final   Vibrio species NOT DETECTED NOT DETECTED Final   Vibrio cholerae NOT DETECTED NOT DETECTED Final   Enteroaggregative E coli (EAEC) NOT DETECTED NOT DETECTED Final   Enteropathogenic E coli (EPEC) NOT DETECTED NOT DETECTED Final   Enterotoxigenic E coli (ETEC) NOT DETECTED NOT DETECTED Final   Shiga like toxin producing E coli (STEC) NOT DETECTED NOT DETECTED Final   Shigella/Enteroinvasive E coli (EIEC) NOT DETECTED NOT DETECTED Final   Cryptosporidium NOT DETECTED NOT DETECTED Final   Cyclospora cayetanensis NOT DETECTED NOT DETECTED Final   Entamoeba histolytica NOT DETECTED NOT DETECTED Final   Giardia lamblia NOT DETECTED NOT DETECTED Final   Adenovirus F40/41 NOT DETECTED NOT DETECTED Final   Astrovirus NOT DETECTED NOT DETECTED Final   Norovirus GI/GII NOT DETECTED NOT DETECTED Final   Rotavirus A NOT DETECTED NOT DETECTED Final   Sapovirus (I, II, IV, and V) NOT DETECTED NOT DETECTED Final    Comment: Performed at [facility], [address] [place], [place] [address]  C Difficile Quick Screen w PCR reflex     Status: None   Collection Time: 10/31/21  8:24 AM   Specimen: STOOL  Result Value Ref Range Status   C Diff antigen NEGATIVE NEGATIVE Final   C Diff toxin NEGATIVE NEGATIVE Final   C Diff interpretation No C. difficile detected.  Final    Comment: Performed at [facility], [address], [place], [place] [address]    Radiology Studies: CT ABDOMEN PELVIS WO CONTRAST  Result Date: 11/04/2021 Shirline Frees     11/04/2021  5:42 PM Pt refused CT Abe/Pel scan     Scheduled Meds:  budesonide  0.25 mg Nebulization BID   feeding supplement  237 mL Oral BID BM   heparin  5,000 Units Subcutaneous Q8H   multivitamin with minerals  1 tablet Oral Daily   pantoprazole  40 mg Oral Daily   potassium chloride  40 mEq Oral BID   Continuous Infusions:  sodium chloride 75 mL/hr at 11/05/21 0650    LOS: 6 days   Raiford Noble, DO [facility] Available via Epic secure chat 7am-7pm After these hours, please refer to coverage provider listed on [email or web address] 11/05/2021, 1:25 PM

## 2021-11-05 NOTE — Progress Notes (Signed)
Lab called with critical potassium of 2.6. MD aware, new orders given.

## 2021-11-06 DIAGNOSIS — R197 Diarrhea, unspecified: Secondary | ICD-10-CM | POA: Diagnosis not present

## 2021-11-06 DIAGNOSIS — C189 Malignant neoplasm of colon, unspecified: Secondary | ICD-10-CM | POA: Diagnosis not present

## 2021-11-06 DIAGNOSIS — I10 Essential (primary) hypertension: Secondary | ICD-10-CM | POA: Diagnosis not present

## 2021-11-06 DIAGNOSIS — E876 Hypokalemia: Secondary | ICD-10-CM | POA: Diagnosis not present

## 2021-11-06 LAB — CBC WITH DIFFERENTIAL/PLATELET
Abs Immature Granulocytes: 0.38 10*3/uL — ABNORMAL HIGH (ref 0.00–0.07)
Basophils Absolute: 0 10*3/uL (ref 0.0–0.1)
Basophils Relative: 0 %
Eosinophils Absolute: 0 10*3/uL (ref 0.0–0.5)
Eosinophils Relative: 1 %
HCT: 36.8 % (ref 36.0–46.0)
Hemoglobin: 12.4 g/dL (ref 12.0–15.0)
Immature Granulocytes: 6 %
Lymphocytes Relative: 4 %
Lymphs Abs: 0.3 10*3/uL — ABNORMAL LOW (ref 0.7–4.0)
MCH: 31.5 pg (ref 26.0–34.0)
MCHC: 33.7 g/dL (ref 30.0–36.0)
MCV: 93.4 fL (ref 80.0–100.0)
Monocytes Absolute: 0.6 10*3/uL (ref 0.1–1.0)
Monocytes Relative: 9 %
Neutro Abs: 5.4 10*3/uL (ref 1.7–7.7)
Neutrophils Relative %: 80 %
Platelets: 280 10*3/uL (ref 150–400)
RBC: 3.94 MIL/uL (ref 3.87–5.11)
RDW: 22.7 % — ABNORMAL HIGH (ref 11.5–15.5)
WBC: 6.3 10*3/uL (ref 4.0–10.5)
nRBC: 0.6 % — ABNORMAL HIGH (ref 0.0–0.2)

## 2021-11-06 LAB — COMPREHENSIVE METABOLIC PANEL
ALT: 19 U/L (ref 0–44)
AST: 30 U/L (ref 15–41)
Albumin: 2 g/dL — ABNORMAL LOW (ref 3.5–5.0)
Alkaline Phosphatase: 52 U/L (ref 38–126)
Anion gap: 8 (ref 5–15)
BUN: 18 mg/dL (ref 8–23)
CO2: 22 mmol/L (ref 22–32)
Calcium: 7.5 mg/dL — ABNORMAL LOW (ref 8.9–10.3)
Chloride: 103 mmol/L (ref 98–111)
Creatinine, Ser: 0.68 mg/dL (ref 0.44–1.00)
GFR, Estimated: >60 mL/min (ref >60)
Glucose, Bld: 119 mg/dL — ABNORMAL HIGH (ref 70–99)
Potassium: 4.8 mmol/L (ref 3.5–5.1)
Sodium: 133 mmol/L — ABNORMAL LOW (ref 135–145)
Total Bilirubin: 1.5 mg/dL — ABNORMAL HIGH (ref 0.3–1.2)
Total Protein: 4.2 g/dL — ABNORMAL LOW (ref 6.5–8.1)

## 2021-11-06 LAB — IRON AND TIBC
Iron: 32 ug/dL (ref 28–170)
Saturation Ratios: 19 % (ref 10.4–31.8)
TIBC: 170 ug/dL — ABNORMAL LOW (ref 250–450)
UIBC: 138 ug/dL

## 2021-11-06 LAB — RETICULOCYTES
Immature Retic Fract: 33.5 % — ABNORMAL HIGH (ref 2.3–15.9)
RBC.: 3.98 MIL/uL (ref 3.87–5.11)
Retic Count, Absolute: 185.9 10*3/uL (ref 19.0–186.0)
Retic Ct Pct: 4.7 % — ABNORMAL HIGH (ref 0.4–3.1)

## 2021-11-06 LAB — VITAMIN B12: Vitamin B-12: 1282 pg/mL — ABNORMAL HIGH (ref 180–914)

## 2021-11-06 LAB — FERRITIN: Ferritin: 59 ng/mL (ref 11–307)

## 2021-11-06 LAB — FOLATE: Folate: 19.3 ng/mL (ref >5.9)

## 2021-11-06 LAB — PHOSPHORUS: Phosphorus: 2.3 mg/dL — ABNORMAL LOW (ref 2.5–4.6)

## 2021-11-06 LAB — MAGNESIUM: Magnesium: 1.9 mg/dL (ref 1.7–2.4)

## 2021-11-06 MED ORDER — DIPHENOXYLATE-ATROPINE 2.5-0.025 MG PO TABS
2.0000 | ORAL_TABLET | Freq: Four times a day (QID) | ORAL | Status: DC
  Administered 2021-11-06 – 2021-11-22 (×56): 2 via ORAL
  Filled 2021-11-06 (×60): qty 2

## 2021-11-06 MED ORDER — CHOLESTYRAMINE 4 G PO PACK
4.0000 g | PACK | Freq: Every day | ORAL | Status: DC
  Administered 2021-11-06 – 2021-11-13 (×8): 4 g via ORAL
  Filled 2021-11-06 (×21): qty 1

## 2021-11-06 MED ORDER — SODIUM PHOSPHATES 45 MMOLE/15ML IV SOLN
15.0000 mmol | Freq: Once | INTRAVENOUS | Status: AC
  Administered 2021-11-06: 15 mmol via INTRAVENOUS
  Filled 2021-11-06: qty 5

## 2021-11-06 MED ORDER — SODIUM CHLORIDE 0.9 % IV BOLUS
500.0000 mL | Freq: Once | INTRAVENOUS | Status: AC
  Administered 2021-11-06: 500 mL via INTRAVENOUS

## 2021-11-06 NOTE — Progress Notes (Signed)
Pt is refusing telemetry. MD aware

## 2021-11-06 NOTE — Progress Notes (Signed)
   11/06/21 0459  Assess: MEWS Score  Temp 98.2 F (36.8 C)  BP 108/70  MAP (mmHg) 82  Pulse Rate (!) 117  Resp 20  SpO2 96 %  O2 Device Room Air  Assess: MEWS Score  MEWS Temp 0  MEWS Systolic 0  MEWS Pulse 2  MEWS RR 0  MEWS LOC 0  MEWS Score 2  MEWS Score Color Yellow  Assess: if the MEWS score is Yellow or Red  Were vital signs taken at a resting state? Yes  Focused Assessment No change from prior assessment  Does the patient meet 2 or more of the SIRS criteria? No  MEWS guidelines implemented *See Row Information* Yes  Treat  MEWS Interventions Other (Comment)  Pain Scale 0-10  Pain Score 0  Take Vital Signs  Increase Vital Sign Frequency  Yellow: Q 2hr X 2 then Q 4hr X 2, if remains yellow, continue Q 4hrs  Escalate  MEWS: Escalate Yellow: discuss with charge nurse/RN and consider discussing with provider and RRT  Notify: Charge Nurse/RN  Name of Charge Nurse/RN Notified Dema Severin, RN  Date Charge Nurse/RN Notified 11/06/21  Time Charge Nurse/RN Notified 0500  Assess: SIRS CRITERIA  SIRS Temperature  0  SIRS Pulse 1  SIRS Respirations  0  SIRS WBC 1  SIRS Score Sum  2

## 2021-11-06 NOTE — Consult Note (Addendum)
Gastroenterology Consult   Referring Provider: No ref. provider found Primary Care Physician:  Chevis Pretty, FNP Primary Gastroenterologist:  [facility] [place], MD)  Patient ID: Regina Knox; [id number]; 12-Oct-1945   Admit date: 10/30/2021  LOS: 7 days   Date of Consultation: 11/06/2021  Reason for Consultation:  intractable diarrhea    History of Present Illness   Regina Knox is a 76 y.o. female with history of colorectal cancer currently undergoing chemoradiation who presented to the emergency department from the cancer center on September 14 for intractable diarrhea and weakness.  GI consulted at this time to assist with management of intractable diarrhea.  Patient sent from the cancer center on September 14 for evaluation of weakness and diarrhea.  She was noted to have electrolyte abnormalities including hyponatremia and hypokalemia.  Patient reports that she was having some baseline diarrhea in the setting of colorectal cancer, found to have an ulcerated nearly circumferential apple core lesion in the rectum at time of colonoscopy in June 2023.  Felt to be nonobstructing at that time.  Pathology revealed invasive adenocarcinoma. She is followed by Dr. Delton Coombes, currently undergoing chemoradiation therapy.  She has been on Xeloda 1500 mg twice daily, started XRT on August 17.  Last seen by Dr. Delton Coombes on September 7 and at that time she is having at least 6-7 loose stools daily.  She was started on Lomotil 2 tablets in the morning and 1 to 2 tablets in the evening.  Patient states her diarrhea is started to get bad after 2 weeks of treatment.  She states she completed 19 of 28 planned treatments.  History of C. difficile in January 2023.  Stools never returned to normal which is why she ended up with a colonoscopy.  However prior to onset of chemoradiation, she reports that her diarrhea was mild.  However after at least 2 weeks of chemoradiation, she  started having significant diarrhea.  Started Lomotil as an outpatient approximately 4 tablets daily.  Symptoms progressed.  She is having difficulty keeping up with oral intake.  Her appetite is poor.  She does have some abdominal cramping prior to bowel movement especially if she passes a large amount of stool.  Denies any blood in the stool or melena.    Since admission her C. difficile and GI pathogen panel both negative.  Seen by dietitian, recommended Carnation Instant Breakfast with whole milk 3 times daily starting in September 18.  Per dietitian, order was to be placed.  Patient denies receiving.  She has been getting Glucerna but she states she cannot tolerate the taste and she is not sure why she needs low-carb.  She has basically not drinking the product.  She is having some yogurt and fruits.  She is on a soft diet.  She has an order for Lomotil 2 tablets 4 times daily as needed, she tries to get it every 6 hours.  If she does not get Lomotil on time, her diarrhea worsens.  Seems like her diarrhea is worse at night.  Last night she had 9 BMs overnight but has only had 1 bowel movement today.  No consistency to her stool.  Labs on admission showed hemoglobin 12.3, sodium 132, potassium 2.8, albumin 2.3, creatinine 0.71.  Her hemoglobin dropped down to 10.6, back to 12.4 today.  No leukocytosis.  Today her potassium is 4.8, albumin 2, sodium 133, creatinine 0.68.  Yesterday her potassium was 2.6, received 4 rounds of potassium along with oral  supplementation.  MRI liver September 29, 2021: Nonenhancing bilobar fluid signal lesions measuring up to 5 mm consistent with benign hepatic cyst.  No suspicious hepatic cyst.  MRI pelvis without contrast September 07, 2021: Circumferential annular type lesion in the mid to high rectum extending up to 6 mm beyond the muscularis layer compatible with T3 C disease.  N2 disease with lymph nodes in the mesorectum and along the superior rectal vein.  Prior to Admission  medications   Medication Sig Start Date End Date Taking? Authorizing Provider  capecitabine (XELODA) 500 MG tablet Take 3 tablets (1,500 mg total) by mouth 2 (two) times daily after a meal. Take Monday-Friday. Take only on days of radiation. 09/17/21  Yes Derek Jack, MD  diphenoxylate-atropine (LOMOTIL) 2.5-0.025 MG tablet Take 1 tablet by mouth 4 (four) times daily as needed for diarrhea or loose stools. 10/21/21  Yes Derek Jack, MD  fluticasone (FLONASE) 50 MCG/ACT nasal spray Place 2 sprays into both nostrils daily. Patient taking differently: Place 2 sprays into both nostrils daily as needed for allergies. 02/25/21  Yes Martin, Mary-Margaret, FNP  Fluticasone Furoate (ARNUITY ELLIPTA) 100 MCG/ACT AEPB Inhale 1 Dose into the lungs daily. Take one inhalation orally daily. 08/25/21  Yes Martin, Mary-Margaret, FNP  Iron, Ferrous Sulfate, 325 (65 Fe) MG TABS Take 325 mg by mouth daily. 08/25/21  Yes Martin, Mary-Margaret, FNP  olmesartan-hydrochlorothiazide (BENICAR HCT) 40-25 MG tablet TAKE ONE (1) TABLET EACH DAY Patient taking differently: Take 0.5 tablets by mouth daily. 08/25/21  Yes Hassell Done, Mary-Margaret, FNP  prochlorperazine (COMPAZINE) 10 MG tablet Take 1 tablet (10 mg total) by mouth every 6 (six) hours as needed for nausea or vomiting. 09/18/21  Yes Derek Jack, MD  Vitamin D, Ergocalciferol, (DRISDOL) 1.25 MG (50000 UNIT) CAPS capsule Take 1 capsule (50,000 Units total) by mouth every 7 (seven) days. 08/25/21  Yes Hassell Done, Mary-Margaret, FNP    Current Facility-Administered Medications  Medication Dose Route Frequency Provider Last Rate Last Admin   acetaminophen (TYLENOL) tablet 650 mg  650 mg Oral Q6H PRN Zierle-Ghosh, Asia B, DO       Or   acetaminophen (TYLENOL) suppository 650 mg  650 mg Rectal Q6H PRN Zierle-Ghosh, Asia B, DO       albuterol (PROVENTIL) (2.5 MG/3ML) 0.083% nebulizer solution 2.5 mg  2.5 mg Nebulization Q4H PRN Mariel Aloe, MD       budesonide  (PULMICORT) nebulizer solution 0.25 mg  0.25 mg Nebulization BID Mariel Aloe, MD   0.25 mg at 11/06/21 1001   calcium carbonate (TUMS - dosed in mg elemental calcium) chewable tablet 200 mg of elemental calcium  1 tablet Oral TID WC PRN Mariel Aloe, MD   200 mg of elemental calcium at 11/03/21 1811   diphenoxylate-atropine (LOMOTIL) 2.5-0.025 MG per tablet 2 tablet  2 tablet Oral QID PRN Mariel Aloe, MD   2 tablet at 11/06/21 0545   feeding supplement (ENSURE ENLIVE / ENSURE PLUS) liquid 237 mL  237 mL Oral BID BM Mariel Aloe, MD       heparin injection 5,000 Units  5,000 Units Subcutaneous Q8H Zierle-Ghosh, Asia B, DO       multivitamin with minerals tablet 1 tablet  1 tablet Oral Daily Mariel Aloe, MD   1 tablet at 11/06/21 0838   ondansetron (ZOFRAN) tablet 4 mg  4 mg Oral Q6H PRN Zierle-Ghosh, Asia B, DO   4 mg at 11/06/21 0838   Or   ondansetron (ZOFRAN) injection  4 mg  4 mg Intravenous Q6H PRN Zierle-Ghosh, Asia B, DO       oxyCODONE (Oxy IR/ROXICODONE) immediate release tablet 5 mg  5 mg Oral Q4H PRN Zierle-Ghosh, Asia B, DO       pantoprazole (PROTONIX) EC tablet 40 mg  40 mg Oral Daily Mariel Aloe, MD   40 mg at 11/06/21 0938   sodium chloride 0.9 % bolus 500 mL  500 mL Intravenous Once Sheikh, Omair Latif, DO       sodium phosphate 15 mmol in dextrose 5 % 250 mL infusion  15 mmol Intravenous Once Raiford [place], DO 43 mL/hr at 11/06/21 1042 15 mmol at 11/06/21 1042    Allergies as of 10/30/2021 - Review Complete 10/30/2021  Allergen Reaction Noted   Ibuprofen Rash 05/25/2012    Past Medical History:  Diagnosis Date   Allergy    Anemia    Asthma    Elevated cholesterol    GERD (gastroesophageal reflux disease)    Hypertension     Past Surgical History:  Procedure Laterality Date   BREAST SURGERY  80's   right breast   ORIF ANKLE FRACTURE Right 11/06/2013   Procedure: OPEN REDUCTION INTERNAL FIXATION (ORIF) ANKLE FRACTURE;  Surgeon: Sanjuana Kava, MD;  Location: [facility];  Service: Orthopedics;  Laterality: Right;   TUBAL LIGATION      Family History  Problem Relation Age of Onset   Hypertension Mother    Diabetes Mother    Hypertension Father    Heart disease Father    Cancer Sister        brain, breast   Hypertension Sister    Diabetes Sister    Hypertension Sister    Heart disease Brother        congestive heart failure   Diabetes Maternal Grandmother    Diabetes Paternal Grandmother    Hypertension Son    Diabetes Son    Stomach cancer Neg Hx    Esophageal cancer Neg Hx    Colon cancer Neg Hx     Social History   Socioeconomic History   Marital status: Widowed    Spouse name: Jori Moll   Number of children: 1   Years of education: 12   Highest education level: 12th grade  Occupational History   Occupation: Retired  Tobacco Use   Smoking status: Never   Smokeless tobacco: Never  Vaping Use   Vaping Use: Never used  Substance and Sexual Activity   Alcohol use: No   Drug use: No   Sexual activity: Not Currently  Other Topics Concern   Not on file  Social History Narrative   Husband passed 2021   Son lives in [place] Determinants of Health   Financial Resource Strain: Low Risk  (10/29/2021)   Overall Financial Resource Strain (CARDIA)    Difficulty of Paying Living Expenses: Not hard at all  Food Insecurity: No Food Insecurity (11/01/2021)   Hunger Vital Sign    Worried About Running Out of Food in the Last Year: Never true    [place] in the Last Year: Never true  Transportation Needs: No Transportation Needs (11/01/2021)   PRAPARE - Hydrologist (Medical): No    Lack of Transportation (Non-Medical): No  Physical Activity: Inactive (10/29/2021)   Exercise Vital Sign    Days of Exercise per Week: 0 days    Minutes of Exercise per Session: 0 min  Stress:  Stress Concern Present (10/29/2021)   [place]    Feeling of Stress : To some extent  Social Connections: Moderately Integrated (10/29/2021)   Social Connection and Isolation Panel [NHANES]    Frequency of Communication with Friends and Family: More than three times a week    Frequency of Social Gatherings with Friends and Family: Twice a week    Attends Religious Services: More than 4 times per year    Active Member of [employer] or Organizations: Yes    Attends Archivist Meetings: More than 4 times per year    Marital Status: Widowed  Intimate Partner Violence: Not At Risk (11/01/2021)   Humiliation, Afraid, Rape, and Kick questionnaire    Fear of Current or Ex-Partner: No    Emotionally Abused: No    Physically Abused: No    Sexually Abused: No     Review of System:   General: Negative for  fever, chills, fatigue, +weakness. +poor appetite Eyes: Negative for vision changes.  ENT: Negative for hoarseness, difficulty swallowing , nasal congestion. CV: Negative for chest pain, angina, palpitations, dyspnea on exertion, peripheral edema.  Respiratory: Negative for dyspnea at rest, dyspnea on exertion, cough, sputum, wheezing.  GI: See history of present illness. GU:  Negative for dysuria, hematuria, urinary incontinence, urinary frequency, nocturnal urination.  MS: Negative for joint pain, low back pain.  Derm: Negative for rash or itching.  Neuro: Negative for weakness, abnormal sensation, seizure, frequent headaches, memory loss, confusion.  Psych: Negative for anxiety, depression, suicidal ideation, hallucinations.  Endo: Negative for unusual weight change.  Heme: Negative for bruising or bleeding. Allergy: Negative for rash or hives.      Physical Examination:   Vital signs in last 24 hours: Temp:  [97.5 F (36.4 C)-99.4 F (37.4 C)] 97.5 F (36.4 C) (09/21 0900) Pulse Rate:  [106-117] 114 (09/21 0900) Resp:  [16-20] 20 (09/21 0900) BP: (102-118)/(67-73) 118/73 (09/21 0900) SpO2:  [96 %-99  %] 99 % (09/21 1001) Last BM Date : 11/05/21  General: elderly female in NAD.  Head: Normocephalic, atraumatic.   Eyes: Conjunctiva pink, no icterus. Mouth: Oropharyngeal mucosa moist and pink , no lesions erythema or exudate. Neck: Supple without thyromegaly, masses, or lymphadenopathy.  Lungs: Clear to auscultation bilaterally.  Heart: Regular rate and rhythm, no murmurs rubs or gallops.  Abdomen: Bowel sounds are normal,  nondistended, no hepatosplenomegaly or masses, no abdominal bruits or hernia , no rebound or guarding.  Mild lower abdominal tenderness. Rectal: not performed Extremities: 1+ bilateral lower extremity edema, clubbing, deformity.  Neuro: Alert and oriented x 4 , grossly normal neurologically.  Skin: Warm and dry, no rash or jaundice.   Psych: Alert and cooperative, normal mood and affect.        Intake/Output from previous day: 09/20 0701 - 09/21 0700 In: 340 [P.O.:240; I.V.:100] Out: -  Intake/Output this shift: Total I/O In: 120 [P.O.:120] Out: -   Lab Results:   CBC Recent Labs    11/05/21 0414 11/06/21 0621  WBC 5.3 6.3  HGB 10.6* 12.4  HCT 31.2* 36.8  MCV 92.6 93.4  PLT 245 280   BMET Recent Labs    11/05/21 0414 11/06/21 0621  NA 134* 133*  K 2.6* 4.8  CL 99 103  CO2 27 22  GLUCOSE 104* 119*  BUN 16 18  CREATININE 0.60 0.68  CALCIUM 7.0* 7.5*   LFT Recent Labs    11/05/21 0847 11/06/21 7622  BILITOT 1.0 1.5*  BILIDIR 0.2  --   IBILI 0.8  --   ALKPHOS 43 52  AST 21 30  ALT 15 19  PROT 3.5* 4.2*  ALBUMIN 1.7* 2.0*    Lipase No results for input(s): "LIPASE" in the last 72 hours.  PT/INR No results for input(s): "LABPROT", "INR" in the last 72 hours.   Hepatitis Panel No results for input(s): "HEPBSAG", "HCVAB", "HEPAIGM", "HEPBIGM" in the last 72 hours.   Imaging Studies:   CT ABDOMEN PELVIS WO CONTRAST  Result Date: 11/04/2021 Shirline Frees     11/04/2021  5:42 PM Pt refused CT Abe/Pel scan  [4  week]  Assessment:   76 year old female with adenocarcinoma of the rectum, diagnosed in June 2023, currently undergoing chemoradiation with Xeloda/XRT.  Developed acute on chronic diarrhea, weakness approximately 2 weeks into treatment, having completed 19 of 28 planned treatments.  GI consulted for intractable diarrhea.  Acute on chronic diarrhea: At baseline having some diarrhea in the setting of near circumferential nonobstructing tumor in the rectum but per patient was manageable.  Diarrhea worsened approximately 2 weeks into chemoradiation treatments becoming intractable the last week of treatment.  Presented with significant electrolyte abnormalities.  Degree of malnutrition.  Difficult to quantify how much diarrhea she is having at this time because all or not documented.  She believes she had around 9 overnight and only 1 so far this morning.  Lomotil 2 tablets 4 times daily as needed available, she is getting this approximately every 6 hours upon request.  Although there is mention of her receiving coronation with whole milk 3 times daily, she states she has not been receiving this.  She is not drinking nutrition shakes as they are low-carb and she does not like the flavor.  Remains on soft diet, consuming some dairy in way of yogurt.  Diarrhea likely multifactorial in the setting of Xeloda, radiation, rectal tumor.  No evidence of recurrent C. difficile or other infection.  Potassium has been repleted over the last 24 hours.  Plan:   Schedule Lomotil 2 tablets every 6 hours. In the setting of chemoradiation induced diarrhea, should try to hold dairy products as much as feasible. Continue soft/low fiber diet.  Consider trial of Questran if tolerated. Would need to be dosed apart from other medications, taking other medications 1 hour before or 4-6 hours after Questran.   LOS: 7 days   We would like to thank you for the opportunity to participate in the care of [employer].  Laureen Ochs.  Bernarda Caffey [facility] [phone] 9/21/202312:13 PM

## 2021-11-06 NOTE — Progress Notes (Signed)
PROGRESS NOTE    Regina Knox  DGU:[id number] DOB: 21-Nov-1945 DOA: 10/30/2021 PCP: Chevis Pretty, FNP   Brief Narrative:  Regina Knox is a 76 y.o. Caucasian  female with a history of anemia, asthma, hyperlipidemia, GERD, hypertension, colorectal cancer. Patient presented secondary to intractable diarrhea likely secondary to recent radiation therapy. Diarrhea is slowly improving but still continues to have a significant amount. Getting Supportive Care and Antidiarrheals but she continues appear weak and is remains tachycardic.  Bowel movements are slowly improving but given the persistence have consulted GI for further evaluation recommendations and they agree we will be more aggressive with scheduled Lomotil every 6 hours and recommending avoiding dairy products and continuing low soft diet encouraged considering a trial of cholestyramine if not improved..  Assessment and Plan: Intractable diarrhea -Likely secondary to radiation therapy and resultant radiation enteritis. Afebrile.  -No leukocytosis or leukopenia. No abdominal pain. C. Difficile and GI pathogen panel tests negative.  -Emesis resolved. Diarrhea frequency is improving.  -C/w Supportive care -Continued IV fluids with NS at 75 mL/hr and stopped. Given a 500 mL Bolus today  -Soft diet; low fiber -Lomitil 2 tab po 4times Daily prn Diarrhea -Patient refused CT Scan of the Abdomen and Pelvis w/o Contrast that was ordered by Dr. Lonny Prude -Given continued significant Diarrhea gastroenterology was consulted and they are recommending avoiding dairy products and agreed to be more aggressive with the diarrhea treatments and has scheduled Lomotil every 6 hours and recommending a trial of cholestyramine if not improved   Rectal cancer -Patient is on Xeloda and receiving radiation treatment.  -Patient is considering stopping treatments as her quality of life is suffering; she is hoping to speak with medical oncology.  -Her primary  oncologist is not available, unfortunately.  -On-call oncology Dr. Grayland Ormond contacted for consult. Patient will follow-up with oncology as an outpatient to discuss further treatment options/recommendations.   PseudoHypocalcemia -Falsely low, secondary to hypoalbuminemia. Corrected calcium (9/15) of 8.6 which is within normal limits. -Corrected Ca2+ on 9/20 is 8.8 and corrected calcium today on 11/06/2021 is 9.1   Increased nutrient needs -No malnutrition noted by dietitian. Nutrition management complicated by cancer diagnosis and treatments with resultant adverse effects. -Dietitian recommendations (9/17): will order [facility] Instant Breakfast with whole milk TID starting 9/18, each supplement will provide 220 kcal and 13 grams protein. will order Ensure Plus High Protein BID, each supplement provides 350 kcal and 20 grams of protein. will order 1 tablet multivitamin with minerals/day. complete NFPE when feasible. will communicate with [place] [facility].   Hypokalemia -Likely secondary to diarrheal illness. Supplementation given. -Potassium of 4.8 this AM  -Mag Level is now 1.9 -Continue potassium supplementation as needed -C/w Telemetry Monitor -Repeat CMP in the AM    Hyponatremia -Patient's Na+ went from 135 -> 134 -> 133 -Continue IVF hydration as above -Repeat CMP in the AM   Primary Hypertension -Patient is on olmesartan-hydrochlorothiazide as an outpatient which were held on admission secondary to soft blood pressure and hypovolemia.  -Blood pressure is currently normotensive.   Peripheral Edema -In setting of hypoalbuminemia and IV fluids. Oral intake improved. -Discontinued IV fluids yesterday but resume this AM given Hyponatremia and continued Diarrhea -Lasix 20 mg IV x1 yesterday -Will need TED hose and Elevation of Extremities.  Has also been ordered   Normocytic Anemia -Patient's Hgb/Hct went from 10.8/32.2 -> 10.6/31.2 -> 12.4/36.8 -Checked Anemia Panel showed an  iron level of 32, UIBC 138, TIBC 170, saturation of 90%, ferritin level  59, folate 19.3, vitamin B12 1282 -Continue to Monitor for S/Sx of Bleeding; No overt bleeding noted -Repeat CBC in the AM   Hypophosphatemia -Patient's phosphorus level was 2.3 -Replete with IV K-Phos 15 mmol -Continue monitor and trend and repeat Phos level in a.m.  Hyponatremia -Mild and sodium went from 135 is now 133 -Replete as above -Continue to monitor and trend and repeat CMP in a.m.  Hyperbilirubinemia -Mild and trended up as T. bili went from 0.9 -> 1.0 -> 1.5 and likely reactive -Continue to monitor and trend and repeat CMP in the a.m.  Sinus tachycardia -Patient's heart rate was elevated -Likely physiologic in the setting of above -Continue with IV fluid hydration bolus of 500 mL -Patient is refusing telemetry  DVT prophylaxis: Place TED hose Start: 11/06/21 1034 heparin injection 5,000 Units Start: 10/30/21 2215 SCDs Start: 10/30/21 2209    Code Status: Full Code Family Communication: No family currently at bedside  Disposition Plan:  Level of care: Telemetry Status is: Inpatient Remains inpatient appropriate because: Continues to be extremely fatigued and continues to have significant amount of diarrhea  Consultants:  Oncology Gastroenterology  Procedures:  None  Antimicrobials:  Anti-infectives (From admission, onward)    None       Subjective: Seen and examined at bedside and she was extremely fatigued and "worn out."  Continues have significant mild diarrhea and thought it got a little bit more formed but continues has some watery diarrhea stools.  States that she is has not been able to get any rest due to her diarrhea. No Chest pain or shortness of breath. No other concerns or complaints at this time.   Objective: Vitals:   11/06/21 0459 11/06/21 0900 11/06/21 1001 11/06/21 1224  BP: 108/70 118/73  123/70  Pulse: (!) 117 (!) 114  (!) 115  Resp: '20 20  20  '$ Temp: 98.2  F (36.8 C) (!) 97.5 F (36.4 C)  98.1 F (36.7 C)  TempSrc: Oral Oral  Oral  SpO2: 96% 98% 99% 97%  Weight:      Height:        Intake/Output Summary (Last 24 hours) at 11/06/2021 1546 Last data filed at 11/06/2021 1500 Gross per 24 hour  Intake 764.32 ml  Output --  Net 764.32 ml   Filed Weights   10/30/21 1147  Weight: 54.4 kg   Examination: Physical Exam:  Constitutional: Thin chronically ill-appearing Caucasian female currently no acute distress Respiratory: Diminished to auscultation bilaterally with coarse breath sounds, no wheezing, rales, rhonchi or crackles. Normal respiratory effort and patient is not tachypenic. No accessory muscle use.  Cardiovascular: Tachycardic rate regular rhythm, no murmurs / rubs / gallops. S1 and S2 auscultated.  Has mild 1+ lower extremity edema Abdomen: Soft, slightly tender, non-distended. Bowel sounds positive.  GU: Deferred. Musculoskeletal: No clubbing / cyanosis of digits/nails. No joint deformity upper and lower extremities.   Skin: No rashes, lesions, ulcers on limited skin evaluation. No induration; Warm and dry.  Neurologic: CN 2-12 grossly intact with no focal deficits.  Romberg sign and cerebellar reflexes not assessed.  Psychiatric: Normal judgment and insight. Alert and oriented x 3. Normal mood and appropriate affect.   Data Reviewed: I have personally reviewed following labs and imaging studies  CBC: Recent Labs  Lab 10/30/21 2317 11/05/21 0414 11/06/21 0621  WBC 4.3 5.3 6.3  NEUTROABS 3.4  --  5.4  HGB 10.8* 10.6* 12.4  HCT 32.2* 31.2* 36.8  MCV 94.2 92.6 93.4  PLT 293  245 546   Basic Metabolic Panel: Recent Labs  Lab 10/31/21 0325 11/01/21 0519 11/02/21 0438 11/03/21 0445 11/05/21 0414 11/05/21 0847 11/06/21 0621  NA 135 135 134* 135 134*  --  133*  K 3.3* 2.9* 3.4* 3.9 2.6*  --  4.8  CL 106 107 106 104 99  --  103  CO2 23 20* 21* 24 27  --  22  GLUCOSE 118* 103* 99 84 104*  --  119*  BUN '17 13 13 13  16  '$ --  18  CREATININE 0.66 0.58 0.58 0.60 0.60  --  0.68  CALCIUM 7.2* 7.4* 7.3* 7.5* 7.0*  --  7.5*  MG 1.9  --   --  1.8  --  1.7 1.9  PHOS  --   --   --   --   --  2.6 2.3*   GFR: Estimated Creatinine Clearance: 47.9 mL/min (by C-G formula based on SCr of 0.68 mg/dL). Liver Function Tests: Recent Labs  Lab 10/30/21 1603 10/31/21 0325 11/05/21 0847 11/06/21 0621  AST 13* 14* 21 30  ALT '11 10 15 19  '$ ALKPHOS 48 47 43 52  BILITOT 0.8 0.9 1.0 1.5*  PROT 4.2* 4.2* 3.5* 4.2*  ALBUMIN 2.3* 2.3* 1.7* 2.0*   Recent Labs  Lab 10/30/21 1603  LIPASE 21   No results for input(s): "AMMONIA" in the last 168 hours. Coagulation Profile: No results for input(s): "INR", "PROTIME" in the last 168 hours. Cardiac Enzymes: No results for input(s): "CKTOTAL", "CKMB", "CKMBINDEX", "TROPONINI" in the last 168 hours. BNP (last 3 results) No results for input(s): "PROBNP" in the last 8760 hours. HbA1C: No results for input(s): "HGBA1C" in the last 72 hours. CBG: No results for input(s): "GLUCAP" in the last 168 hours. Lipid Profile: No results for input(s): "CHOL", "HDL", "LDLCALC", "TRIG", "CHOLHDL", "LDLDIRECT" in the last 72 hours. Thyroid Function Tests: No results for input(s): "TSH", "T4TOTAL", "FREET4", "T3FREE", "THYROIDAB" in the last 72 hours. Anemia Panel: Recent Labs    11/06/21 0621  VITAMINB12 1,282*  FOLATE 19.3  FERRITIN 59  TIBC 170*  IRON 32  RETICCTPCT 4.7*   Sepsis Labs: No results for input(s): "PROCALCITON", "LATICACIDVEN" in the last 168 hours.  Recent Results (from the past 240 hour(s))  Gastrointestinal Panel by PCR , Stool     Status: None   Collection Time: 10/31/21  8:23 AM   Specimen: STOOL  Result Value Ref Range Status   Campylobacter species NOT DETECTED NOT DETECTED Final   Plesimonas shigelloides NOT DETECTED NOT DETECTED Final   Salmonella species NOT DETECTED NOT DETECTED Final   Yersinia enterocolitica NOT DETECTED NOT DETECTED Final    Vibrio species NOT DETECTED NOT DETECTED Final   Vibrio cholerae NOT DETECTED NOT DETECTED Final   Enteroaggregative E coli (EAEC) NOT DETECTED NOT DETECTED Final   Enteropathogenic E coli (EPEC) NOT DETECTED NOT DETECTED Final   Enterotoxigenic E coli (ETEC) NOT DETECTED NOT DETECTED Final   Shiga like toxin producing E coli (STEC) NOT DETECTED NOT DETECTED Final   Shigella/Enteroinvasive E coli (EIEC) NOT DETECTED NOT DETECTED Final   Cryptosporidium NOT DETECTED NOT DETECTED Final   Cyclospora cayetanensis NOT DETECTED NOT DETECTED Final   Entamoeba histolytica NOT DETECTED NOT DETECTED Final   Giardia lamblia NOT DETECTED NOT DETECTED Final   Adenovirus F40/41 NOT DETECTED NOT DETECTED Final   Astrovirus NOT DETECTED NOT DETECTED Final   Norovirus GI/GII NOT DETECTED NOT DETECTED Final   Rotavirus A NOT DETECTED NOT DETECTED  Final   Sapovirus (I, II, IV, and V) NOT DETECTED NOT DETECTED Final    Comment: Performed at [facility], [place], [place] [address]  C Difficile Quick Screen w PCR reflex     Status: None   Collection Time: 10/31/21  8:24 AM   Specimen: STOOL  Result Value Ref Range Status   C Diff antigen NEGATIVE NEGATIVE Final   C Diff toxin NEGATIVE NEGATIVE Final   C Diff interpretation No C. difficile detected.  Final    Comment: Performed at [facility], [address], [place], [place] [address]    Radiology Studies: CT ABDOMEN PELVIS WO CONTRAST  Result Date: 11/04/2021 Shirline Frees     11/04/2021  5:42 PM Pt refused CT Abe/Pel scan    Scheduled Meds:  budesonide  0.25 mg Nebulization BID   cholestyramine  4 g Oral Daily   diphenoxylate-atropine  2 tablet Oral Q6H   feeding supplement  237 mL Oral BID BM   heparin  5,000 Units Subcutaneous Q8H   multivitamin with minerals  1 tablet Oral Daily   pantoprazole  40 mg Oral Daily   Continuous Infusions:  sodium chloride     sodium phosphate 15 mmol in dextrose 5 % 250 mL infusion  15 mmol (11/06/21 1042)    LOS: 7 days   Raiford Noble, DO [facility] Available via Epic secure chat 7am-7pm After these hours, please refer to coverage provider listed on [email or web address] 11/06/2021, 3:46 PM

## 2021-11-06 NOTE — Progress Notes (Signed)
   11/06/21 0900  Vitals  Temp (!) 97.5 F (36.4 C)  Temp Source Oral  BP 118/73  MAP (mmHg) 86  BP Location Left Arm  BP Method Automatic  Patient Position (if appropriate) Lying  Pulse Rate (!) 114  Pulse Rate Source Dinamap  Resp 20  Level of Consciousness  Level of Consciousness Alert  MEWS COLOR  MEWS Score Color Yellow  Oxygen Therapy  SpO2 98 %  O2 Device Room Air  Pain Assessment  Pain Scale 0-10  Pain Score 0  Complaints & Interventions  Complains of Diarrhea  Diarrhea relieved by Antidiarrheal  Glasgow Coma Scale  Eye Opening 4  Best Verbal Response (NON-intubated) 5  Best Motor Response 6  Glasgow Coma Scale Score 15  MEWS Score  MEWS Temp 0  MEWS Systolic 0  MEWS Pulse 2  MEWS RR 0  MEWS LOC 0  MEWS Score 2  Provider Notification  Provider Name/Title Dr. Alfredia Ferguson  Date Provider Notified 11/06/21  Time Provider Notified 1049  Method of Notification Page  Notification Reason Other (Comment)

## 2021-11-07 DIAGNOSIS — R197 Diarrhea, unspecified: Secondary | ICD-10-CM | POA: Diagnosis not present

## 2021-11-07 DIAGNOSIS — E876 Hypokalemia: Secondary | ICD-10-CM | POA: Diagnosis not present

## 2021-11-07 DIAGNOSIS — I10 Essential (primary) hypertension: Secondary | ICD-10-CM | POA: Diagnosis not present

## 2021-11-07 LAB — CBC WITH DIFFERENTIAL/PLATELET
Abs Immature Granulocytes: 0.6 10*3/uL — ABNORMAL HIGH (ref 0.00–0.07)
Band Neutrophils: 53 %
Basophils Absolute: 0 10*3/uL (ref 0.0–0.1)
Basophils Relative: 0 %
Eosinophils Absolute: 0.1 10*3/uL (ref 0.0–0.5)
Eosinophils Relative: 1 %
HCT: 33.3 % — ABNORMAL LOW (ref 36.0–46.0)
Hemoglobin: 11.4 g/dL — ABNORMAL LOW (ref 12.0–15.0)
Lymphocytes Relative: 4 %
Lymphs Abs: 0.3 10*3/uL — ABNORMAL LOW (ref 0.7–4.0)
MCH: 31.8 pg (ref 26.0–34.0)
MCHC: 34.2 g/dL (ref 30.0–36.0)
MCV: 92.8 fL (ref 80.0–100.0)
Metamyelocytes Relative: 7 %
Monocytes Absolute: 0.8 10*3/uL (ref 0.1–1.0)
Monocytes Relative: 12 %
Myelocytes: 1 %
Neutro Abs: 5.1 10*3/uL (ref 1.7–7.7)
Neutrophils Relative %: 21 %
Platelets: 255 10*3/uL (ref 150–400)
Promyelocytes Relative: 1 %
RBC: 3.59 MIL/uL — ABNORMAL LOW (ref 3.87–5.11)
RDW: 22.9 % — ABNORMAL HIGH (ref 11.5–15.5)
WBC: 6.9 10*3/uL (ref 4.0–10.5)
nRBC: 0.3 % — ABNORMAL HIGH (ref 0.0–0.2)

## 2021-11-07 LAB — COMPREHENSIVE METABOLIC PANEL
ALT: 17 U/L (ref 0–44)
AST: 19 U/L (ref 15–41)
Albumin: 1.8 g/dL — ABNORMAL LOW (ref 3.5–5.0)
Alkaline Phosphatase: 54 U/L (ref 38–126)
Anion gap: 8 (ref 5–15)
BUN: 19 mg/dL (ref 8–23)
CO2: 23 mmol/L (ref 22–32)
Calcium: 7.3 mg/dL — ABNORMAL LOW (ref 8.9–10.3)
Chloride: 101 mmol/L (ref 98–111)
Creatinine, Ser: 0.69 mg/dL (ref 0.44–1.00)
GFR, Estimated: >60 mL/min (ref >60)
Glucose, Bld: 97 mg/dL (ref 70–99)
Potassium: 4.2 mmol/L (ref 3.5–5.1)
Sodium: 132 mmol/L — ABNORMAL LOW (ref 135–145)
Total Bilirubin: 1 mg/dL (ref 0.3–1.2)
Total Protein: 3.8 g/dL — ABNORMAL LOW (ref 6.5–8.1)

## 2021-11-07 LAB — PHOSPHORUS: Phosphorus: 2.9 mg/dL (ref 2.5–4.6)

## 2021-11-07 LAB — MAGNESIUM: Magnesium: 1.9 mg/dL (ref 1.7–2.4)

## 2021-11-07 MED ORDER — KATE FARMS STANDARD 1.4 PO LIQD
325.0000 mL | Freq: Every day | ORAL | Status: DC
  Administered 2021-11-07: 325 mL via ORAL

## 2021-11-07 MED ORDER — BOOST PLUS PO LIQD
237.0000 mL | Freq: Three times a day (TID) | ORAL | Status: DC
  Administered 2021-11-07 – 2021-11-14 (×13): 237 mL via ORAL
  Filled 2021-11-07 (×25): qty 237

## 2021-11-07 MED ORDER — KATE FARMS STANDARD 1.4 PO LIQD: 325.0000 mL | Freq: Two times a day (BID) | ORAL | Status: DC

## 2021-11-07 NOTE — Care Management Important Message (Signed)
Important Message  Patient Details  Name: Regina Knox MRN: [id number] Date of Birth: [birth year]   Medicare Important Message Given:  Yes     Tommy Medal 11/07/2021, 11:28 AM

## 2021-11-07 NOTE — Progress Notes (Addendum)
Initial Nutrition Assessment  DOCUMENTATION CODES:   Not applicable  INTERVENTION:   D/C Ensure   Boost Plus TID; pt drinks vanilla Boost Plus at home. We only carry chocolate Boost Plus and pt is willing to try   Trial of [employer] [place] Daily  Daily weights   NUTRITION DIAGNOSIS:   Increased nutrient needs related to acute illness, cancer and cancer related treatments, chronic illness as evidenced by estimated needs. Ongoing.   GOAL:   Patient will meet greater than or equal to 90% of their needs Progressing   MONITOR:   PO intake, Supplement acceptance, Diet advancement, Labs, Weight trends, I & O's  REASON FOR ASSESSMENT:   Consult Assessment of nutrition requirement/status  ASSESSMENT:   76 y.o. female with medical history of anemia, asthma, HLD, GERD, HTN, colorectal cancer undergoing radiation with concurrent xeloda. She presented to the ED due to intractable diarrhea thought to be 2/2 recent radiation.  Pt continues to have diarrhea, per meal completions she is consuming 25% of her meals. Refusing Ensure, will d/c and try [employer].   Medications reviewed and include: questran daily, lomotil every 6 hours, MVI with minerals, protonix   Labs reviewed: Na 132 Vitamin B12 1282    Diet Order:   Diet Order             DIET SOFT Room service appropriate? Yes; Fluid consistency: Thin  Diet effective now                   EDUCATION NEEDS:   Education needs have been addressed  Skin:  Skin Assessment: Reviewed RN Assessment  Last BM:  9/16 (type 7 x1, medium amount)  Height:   Ht Readings from Last 1 Encounters:  10/30/21 '5\' 3"'$  (1.6 m)    Weight:   Wt Readings from Last 1 Encounters:  10/30/21 54.4 kg    Ideal Body Weight:  52.3 kg  BMI:  Body mass index is 21.26 kg/m.  Estimated Nutritional Needs:   Kcal:  1650-1850 kcal  Protein:  80-95 grams  Fluid:  >/= 1.8 L/day  Lockie Pares., RD, LDN, CNSC See AMiON for contact information

## 2021-11-07 NOTE — Progress Notes (Signed)
Patient up and down throughout the night to the bed side commode, she is showing little improvement with bowel patterns. She is refusing heparin and ensure supplements, night MD Adefeso aware.

## 2021-11-07 NOTE — Plan of Care (Signed)
  Problem: Acute Rehab PT Goals(only PT should resolve) Goal: Pt Will Transfer Bed To Chair/Chair To Bed Outcome: Progressing Flowsheets (Taken 11/07/2021 1100) Pt will Transfer Bed to Chair/Chair to Bed: with supervision Goal: Pt Will Ambulate Outcome: Progressing Flowsheets (Taken 11/07/2021 1100) Pt will Ambulate:  > 125 feet  with supervision  with least restrictive assistive device Goal: Pt/caregiver will Perform Home Exercise Program Outcome: Progressing Flowsheets (Taken 11/07/2021 1100) Pt/caregiver will Perform Home Exercise Program:  For increased strengthening  For improved balance  Independently  11:00 AM, 11/07/21 Mearl Latin PT, DPT Physical Therapist at [facility]

## 2021-11-07 NOTE — Progress Notes (Signed)
PROGRESS NOTE    Regina Knox  ZOX:[id number] DOB: 11-Jan-1946 DOA: 10/30/2021 PCP: Chevis Pretty, FNP   Brief Narrative:  Regina Knox is a 76 y.o. Caucasian  female with a history of anemia, asthma, hyperlipidemia, GERD, hypertension, colorectal cancer. Patient presented secondary to intractable diarrhea likely secondary to recent radiation therapy. Diarrhea is slowly improving but still continues to have a significant amount. Getting Supportive Care and Antidiarrheals but she continues appear weak and is remains tachycardic.  Bowel movements are slowly improving but given the persistence have consulted GI for further evaluation recommendations and they agree we will be more aggressive with scheduled Lomotil every 6 hours and recommending avoiding dairy products and continuing low soft diet encouraged considering a trial of cholestyramine if not improved. Continues to be very weak and continues to have a lot of diarrhea.    Assessment and Plan: Intractable diarrhea -Likely secondary to radiation therapy and resultant radiation enteritis. Afebrile.  -No leukocytosis or leukopenia. No abdominal pain. C. Difficile and GI pathogen panel tests negative.  -Emesis resolved. Diarrhea frequency is improving.  -C/w Supportive care -Continued IV fluids with NS at 75 mL/hr and stopped. Given a 500 mL Bolus yesterday -Soft diet; low fiber -Lomitil 2 tab po 4times Daily prn Diarrhea -Patient refused CT Scan of the Abdomen and Pelvis w/o Contrast that was ordered by Dr. Lonny Prude -Given continued significant Diarrhea gastroenterology was consulted and they are recommending avoiding dairy products and agreed to be more aggressive with the diarrhea treatments and has scheduled Lomotil every 6 hours and recommending a trial of cholestyramine if not improved -Diarrhea slowly improving but very persistent    Rectal cancer -Patient is on Xeloda and receiving radiation treatment.  -Patient is considering  stopping treatments as her quality of life is suffering; she is hoping to speak with medical oncology.  -Her primary oncologist is not available, unfortunately.  -On-call oncology Dr. Grayland Ormond contacted for consult. Patient will follow-up with oncology as an outpatient to discuss further treatment options/recommendations.   PseudoHypocalcemia -Falsely low, secondary to hypoalbuminemia.  -Corrected Ca2+ again is 9.1   Increased nutrient needs -No malnutrition noted by dietitian. Nutrition management complicated by cancer diagnosis and treatments with resultant adverse effects. Nutrition Status: Nutrition Problem: Increased nutrient needs Etiology: acute illness, cancer and cancer related treatments, chronic illness Signs/Symptoms: estimated needs Interventions: MVI, Carn[employer] Instant Breakfast -Appreciate further Dietitian evaluation   Hypokalemia -Likely secondary to diarrheal illness. Supplementation given. -Potassium is now 4.2 -Mag Level is now 1.9 -Continue potassium supplementation as needed -Patient refusing Telemetry Monitoring  -Repeat CMP in the AM   Primary Hypertension -Patient is on olmesartan-hydrochlorothiazide as an outpatient which were held on admission secondary to soft blood pressure and hypovolemia.  -Blood pressure is currently normotensive and was 111/73.   Peripheral Edema -In setting of hypoalbuminemia and IV fluids. Oral intake improved. -Discontinued IV fluids yesterday but resume this AM given Hyponatremia and continued Diarrhea -Lasix 20 mg IV x1 a few days ago -Will need TED hose and Elevation of Extremities.  Has also been ordered   Normocytic Anemia -Patient's Hgb/Hct went from 10.8/32.2 -> 10.6/31.2 -> 12.4/36.8 is now 11.4/33.3 -Checked Anemia Panel showed an iron level of 32, UIBC 138, TIBC 170, saturation of 90%, ferritin level 59, folate 19.3, vitamin B12 1282 -Continue to Monitor for S/Sx of Bleeding; No overt bleeding noted -Repeat CBC in  the AM    Hypophosphatemia -Patient's phosphorus level was 2.3 and improved to 2.9 -Replete with IV K-Phos 15  mmol yesterday -Continue monitor and trend and repeat Phos level in a.m.   Hyponatremia -Mild and sodium went from 135 -> 133 -> 132 -Replete as above -Continue to monitor and trend and repeat CMP in a.m.   Hyperbilirubinemia -Mild and trended up as T. bili went from 0.9 -> 1.0 -> 1.5 and likely reactive and improved to 1.0 -Continue to monitor and trend and repeat CMP in the a.m.   Sinus tachycardia, mildly improved  -Patient's heart rate was elevated -Likely physiologic in the setting of above -Continue with IV fluid hydration bolus of 500 mL -Patient is refusing telemetry  DVT prophylaxis: Place TED hose Start: 11/06/21 1034 heparin injection 5,000 Units Start: 10/30/21 2215 SCDs Start: 10/30/21 2209    Code Status: Full Code Family Communication: No family present at bedside  Disposition Plan:  Level of care: Telemetry Status is: Inpatient Remains inpatient appropriate because: Continues to have significant diarrhea and needs GI Clearance prior to Safe D/C disposition   Consultants:  Oncology Gastroenterology  Procedures:  None  Antimicrobials:  Anti-infectives (From admission, onward)    None       Subjective: Seen and examined at bedside.  She was doing a little bit better but not tremendously.  Still having quite a bit of diarrhea.  Feels very fatigued and weak.  No lightheadedness or dizziness.  No other concerns or complaints at this time.  Objective: Vitals:   11/06/21 2118 11/07/21 0510 11/07/21 0724 11/07/21 1415  BP: 109/72 112/64  111/73  Pulse: (!) 110 82  (!) 116  Resp: '18 20  19  '$ Temp: 98 F (36.7 C) 97.9 F (36.6 C)  97.9 F (36.6 C)  TempSrc: Oral Oral  Oral  SpO2: 97% 98% 98% 98%  Weight:      Height:        Intake/Output Summary (Last 24 hours) at 11/07/2021 1631 Last data filed at 11/07/2021 1300 Gross per 24 hour   Intake 997.76 ml  Output --  Net 997.76 ml   Filed Weights   10/30/21 1147  Weight: 54.4 kg   Examination: Physical Exam:  Constitutional: Thin chronically ill-appearing Caucasian female currently no acute distress appears very fatigued though Respiratory: Diminished to auscultation bilaterally with coarse breath sounds, no wheezing, rales, rhonchi or crackles. Normal respiratory effort and patient is not tachypenic. No accessory muscle use.  Unlabored breathing Cardiovascular: Slightly tachycardic rate but regular rhythm.  Has 1+ lower extremity edema Abdomen: Soft, mildly-tender, non-distended. Bowel sounds positive.  GU: Deferred. Musculoskeletal: No clubbing / cyanosis of digits/nails. No joint deformity upper and lower extremities. Skin: No rashes, lesions, ulcers on limited skin evaluation. No induration; Warm and dry.  Neurologic: CN 2-12 grossly intact with no focal deficits.  Romberg sign and cerebellar reflexes not assessed.  Psychiatric: Normal judgment and insight. Alert and oriented x 3. Normal mood and appropriate affect.   Data Reviewed: I have personally reviewed following labs and imaging studies  CBC: Recent Labs  Lab 11/05/21 0414 11/06/21 0621 11/07/21 0256  WBC 5.3 6.3 6.9  NEUTROABS  --  5.4 5.1  HGB 10.6* 12.4 11.4*  HCT 31.2* 36.8 33.3*  MCV 92.6 93.4 92.8  PLT 245 280 992   Basic Metabolic Panel: Recent Labs  Lab 11/02/21 0438 11/03/21 0445 11/05/21 0414 11/05/21 0847 11/06/21 0621 11/07/21 0256  NA 134* 135 134*  --  133* 132*  K 3.4* 3.9 2.6*  --  4.8 4.2  CL 106 104 99  --  103 101  CO2 21* 24 27  --  22 23  GLUCOSE 99 84 104*  --  119* 97  BUN '13 13 16  '$ --  18 19  CREATININE 0.58 0.60 0.60  --  0.68 0.69  CALCIUM 7.3* 7.5* 7.0*  --  7.5* 7.3*  MG  --  1.8  --  1.7 1.9 1.9  PHOS  --   --   --  2.6 2.3* 2.9   GFR: Estimated Creatinine Clearance: 47.9 mL/min (by C-G formula based on SCr of 0.69 mg/dL). Liver Function Tests: Recent  Labs  Lab 11/05/21 0847 11/06/21 0621 11/07/21 0256  AST '21 30 19  '$ ALT '15 19 17  '$ ALKPHOS 43 52 54  BILITOT 1.0 1.5* 1.0  PROT 3.5* 4.2* 3.8*  ALBUMIN 1.7* 2.0* 1.8*   No results for input(s): "LIPASE", "AMYLASE" in the last 168 hours. No results for input(s): "AMMONIA" in the last 168 hours. Coagulation Profile: No results for input(s): "INR", "PROTIME" in the last 168 hours. Cardiac Enzymes: No results for input(s): "CKTOTAL", "CKMB", "CKMBINDEX", "TROPONINI" in the last 168 hours. BNP (last 3 results) No results for input(s): "PROBNP" in the last 8760 hours. HbA1C: No results for input(s): "HGBA1C" in the last 72 hours. CBG: No results for input(s): "GLUCAP" in the last 168 hours. Lipid Profile: No results for input(s): "CHOL", "HDL", "LDLCALC", "TRIG", "CHOLHDL", "LDLDIRECT" in the last 72 hours. Thyroid Function Tests: No results for input(s): "TSH", "T4TOTAL", "FREET4", "T3FREE", "THYROIDAB" in the last 72 hours. Anemia Panel: Recent Labs    11/06/21 0621  VITAMINB12 1,282*  FOLATE 19.3  FERRITIN 59  TIBC 170*  IRON 32  RETICCTPCT 4.7*   Sepsis Labs: No results for input(s): "PROCALCITON", "LATICACIDVEN" in the last 168 hours.  Recent Results (from the past 240 hour(s))  Gastrointestinal Panel by PCR , Stool     Status: None   Collection Time: 10/31/21  8:23 AM   Specimen: STOOL  Result Value Ref Range Status   Campylobacter species NOT DETECTED NOT DETECTED Final   Plesimonas shigelloides NOT DETECTED NOT DETECTED Final   Salmonella species NOT DETECTED NOT DETECTED Final   Yersinia enterocolitica NOT DETECTED NOT DETECTED Final   Vibrio species NOT DETECTED NOT DETECTED Final   Vibrio cholerae NOT DETECTED NOT DETECTED Final   Enteroaggregative E coli (EAEC) NOT DETECTED NOT DETECTED Final   Enteropathogenic E coli (EPEC) NOT DETECTED NOT DETECTED Final   Enterotoxigenic E coli (ETEC) NOT DETECTED NOT DETECTED Final   Shiga like toxin producing E coli  (STEC) NOT DETECTED NOT DETECTED Final   Shigella/Enteroinvasive E coli (EIEC) NOT DETECTED NOT DETECTED Final   Cryptosporidium NOT DETECTED NOT DETECTED Final   Cyclospora cayetanensis NOT DETECTED NOT DETECTED Final   Entamoeba histolytica NOT DETECTED NOT DETECTED Final   Giardia lamblia NOT DETECTED NOT DETECTED Final   Adenovirus F40/41 NOT DETECTED NOT DETECTED Final   Astrovirus NOT DETECTED NOT DETECTED Final   Norovirus GI/GII NOT DETECTED NOT DETECTED Final   Rotavirus A NOT DETECTED NOT DETECTED Final   Sapovirus (I, II, IV, and V) NOT DETECTED NOT DETECTED Final    Comment: Performed at [facility], [address] [place], [place] [address]  C Difficile Quick Screen w PCR reflex     Status: None   Collection Time: 10/31/21  8:24 AM   Specimen: STOOL  Result Value Ref Range Status   C Diff antigen NEGATIVE NEGATIVE Final   C Diff toxin NEGATIVE NEGATIVE Final  C Diff interpretation No C. difficile detected.  Final    Comment: Performed at [facility], [address] [place], [place] [address]    Radiology Studies: No results found.  Scheduled Meds:  budesonide  0.25 mg Nebulization BID   cholestyramine  4 g Oral Daily   diphenoxylate-atropine  2 tablet Oral Q6H   feeding supplement (KATE FARMS STANDARD 1.4)  325 mL Oral Daily   heparin  5,000 Units Subcutaneous Q8H   lactose free nutrition  237 mL Oral TID WC   multivitamin with minerals  1 tablet Oral Daily   pantoprazole  40 mg Oral Daily   Continuous Infusions:   LOS: 8 days   Raiford Noble, DO [facility] Available via Epic secure chat 7am-7pm After these hours, please refer to coverage provider listed on [email or web address] 11/07/2021, 4:31 PM

## 2021-11-07 NOTE — Evaluation (Signed)
Physical Therapy Evaluation Patient Details Name: Regina Knox MRN: [id number] DOB: 12-17-1945 Today's Date: 11/07/2021  History of Present Illness  Regina Knox is a 76 y.o. female with medical history significant of anemia, asthma, hyperlipidemia, GERD, hypertension, colorectal cancer currently getting radiation, and more presents to the ED with a chief complaint of intractable diarrhea.  Patient reports that she has had diarrhea for 2 weeks.  Its associated with the radiation, which she started 3 weeks ago for colorectal cancer.  Patient reports that since that time she has had 7-8 watery bowel movements per day.  They are nonbloody.  She does have associated abdominal cramping that is relieved after she has a bowel movement.  Patient reports that anything she eats or drinks seems to go straight through her and she has diarrhea immediately after.  She reports it is even noticeable with the IV fluids.  She has taken Lomotil and Imodium with no relief at home.  She had nausea and vomiting x1 that was nonbloody emesis.  Her last normal meal was day prior to presentation.  She has had a decreased appetite.  Patient does report some dysuria that she attributes to dehydration.  She had some generalized weakness which has been expected with radiation.  Patient has no other complaints at this time.   Clinical Impression  Patient limited for functional mobility as stated below secondary to BLE weakness, fatigue and impaired activity tolerance. Patient does not require assist for bed mobility with HOB elevated. She demonstrates good sitting balance and sitting tolerance at EOB. Patient with mild unsteadiness upon standing and with ambulating which improves with unilateral HHA. Patient with some unsteadiness but no loss of balance with ambulation. Patient educated on using RW upon returning home in order to improve balance and reduce risks for falls. Patient will benefit from continued physical therapy in  hospital and recommended venue below to increase strength, balance, endurance for safe ADLs and gait.        Recommendations for follow up therapy are one component of a multi-disciplinary discharge planning process, led by the attending physician.  Recommendations may be updated based on patient status, additional functional criteria and insurance authorization.  Follow Up Recommendations Home health PT      Assistance Recommended at Discharge PRN  Patient can return home with the following  A little help with walking and/or transfers;A little help with bathing/dressing/bathroom;Assistance with cooking/housework;Help with stairs or ramp for entrance    Equipment Recommendations None recommended by PT  Recommendations for Other Services       Functional Status Assessment Patient has had a recent decline in their functional status and demonstrates the ability to make significant improvements in function in a reasonable and predictable amount of time.     Precautions / Restrictions Precautions Precautions: Fall Restrictions Weight Bearing Restrictions: No      Mobility  Bed Mobility Overal bed mobility: Modified Independent             General bed mobility comments: HOB elevated slightly    Transfers Overall transfer level: Needs assistance Equipment used: 1 person hand held assist Transfers: Sit to/from Stand Sit to Stand: Min guard           General transfer comment: labored, mild unsteady upon standing    Ambulation/Gait Ambulation/Gait assistance: Min guard Gait Distance (Feet): 50 Feet Assistive device: 1 person hand held assist   Gait velocity: decreased     General Gait Details: ambulates with HHA with mild unsteadiness,  no loss of balance  Stairs            Wheelchair Mobility    Modified Rankin (Stroke Patients Only)       Balance Overall balance assessment: Needs assistance Sitting-balance support: No upper extremity supported,  Feet supported Sitting balance-Leahy Scale: Good     Standing balance support: Single extremity supported, During functional activity Standing balance-Leahy Scale: Fair Standing balance comment: good/ fair with unilateral HHA                             Pertinent Vitals/Pain Pain Assessment Pain Assessment: No/denies pain    Home Living Family/patient expects to be discharged to:: Private residence Living Arrangements: Alone Available Help at Discharge: Personal care attendant;Available PRN/intermittently Type of Home: House Home Access: Stairs to enter Entrance Stairs-Rails: Left Entrance Stairs-Number of Steps: 5   Home Layout: One level Home Equipment: Rolling Walker (2 wheels);BSC/3in1;Cane - single point;Cane - quad      Prior Function Prior Level of Function : Independent/Modified Independent             Mobility Comments: community ambulation without AD ADLs Comments: independent, has had aid assist recently     Hand Dominance        Extremity/Trunk Assessment   Upper Extremity Assessment Upper Extremity Assessment: Overall WFL for tasks assessed    Lower Extremity Assessment Lower Extremity Assessment: Generalized weakness    Cervical / Trunk Assessment Cervical / Trunk Assessment: Normal  Communication   Communication: No difficulties  Cognition Arousal/Alertness: Awake/alert Behavior During Therapy: WFL for tasks assessed/performed Overall Cognitive Status: Within Functional Limits for tasks assessed                                          General Comments      Exercises     Assessment/Plan    PT Assessment Patient needs continued PT services  PT Problem List Decreased strength;Decreased mobility;Decreased activity tolerance;Decreased balance       PT Treatment Interventions DME instruction;Therapeutic exercise;Gait training;Balance training;Stair training;Neuromuscular re-education;Functional mobility  training;Therapeutic activities;Patient/family education    PT Goals (Current goals can be found in the Care Plan section)  Acute Rehab PT Goals Patient Stated Goal: return home PT Goal Formulation: With patient Time For Goal Achievement: 11/14/21 Potential to Achieve Goals: Good    Frequency Min 3X/week     Co-evaluation               AM-PAC PT "6 Clicks" Mobility  Outcome Measure Help needed turning from your back to your side while in a flat bed without using bedrails?: None Help needed moving from lying on your back to sitting on the side of a flat bed without using bedrails?: None Help needed moving to and from a bed to a chair (including a wheelchair)?: A Little Help needed standing up from a chair using your arms (e.g., wheelchair or bedside chair)?: A Little Help needed to walk in hospital room?: A Little Help needed climbing 3-5 steps with a railing? : A Little 6 Click Score: 20    End of Session   Activity Tolerance: Patient tolerated treatment well Patient left: in bed;with call bell/phone within reach Nurse Communication: Mobility status PT Visit Diagnosis: Unsteadiness on feet (R26.81);Other abnormalities of gait and mobility (R26.89);Muscle weakness (generalized) (M62.81)    Time: 6387-5643  PT Time Calculation (min) (ACUTE ONLY): 20 min   Charges:   PT Evaluation $PT Eval Low Complexity: 1 Low PT Treatments $Therapeutic Activity: 8-22 mins        10:57 AM, 11/07/21 Mearl Latin PT, DPT Physical Therapist at [facility]

## 2021-11-07 NOTE — Progress Notes (Signed)
Gastroenterology Progress Note   Referring Provider: No ref. provider found Primary Care Physician:  Chevis Pretty, [place] Primary Gastroenterologist:  Dr. Tarri Glenn ([facility])  Patient ID: Regina Knox; [id number]; 03/01/1945    Subjective   Overall doing about the same.  Does report some improvement in her diarrhea.  Has only had about 3 bowel movements overnight and about 3 or so during the day today.  Stools have also been a smaller amount.  Continues to have some urgency but that is not out of the norm for her.  Continues to have a lack of appetite but has been able to tolerate clear Ensure.  Does not have any abdominal pain, nausea, or vomiting.   Objective   Vital signs in last 24 hours Temp:  [97.9 F (36.6 C)-98 F (36.7 C)] 97.9 F (36.6 C) (09/22 1415) Pulse Rate:  [82-116] 116 (09/22 1415) Resp:  [18-20] 19 (09/22 1415) BP: (109-112)/(64-73) 111/73 (09/22 1415) SpO2:  [97 %-98 %] 98 % (09/22 1415) Last BM Date : 11/05/21  Physical Exam General:   Alert and oriented, pleasant Head:  Normocephalic and atraumatic. Eyes:  No icterus, sclera clear. Conjuctiva pink.  Neck:  Supple, without thyromegaly or masses.  Heart:  S1, S2 present, no murmurs noted.  Lungs: Clear to auscultation bilaterally, without wheezing, rales, or rhonchi.  Abdomen:  Bowel sounds present, soft, non-tender, non-distended. No HSM or hernias noted. No rebound or guarding. No masses appreciated  Msk:  Symmetrical without gross deformities. Normal posture. Extremities:  Without clubbing or edema. Neurologic:  Alert and  oriented x4;  grossly normal neurologically. Skin:  Warm and dry, intact without significant lesions.  Psych:  Alert and cooperative. Normal mood and affect.  Intake/Output from previous day: 09/21 0701 - 09/22 0700 In: 822.1 [P.O.:480; IV Piggyback:342.1] Out: -  Intake/Output this shift: Total I/O In: 600 [P.O.:600] Out: -   Lab Results  Recent Labs     11/05/21 0414 11/06/21 0621 11/07/21 0256  WBC 5.3 6.3 6.9  HGB 10.6* 12.4 11.4*  HCT 31.2* 36.8 33.3*  PLT 245 280 255   BMET Recent Labs    11/05/21 0414 11/06/21 0621 11/07/21 0256  NA 134* 133* 132*  K 2.6* 4.8 4.2  CL 99 103 101  CO2 '27 22 23  '$ GLUCOSE 104* 119* 97  BUN '16 18 19  '$ CREATININE 0.60 0.68 0.69  CALCIUM 7.0* 7.5* 7.3*   LFT Recent Labs    11/05/21 0847 11/06/21 0621 11/07/21 0256  PROT 3.5* 4.2* 3.8*  ALBUMIN 1.7* 2.0* 1.8*  AST '21 30 19  '$ ALT '15 19 17  '$ ALKPHOS 43 52 54  BILITOT 1.0 1.5* 1.0  BILIDIR 0.2  --   --   IBILI 0.8  --   --    PT/INR No results for input(s): "LABPROT", "INR" in the last 72 hours. Hepatitis Panel No results for input(s): "HEPBSAG", "HCVAB", "HEPAIGM", "HEPBIGM" in the last 72 hours. C-Diff PCR negative  Studies/Results CT ABDOMEN PELVIS WO CONTRAST  Result Date: 11/04/2021 Shirline Frees     11/04/2021  5:42 PM Pt refused CT Abe/Pel scan    Assessment  76 y.o. female with a history of anemia, asthma, GERD, HTN, and recently diagnosed adenocarcinoma of the rectum in June 2023, currently undergoing chemoradiation with Xeloda/XRT.  She developed acute on chronic diarrhea, weakness approximately 2 weeks into treatment (has completed 19 of 28 planned treatments).  GI consulted for intractable diarrhea.  Acute on chronic diarrhea: Having some  diarrhea at baseline in the setting of near circumferential nonobstructing tumor of the rectum that was diagnosed on colonoscopy in June 2023.  Per patient this was manageable.  Her diarrhea worsened approximately 2 weeks into chemoradiation treatments becoming intractable the last week of treatment.  She presented with significant electrolyte abnormalities, a degree of malnutrition.  It was difficult to quantify how much diarrhea she was having because stools were not all documented.  She was previously receiving Lomotil 2 tablets 4 times daily as needed, and was requesting this every 6  hours.  There is previously mentioned [employer] 3 times daily, however patient stated she was not receiving that and states she was not drinking any nutritional shakes because of her low-carb she does not like the flavors.  She has been on soft diet and was getting some dairy in the form of yogurt.  Diarrhea suspected to be multifactorial in the setting of Xeloda, radiation, and rectal tumor.  Her C. difficile and GI pathogen panel have been negative.  Her potassium has been repleted and is stable at 4.2 today.  Diarrhea has improved with scheduled Lomotil, now only having about 3 bowel movements overnight, and 3 during the day.  Amount of her bowel movement has also improved.  She continues to struggle with appetite, but has been working on drinking clear Ensure.  Per patient and son, she has new surgical consultation planned for 11/24/2021 with [place] surgery.  Plan / Recommendations  Continue scheduled Lomotil 2 tablets every 6 hours Avoid dairy products as much as feasible Nutritional state supplementation with Ensure clear or boost breeze to avoid dairy. Soft/low fiber diet Trial of Questran could be considered if symptoms worsen.  May be difficult as it needs to be dosed apart from other medications.  Other medication should be given 1 hour prior or 4-6 hours after receiving Questran.    LOS: 8 days    11/07/2021, 5:01 PM   Venetia Night, MSN, FNP-BC, AGACNP-BC [facility]

## 2021-11-08 DIAGNOSIS — I10 Essential (primary) hypertension: Secondary | ICD-10-CM | POA: Diagnosis not present

## 2021-11-08 DIAGNOSIS — R197 Diarrhea, unspecified: Secondary | ICD-10-CM | POA: Diagnosis not present

## 2021-11-08 DIAGNOSIS — E876 Hypokalemia: Secondary | ICD-10-CM | POA: Diagnosis not present

## 2021-11-08 DIAGNOSIS — R000 Tachycardia, unspecified: Secondary | ICD-10-CM

## 2021-11-08 LAB — CBC WITH DIFFERENTIAL/PLATELET
Abs Immature Granulocytes: 0 10*3/uL (ref 0.00–0.07)
Band Neutrophils: 16 %
Basophils Absolute: 0 10*3/uL (ref 0.0–0.1)
Basophils Relative: 0 %
Eosinophils Absolute: 0 10*3/uL (ref 0.0–0.5)
Eosinophils Relative: 0 %
HCT: 34.5 % — ABNORMAL LOW (ref 36.0–46.0)
Hemoglobin: 11.8 g/dL — ABNORMAL LOW (ref 12.0–15.0)
Lymphocytes Relative: 1 %
Lymphs Abs: 0.1 10*3/uL — ABNORMAL LOW (ref 0.7–4.0)
MCH: 31.9 pg (ref 26.0–34.0)
MCHC: 34.2 g/dL (ref 30.0–36.0)
MCV: 93.2 fL (ref 80.0–100.0)
Monocytes Absolute: 0.3 10*3/uL (ref 0.1–1.0)
Monocytes Relative: 5 %
Neutro Abs: 6.4 10*3/uL (ref 1.7–7.7)
Neutrophils Relative %: 78 %
Platelets: 246 10*3/uL (ref 150–400)
RBC: 3.7 MIL/uL — ABNORMAL LOW (ref 3.87–5.11)
RDW: 23 % — ABNORMAL HIGH (ref 11.5–15.5)
WBC: 6.8 10*3/uL (ref 4.0–10.5)
nRBC: 0.6 % — ABNORMAL HIGH (ref 0.0–0.2)

## 2021-11-08 LAB — COMPREHENSIVE METABOLIC PANEL
ALT: 16 U/L (ref 0–44)
AST: 18 U/L (ref 15–41)
Albumin: 1.8 g/dL — ABNORMAL LOW (ref 3.5–5.0)
Alkaline Phosphatase: 57 U/L (ref 38–126)
Anion gap: 9 (ref 5–15)
BUN: 20 mg/dL (ref 8–23)
CO2: 22 mmol/L (ref 22–32)
Calcium: 7.3 mg/dL — ABNORMAL LOW (ref 8.9–10.3)
Chloride: 100 mmol/L (ref 98–111)
Creatinine, Ser: 0.74 mg/dL (ref 0.44–1.00)
GFR, Estimated: >60 mL/min (ref >60)
Glucose, Bld: 99 mg/dL (ref 70–99)
Potassium: 4.3 mmol/L (ref 3.5–5.1)
Sodium: 131 mmol/L — ABNORMAL LOW (ref 135–145)
Total Bilirubin: 1 mg/dL (ref 0.3–1.2)
Total Protein: 3.8 g/dL — ABNORMAL LOW (ref 6.5–8.1)

## 2021-11-08 LAB — PHOSPHORUS: Phosphorus: 2.8 mg/dL (ref 2.5–4.6)

## 2021-11-08 LAB — MAGNESIUM: Magnesium: 1.9 mg/dL (ref 1.7–2.4)

## 2021-11-08 MED ORDER — METOPROLOL TARTRATE 25 MG PO TABS
25.0000 mg | ORAL_TABLET | Freq: Two times a day (BID) | ORAL | Status: DC
  Administered 2021-11-08 – 2021-11-11 (×4): 25 mg via ORAL
  Filled 2021-11-08 (×8): qty 1

## 2021-11-08 MED ORDER — FUROSEMIDE 10 MG/ML IJ SOLN
20.0000 mg | Freq: Once | INTRAMUSCULAR | Status: AC
  Administered 2021-11-08: 20 mg via INTRAVENOUS
  Filled 2021-11-08: qty 2

## 2021-11-08 NOTE — Progress Notes (Signed)
Patient rested on and off. Still having diarrhea. Lomotil given as ordered.

## 2021-11-08 NOTE — Progress Notes (Signed)
Still with nonbloody loose stools.  8  in the past 24 hours; 4 overnight per patient.  Essentially no abdominal pain.  Lomotil and Questran on board  Vital signs in last 24 hours: Temp:  [97.6 F (36.4 C)-98.4 F (36.9 C)] 98.4 F (36.9 C) (09/23 0459) Pulse Rate:  [105-116] 105 (09/23 0459) Resp:  [18-19] 18 (09/23 0459) BP: (111-121)/(50-73) 112/50 (09/23 0459) SpO2:  [95 %-98 %] 95 % (09/23 0807) Weight:  [61 kg] 61 kg (09/23 0718) Last BM Date : 11/07/21 General:   Alert,   pleasant and cooperative in NAD Abdomen: Nondistended.  Positive bowel sounds soft very minimal left lower quadrant tenderness to palpation.   Extremities:  Without clubbing or edema.    Intake/Output from previous day: 09/22 0701 - 09/23 0700 In: 600 [P.O.:600] Out: -  Intake/Output this shift: No intake/output data recorded.  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  Presenting with recalcitrant nonbloody diarrhea temporally related to chemo/XRT.  Stool studies including GIP and C. difficile negative.  I suspect she does have a proctocolitis related to chemo/radiation. The tumor was concentric but I doubt with treatment she has developed rectal narrowing producing a scenario of spurious diarrhea -but less likely.   Recommendations:  Adequate hydration.  Residue diet.  Continue Questran daily and scheduled Lomotil

## 2021-11-08 NOTE — Progress Notes (Signed)
PROGRESS NOTE    Regina Knox  WFU:[id number] DOB: January 05, 1946 DOA: 10/30/2021 PCP: Chevis Pretty, FNP   Brief Narrative:  Regina Knox is a 76 y.o. Caucasian  female with a history of anemia, asthma, hyperlipidemia, GERD, hypertension, colorectal cancer. Patient presented secondary to intractable diarrhea likely secondary to recent radiation therapy. Diarrhea is slowly improving but still continues to have a significant amount. Getting Supportive Care and Antidiarrheals but she continues appear weak and is remains tachycardic.  Bowel movements are slowly improving but given the persistence have consulted GI for further evaluation recommendations and they agree we will be more aggressive with scheduled Lomotil every 6 hours and recommending avoiding dairy products and continuing low soft diet encouraged considering a trial of cholestyramine if not improved. Continues to be very weak and continues to have a lot of diarrhea and GI has been consulted for assistance and they are recommending scheduled Lomotil as well as Questran now.  Patient remains tachycardic so we will initiate a low-dose beta-blocker and uptitrate.  If she remains tachycardic given that she has a history of tachycardia will need to consult cardiology for further assistance.   Assessment and Plan: Intractable diarrhea -Likely secondary to radiation therapy and resultant radiation enteritis. Afebrile.  -No leukocytosis or leukopenia. No abdominal pain. C. Difficile and GI pathogen panel tests negative.  -Emesis resolved. Diarrhea frequency is improving but still having quite a bit.  -C/w Supportive care -IV fluid hydration has now stopped -Soft diet; low fiber -Lomitil 2 tab po 4times Daily prn Diarrhea -Patient refused CT Scan of the Abdomen and Pelvis w/o Contrast that was ordered by Dr. Lonny Prude -Given continued significant Diarrhea gastroenterology was consulted and they are recommending avoiding dairy products and  agreed to be more aggressive with the diarrhea treatments and has scheduled Lomotil every 6 hours and now has initiated Questran -Diarrhea slowly improving but very persistent    Rectal cancer -Patient is on Xeloda and receiving radiation treatment.  -Patient is considering stopping treatments as her quality of life is suffering; she is hoping to speak with medical oncology.  -Her primary oncologist is not available, unfortunately.  -On-call oncology Dr. Grayland Ormond contacted for consult. Patient will follow-up with oncology as an outpatient to discuss further treatment options/recommendations.   PseudoHypocalcemia -Falsely low, secondary to hypoalbuminemia.  -Corrected Ca2+ again is 9.1 with her calcium being 7.3 and her albumin 1.8   Increased nutrient needs -No malnutrition noted by dietitian. Nutrition management complicated by cancer diagnosis and treatments with resultant adverse effects. Nutrition Status: Nutrition Problem: Increased nutrient needs Etiology: acute illness, cancer and cancer related treatments, chronic illness Signs/Symptoms: estimated needs Interventions: MVI, Carn[employer] Instant Breakfast -Appreciate further Dietitian evaluation   Hypokalemia -Likely secondary to diarrheal illness. Supplementation given. -Potassium is now 4.3 -Mag Level is now 1.9 -Continue potassium supplementation as needed -Patient refusing Telemetry Monitoring  -Repeat CMP in the AM    Primary Hypertension -Patient is on olmesartan-hydrochlorothiazide as an outpatient which were held on admission secondary to soft blood pressure and hypovolemia.  -Blood pressure is currently normotensive and was 110/79.   Peripheral Edema -In setting of hypoalbuminemia and IV fluids. Oral intake improved. -Discontinued IV fluids yesterday but resume this AM given Hyponatremia and continued Diarrhea -Lasix 20 mg IV x1 a few days ago -Will need TED hose and Elevation of Extremities.  Have ordered TED hose  and still has not been delivered and have asked the nurse to provide it to her   Normocytic Anemia -Patient's  Hgb/Hct went from 10.8/32.2 -> 10.6/31.2 -> 12.4/36.8 -> 11.4/33.3 -> 11.8/34.5 -Checked Anemia Panel showed an iron level of 32, UIBC 138, TIBC 170, saturation of 90%, ferritin level 59, folate 19.3, vitamin B12 1282 -Continue to Monitor for S/Sx of Bleeding; No overt bleeding noted -Repeat CBC in the AM    Hypophosphatemia -Patient's phosphorus level is now 2.8 -Continue monitor and trend and repeat Phos level in a.m.   Hyponatremia -Mild and sodium went from 135 -> 133 -> 132 -> 131 -Replete as above -Continue to monitor and trend and repeat CMP in a.m.   Hyperbilirubinemia -Mild and trended up as T. bili went from 0.9 -> 1.0 -> 1.5 and likely reactive and improved to 1.0 x2 -Continue to monitor and trend and repeat CMP in the a.m.   Sinus Tachycardia -Patient's heart rate was elevated and remains persistently elevated -Likely physiologic in the setting of above and in the setting of her diarrhea but she does have a history of tachycardia -Continue with IV fluid hydration bolus of 500 mL -Patient is refusing telemetry -We will order metoprolol 25 mg p.o. twice daily and if necessary may need to call cardiology for further evaluation  DVT prophylaxis: Place TED hose Start: 11/06/21 1034 heparin injection 5,000 Units Start: 10/30/21 2215 SCDs Start: 10/30/21 2209    Code Status: Full Code Family Communication: No family currently at bedside  Disposition Plan:  Level of care: Telemetry Status is: Inpatient Remains inpatient appropriate because: Continues to have significant amount of diarrhea and has sinus tachycardia and appears extremely weak   Consultants:  Gastroenterology  Procedures:  As listed above  Antimicrobials:  Anti-infectives (From admission, onward)    None       Subjective: Seen and examined at bedside and she is still feeling very  fatigued and states that her heart rate was still very high even at rest and states that sometimes she feels it.  She states that she does have a history of tachycardia in her early 43s.  She denies any lightheadedness or dizziness but just feels very worn out and diarrhea persists.  Objective: Vitals:   11/08/21 0459 11/08/21 0718 11/08/21 0807 11/08/21 1236  BP: (!) 112/50   110/79  Pulse: (!) 105   (!) 115  Resp: 18   19  Temp: 98.4 F (36.9 C)   97.8 F (36.6 C)  TempSrc:      SpO2: 98%  95% 97%  Weight:  61 kg    Height:        Intake/Output Summary (Last 24 hours) at 11/08/2021 1627 Last data filed at 11/08/2021 1300 Gross per 24 hour  Intake 360 ml  Output --  Net 360 ml   Filed Weights   10/30/21 1147 11/08/21 0718  Weight: 54.4 kg 61 kg   Examination: Physical Exam:  Constitutional: WN/WD chronically ill-appearing Caucasian female currently wearing very fatigued Respiratory: Diminished to auscultation bilaterally, no wheezing, rales, rhonchi or crackles. Normal respiratory effort and patient is not tachypenic. No accessory muscle use.  Unlabored breathing Cardiovascular: RRR, no murmurs / rubs / gallops. S1 and S2 auscultated.  Has 1+ lower extremity pitting edema Abdomen: Soft, non-tender, non-distended. Bowel sounds positive.  GU: Deferred. Musculoskeletal: No clubbing / cyanosis of digits/nails. No joint deformity upper and lower extremities. Skin: No rashes, lesions, ulcers on limited skin evaluation. No induration; Warm and dry.  Neurologic: CN 2-12 grossly intact with no focal deficits. Romberg sign and cerebellar reflexes not assessed.  Psychiatric: Normal judgment  and insight. Alert and oriented x 3. Normal mood and appropriate affect.   Data Reviewed: I have personally reviewed following labs and imaging studies  CBC: Recent Labs  Lab 11/05/21 0414 11/06/21 0621 11/07/21 0256 11/08/21 0534  WBC 5.3 6.3 6.9 6.8  NEUTROABS  --  5.4 5.1 6.4  HGB 10.6*  12.4 11.4* 11.8*  HCT 31.2* 36.8 33.3* 34.5*  MCV 92.6 93.4 92.8 93.2  PLT 245 280 255 720   Basic Metabolic Panel: Recent Labs  Lab 11/03/21 0445 11/05/21 0414 11/05/21 0847 11/06/21 0621 11/07/21 0256 11/08/21 0534  NA 135 134*  --  133* 132* 131*  K 3.9 2.6*  --  4.8 4.2 4.3  CL 104 99  --  103 101 100  CO2 24 27  --  '22 23 22  '$ GLUCOSE 84 104*  --  119* 97 99  BUN 13 16  --  '18 19 20  '$ CREATININE 0.60 0.60  --  0.68 0.69 0.74  CALCIUM 7.5* 7.0*  --  7.5* 7.3* 7.3*  MG 1.8  --  1.7 1.9 1.9 1.9  PHOS  --   --  2.6 2.3* 2.9 2.8   GFR: Estimated Creatinine Clearance: 47.9 mL/min (by C-G formula based on SCr of 0.74 mg/dL). Liver Function Tests: Recent Labs  Lab 11/05/21 0847 11/06/21 0621 11/07/21 0256 11/08/21 0534  AST '21 30 19 18  '$ ALT '15 19 17 16  '$ ALKPHOS 43 52 54 57  BILITOT 1.0 1.5* 1.0 1.0  PROT 3.5* 4.2* 3.8* 3.8*  ALBUMIN 1.7* 2.0* 1.8* 1.8*   No results for input(s): "LIPASE", "AMYLASE" in the last 168 hours. No results for input(s): "AMMONIA" in the last 168 hours. Coagulation Profile: No results for input(s): "INR", "PROTIME" in the last 168 hours. Cardiac Enzymes: No results for input(s): "CKTOTAL", "CKMB", "CKMBINDEX", "TROPONINI" in the last 168 hours. BNP (last 3 results) No results for input(s): "PROBNP" in the last 8760 hours. HbA1C: No results for input(s): "HGBA1C" in the last 72 hours. CBG: No results for input(s): "GLUCAP" in the last 168 hours. Lipid Profile: No results for input(s): "CHOL", "HDL", "LDLCALC", "TRIG", "CHOLHDL", "LDLDIRECT" in the last 72 hours. Thyroid Function Tests: No results for input(s): "TSH", "T4TOTAL", "FREET4", "T3FREE", "THYROIDAB" in the last 72 hours. Anemia Panel: Recent Labs    11/06/21 0621  VITAMINB12 1,282*  FOLATE 19.3  FERRITIN 59  TIBC 170*  IRON 32  RETICCTPCT 4.7*   Sepsis Labs: No results for input(s): "PROCALCITON", "LATICACIDVEN" in the last 168 hours.  Recent Results (from the past  240 hour(s))  Gastrointestinal Panel by PCR , Stool     Status: None   Collection Time: 10/31/21  8:23 AM   Specimen: STOOL  Result Value Ref Range Status   Campylobacter species NOT DETECTED NOT DETECTED Final   Plesimonas shigelloides NOT DETECTED NOT DETECTED Final   Salmonella species NOT DETECTED NOT DETECTED Final   Yersinia enterocolitica NOT DETECTED NOT DETECTED Final   Vibrio species NOT DETECTED NOT DETECTED Final   Vibrio cholerae NOT DETECTED NOT DETECTED Final   Enteroaggregative E coli (EAEC) NOT DETECTED NOT DETECTED Final   Enteropathogenic E coli (EPEC) NOT DETECTED NOT DETECTED Final   Enterotoxigenic E coli (ETEC) NOT DETECTED NOT DETECTED Final   Shiga like toxin producing E coli (STEC) NOT DETECTED NOT DETECTED Final   Shigella/Enteroinvasive E coli (EIEC) NOT DETECTED NOT DETECTED Final   Cryptosporidium NOT DETECTED NOT DETECTED Final   Cyclospora cayetanensis NOT DETECTED NOT DETECTED  Final   Entamoeba histolytica NOT DETECTED NOT DETECTED Final   Giardia lamblia NOT DETECTED NOT DETECTED Final   Adenovirus F40/41 NOT DETECTED NOT DETECTED Final   Astrovirus NOT DETECTED NOT DETECTED Final   Norovirus GI/GII NOT DETECTED NOT DETECTED Final   Rotavirus A NOT DETECTED NOT DETECTED Final   Sapovirus (I, II, IV, and V) NOT DETECTED NOT DETECTED Final    Comment: Performed at [facility], [address], [place], [place] [address]  C Difficile Quick Screen w PCR reflex     Status: None   Collection Time: 10/31/21  8:24 AM   Specimen: STOOL  Result Value Ref Range Status   C Diff antigen NEGATIVE NEGATIVE Final   C Diff toxin NEGATIVE NEGATIVE Final   C Diff interpretation No C. difficile detected.  Final    Comment: Performed at [facility], [address], [place], [place] [address]    Radiology Studies: No results found.  Scheduled Meds:  budesonide  0.25 mg Nebulization BID   cholestyramine  4 g Oral Daily   diphenoxylate-atropine  2 tablet  Oral Q6H   feeding supplement (K[employer] STANDARD 1.4)  325 mL Oral Daily   heparin  5,000 Units Subcutaneous Q8H   lactose free nutrition  237 mL Oral TID WC   metoprolol tartrate  25 mg Oral BID   multivitamin with minerals  1 tablet Oral Daily   pantoprazole  40 mg Oral Daily   Continuous Infusions:   LOS: 9 days   Raiford Noble, DO [facility] Available via Epic secure chat 7am-7pm After these hours, please refer to coverage provider listed on [email or web address] 11/08/2021, 4:27 PM

## 2021-11-09 DIAGNOSIS — I10 Essential (primary) hypertension: Secondary | ICD-10-CM | POA: Diagnosis not present

## 2021-11-09 DIAGNOSIS — R197 Diarrhea, unspecified: Secondary | ICD-10-CM | POA: Diagnosis not present

## 2021-11-09 DIAGNOSIS — E876 Hypokalemia: Secondary | ICD-10-CM | POA: Diagnosis not present

## 2021-11-09 LAB — CBC WITH DIFFERENTIAL/PLATELET
Abs Immature Granulocytes: 0.5 10*3/uL — ABNORMAL HIGH (ref 0.00–0.07)
Band Neutrophils: 57 %
Basophils Absolute: 0 10*3/uL (ref 0.0–0.1)
Basophils Relative: 0 %
Eosinophils Absolute: 0.1 10*3/uL (ref 0.0–0.5)
Eosinophils Relative: 1 %
HCT: 33.2 % — ABNORMAL LOW (ref 36.0–46.0)
Hemoglobin: 11.5 g/dL — ABNORMAL LOW (ref 12.0–15.0)
Lymphocytes Relative: 3 %
Lymphs Abs: 0.2 10*3/uL — ABNORMAL LOW (ref 0.7–4.0)
MCH: 31.9 pg (ref 26.0–34.0)
MCHC: 34.6 g/dL (ref 30.0–36.0)
MCV: 92 fL (ref 80.0–100.0)
Metamyelocytes Relative: 5 %
Monocytes Absolute: 0.5 10*3/uL (ref 0.1–1.0)
Monocytes Relative: 7 %
Myelocytes: 2 %
Neutro Abs: 6.4 10*3/uL (ref 1.7–7.7)
Neutrophils Relative %: 25 %
Platelets: 222 10*3/uL (ref 150–400)
RBC: 3.61 MIL/uL — ABNORMAL LOW (ref 3.87–5.11)
RDW: 22.9 % — ABNORMAL HIGH (ref 11.5–15.5)
WBC: 7.8 10*3/uL (ref 4.0–10.5)
nRBC: 0.4 % — ABNORMAL HIGH (ref 0.0–0.2)

## 2021-11-09 LAB — COMPREHENSIVE METABOLIC PANEL
ALT: 15 U/L (ref 0–44)
AST: 17 U/L (ref 15–41)
Albumin: 1.7 g/dL — ABNORMAL LOW (ref 3.5–5.0)
Alkaline Phosphatase: 58 U/L (ref 38–126)
Anion gap: 8 (ref 5–15)
BUN: 22 mg/dL (ref 8–23)
CO2: 25 mmol/L (ref 22–32)
Calcium: 7.1 mg/dL — ABNORMAL LOW (ref 8.9–10.3)
Chloride: 100 mmol/L (ref 98–111)
Creatinine, Ser: 0.74 mg/dL (ref 0.44–1.00)
GFR, Estimated: >60 mL/min (ref >60)
Glucose, Bld: 96 mg/dL (ref 70–99)
Potassium: 3.6 mmol/L (ref 3.5–5.1)
Sodium: 133 mmol/L — ABNORMAL LOW (ref 135–145)
Total Bilirubin: 1.3 mg/dL — ABNORMAL HIGH (ref 0.3–1.2)
Total Protein: 3.7 g/dL — ABNORMAL LOW (ref 6.5–8.1)

## 2021-11-09 LAB — MAGNESIUM: Magnesium: 2 mg/dL (ref 1.7–2.4)

## 2021-11-09 LAB — PHOSPHORUS: Phosphorus: 3.2 mg/dL (ref 2.5–4.6)

## 2021-11-09 MED ORDER — SODIUM CHLORIDE 0.9 % IV SOLN: INTRAVENOUS | Status: AC

## 2021-11-09 MED ORDER — POTASSIUM CHLORIDE CRYS ER 20 MEQ PO TBCR
40.0000 meq | EXTENDED_RELEASE_TABLET | Freq: Two times a day (BID) | ORAL | Status: AC
  Administered 2021-11-09 – 2021-11-10 (×2): 40 meq via ORAL
  Filled 2021-11-09 (×2): qty 2

## 2021-11-09 NOTE — Progress Notes (Signed)
PROGRESS NOTE    Regina Knox  RDE:[id number] DOB: Feb 22, 1945 DOA: 10/30/2021 PCP: Chevis Pretty, FNP   Brief Narrative:  Regina Knox is a 76 y.o. Caucasian  female with a history of anemia, asthma, hyperlipidemia, GERD, hypertension, colorectal cancer. Patient presented secondary to intractable diarrhea likely secondary to recent radiation therapy. Diarrhea is slowly improving but still continues to have a significant amount. Getting Supportive Care and Antidiarrheals but she continues appear weak and is remains tachycardic.  Bowel movements are slowly improving but given the persistence have consulted GI for further evaluation recommendations and they agree we will be more aggressive with scheduled Lomotil every 6 hours and recommending avoiding dairy products and continuing low soft diet encouraged considering a trial of cholestyramine if not improved. Continues to be very weak and continues to have a lot of diarrhea and GI has been consulted for assistance and they are recommending scheduled Lomotil as well as Questran now.  Patient remains tachycardic so we will initiate a low-dose beta-blocker and uptitrate.  If she remains tachycardic given that she has a history of tachycardia will need to consult cardiology for further assistance.   Assessment and Plan: Intractable diarrhea -Likely secondary to radiation therapy and resultant radiation enteritis. Afebrile.  -No leukocytosis or leukopenia. No abdominal pain. C. Difficile and GI pathogen panel tests negative.  -Emesis resolved. Diarrhea frequency is improving but still having quite a bit.  -C/w Supportive care -IV fluid hydration has now stopped -Soft diet; low fiber -Lomitil 2 tab po 4times Daily prn Diarrhea -Patient refused CT Scan of the Abdomen and Pelvis w/o Contrast that was ordered by Dr. Lonny Prude -Given continued significant Diarrhea gastroenterology was consulted and they are recommending avoiding dairy products and  agreed to be more aggressive with the diarrhea treatments and has scheduled Lomotil every 6 hours and now has initiated Questran -Diarrhea slowly improving but very persistent and GI recommendign Continuing current plan of care and watching electrolytes and monitoring for the next 24 hour; Will resume IVF with NS at 75 mL/hr x12 hours   Rectal cancer -Patient is on Xeloda and receiving radiation treatment.  -Patient is considering stopping treatments as her quality of life is suffering; she is hoping to speak with medical oncology.  -Her primary oncologist is not available, unfortunately.  -On-call oncology Dr. Grayland Ormond contacted for consult. Patient will follow-up with oncology as an outpatient to discuss further treatment options/recommendations.   PseudoHypocalcemia -Falsely low, secondary to hypoalbuminemia.  -Corrected Ca2+ is 8.9 with her calcium being 7.1 and her albumin 1.7   Increased nutrient needs -No malnutrition noted by dietitian. Nutrition management complicated by cancer diagnosis and treatments with resultant adverse effects. Nutrition Status: Nutrition Problem: Increased nutrient needs Etiology: acute illness, cancer and cancer related treatments, chronic illness Signs/Symptoms: estimated needs Interventions: MVI, Carn[employer] Instant Breakfast   Hypokalemia -Likely secondary to diarrheal illness. Supplementation given. -Potassium is now 3.6 and will replete with po KCL 40 mEQ BID x2 -Mag Level is now 1.9 -Continue potassium supplementation as needed -Patient refusing Telemetry Monitoring  -Repeat CMP in the AM    Primary Hypertension -Patient is on olmesartan-hydrochlorothiazide as an outpatient which were held on admission secondary to soft blood pressure and hypovolemia.  -Blood pressure is currently normotensive and was 110/79.   Peripheral Edema -In setting of hypoalbuminemia and IV fluids. Oral intake improved. -Discontinued IV fluids yesterday but resume this  AM given Hyponatremia and continued Diarrhea -Lasix 20 mg IV x1 a few days ago and may  need to repeat  -C/w TED hose and Elevation of Extremities   Normocytic Anemia -Patient's Hgb/Hct went from 10.8/32.2 -> 10.6/31.2 -> 12.4/36.8 -> 11.4/33.3 -> 11.8/34.5 -> 11.5/33.2 -Checked Anemia Panel showed an iron level of 32, UIBC 138, TIBC 170, saturation of 90%, ferritin level 59, folate 19.3, vitamin B12 1282 -Continue to Monitor for S/Sx of Bleeding; No overt bleeding noted -Repeat CBC in the AM    Hypophosphatemia -Patient's phosphorus level is now 3.2 -Continue monitor and trend and repeat Phos level in a.m.   Hyponatremia -Mild and sodium went from 135 -> 133 -> 132 -> 131 -> 133 -Replete as above -Continue to monitor and trend and repeat CMP in a.m.   Hyperbilirubinemia -Mild and trended up as T. bili went from 0.9 -> 1.0 -> 1.5 and likely reactive and improved to 1.0 x2 but is now elevated at 1.3 -Continue to monitor and trend and repeat CMP in the a.m.   Sinus Tachycardia, improving -Patient's heart rate was elevated and remains persistently elevated -Likely physiologic in the setting of above and in the setting of her diarrhea but she does have a history of tachycardia -Continue with IV fluid hydration bolus of 500 mL -Patient is refusing telemetry -C/w metoprolol 25 mg p.o. twice daily and if necessary can uptitrate and  may need to call cardiology for further evaluation    DVT prophylaxis: Place TED hose Start: 11/06/21 1034 heparin injection 5,000 Units Start: 10/30/21 2215 SCDs Start: 10/30/21 2209    Code Status: Full Code Family Communication: No family currently at bedside  Disposition Plan:  Level of care: Telemetry Status is: Inpatient Remains inpatient appropriate because: Continues to be extremely fatigued and continues to have significant diarrhea.  Needs further clinical improvement and clearance by GI prior to safe discharge disposition   Consultants:   Gastroenterology Medical oncology  Procedures:  As delineated as above  Antimicrobials:  Anti-infectives (From admission, onward)    None       Subjective: Seen and examined at bedside and heart rate is doing little bit better but she is still very fatigued and had a "spell" of diarrhea last night.  Still very fatigued appearing and states that she is is about the same.  Continuing Questran and Lomotil scheduled as well as loperamide.  No other concerns or complaints at this time.  Objective: Vitals:   11/08/21 2301 11/09/21 0524 11/09/21 0642 11/09/21 1426  BP: 102/68 (!) 117/58  116/61  Pulse:  100  100  Resp:  18  18  Temp:  97.6 F (36.4 C)  98.6 F (37 C)  TempSrc:  Oral  Oral  SpO2:  97%  97%  Weight:   60 kg   Height:        Intake/Output Summary (Last 24 hours) at 11/09/2021 1510 Last data filed at 11/09/2021 1300 Gross per 24 hour  Intake 720 ml  Output --  Net 720 ml   Filed Weights   10/30/21 1147 11/08/21 0718 11/09/21 0642  Weight: 54.4 kg 61 kg 60 kg   Examination: Physical Exam:  Constitutional: Chronically ill-appearing elderly Caucasian female who appears extremely fatigued still resting in the bed Respiratory: Diminished to auscultation bilaterally, no wheezing, rales, rhonchi or crackles. Normal respiratory effort and patient is not tachypenic. No accessory muscle use.  Unlabored breathing Cardiovascular: RRR, no murmurs / rubs / gallops. S1 and S2 auscultated.  Has 1+ lower extremity pitting edema Abdomen: Soft, mildly-tender, nondistended. No masses palpated. No appreciable hepatosplenomegaly. Bowel  sounds positive x4 and slightly hyperactive.  GU: Deferred. Musculoskeletal: No clubbing / cyanosis of digits/nails. No joint deformity upper and lower extremities  Skin: No rashes, lesions, ulcers on limited skin evaluation. No induration; Warm and dry.  Neurologic: CN 2-12 grossly intact with no focal deficits. Romberg sign and cerebellar reflexes  not assessed.  Psychiatric: Normal judgment and insight. Alert and oriented x 3. Normal mood and appropriate affect.   Data Reviewed: I have personally reviewed following labs and imaging studies  CBC: Recent Labs  Lab 11/05/21 0414 11/06/21 0621 11/07/21 0256 11/08/21 0534 11/09/21 0638  WBC 5.3 6.3 6.9 6.8 7.8  NEUTROABS  --  5.4 5.1 6.4 6.4  HGB 10.6* 12.4 11.4* 11.8* 11.5*  HCT 31.2* 36.8 33.3* 34.5* 33.2*  MCV 92.6 93.4 92.8 93.2 92.0  PLT 245 280 255 246 161   Basic Metabolic Panel: Recent Labs  Lab 11/05/21 0414 11/05/21 0847 11/06/21 0621 11/07/21 0256 11/08/21 0534 11/09/21 0638  NA 134*  --  133* 132* 131* 133*  K 2.6*  --  4.8 4.2 4.3 3.6  CL 99  --  103 101 100 100  CO2 27  --  '22 23 22 25  '$ GLUCOSE 104*  --  119* 97 99 96  BUN 16  --  '18 19 20 22  '$ CREATININE 0.60  --  0.68 0.69 0.74 0.74  CALCIUM 7.0*  --  7.5* 7.3* 7.3* 7.1*  MG  --  1.7 1.9 1.9 1.9 2.0  PHOS  --  2.6 2.3* 2.9 2.8 3.2   GFR: Estimated Creatinine Clearance: 47.9 mL/min (by C-G formula based on SCr of 0.74 mg/dL). Liver Function Tests: Recent Labs  Lab 11/05/21 0847 11/06/21 0621 11/07/21 0256 11/08/21 0534 11/09/21 0638  AST '21 30 19 18 17  '$ ALT '15 19 17 16 15  '$ ALKPHOS 43 52 54 57 58  BILITOT 1.0 1.5* 1.0 1.0 1.3*  PROT 3.5* 4.2* 3.8* 3.8* 3.7*  ALBUMIN 1.7* 2.0* 1.8* 1.8* 1.7*   No results for input(s): "LIPASE", "AMYLASE" in the last 168 hours. No results for input(s): "AMMONIA" in the last 168 hours. Coagulation Profile: No results for input(s): "INR", "PROTIME" in the last 168 hours. Cardiac Enzymes: No results for input(s): "CKTOTAL", "CKMB", "CKMBINDEX", "TROPONINI" in the last 168 hours. BNP (last 3 results) No results for input(s): "PROBNP" in the last 8760 hours. HbA1C: No results for input(s): "HGBA1C" in the last 72 hours. CBG: No results for input(s): "GLUCAP" in the last 168 hours. Lipid Profile: No results for input(s): "CHOL", "HDL", "LDLCALC", "TRIG",  "CHOLHDL", "LDLDIRECT" in the last 72 hours. Thyroid Function Tests: No results for input(s): "TSH", "T4TOTAL", "FREET4", "T3FREE", "THYROIDAB" in the last 72 hours. Anemia Panel: No results for input(s): "VITAMINB12", "FOLATE", "FERRITIN", "TIBC", "IRON", "RETICCTPCT" in the last 72 hours. Sepsis Labs: No results for input(s): "PROCALCITON", "LATICACIDVEN" in the last 168 hours.  Recent Results (from the past 240 hour(s))  Gastrointestinal Panel by PCR , Stool     Status: None   Collection Time: 10/31/21  8:23 AM   Specimen: STOOL  Result Value Ref Range Status   Campylobacter species NOT DETECTED NOT DETECTED Final   Plesimonas shigelloides NOT DETECTED NOT DETECTED Final   Salmonella species NOT DETECTED NOT DETECTED Final   Yersinia enterocolitica NOT DETECTED NOT DETECTED Final   Vibrio species NOT DETECTED NOT DETECTED Final   Vibrio cholerae NOT DETECTED NOT DETECTED Final   Enteroaggregative E coli (EAEC) NOT DETECTED NOT DETECTED Final  Enteropathogenic E coli (EPEC) NOT DETECTED NOT DETECTED Final   Enterotoxigenic E coli (ETEC) NOT DETECTED NOT DETECTED Final   Shiga like toxin producing E coli (STEC) NOT DETECTED NOT DETECTED Final   Shigella/Enteroinvasive E coli (EIEC) NOT DETECTED NOT DETECTED Final   Cryptosporidium NOT DETECTED NOT DETECTED Final   Cyclospora cayetanensis NOT DETECTED NOT DETECTED Final   Entamoeba histolytica NOT DETECTED NOT DETECTED Final   Giardia lamblia NOT DETECTED NOT DETECTED Final   Adenovirus F40/41 NOT DETECTED NOT DETECTED Final   Astrovirus NOT DETECTED NOT DETECTED Final   Norovirus GI/GII NOT DETECTED NOT DETECTED Final   Rotavirus A NOT DETECTED NOT DETECTED Final   Sapovirus (I, II, IV, and V) NOT DETECTED NOT DETECTED Final    Comment: Performed at [facility], [address] [place], [place] [address]  C Difficile Quick Screen w PCR reflex     Status: None   Collection Time: 10/31/21  8:24 AM   Specimen: STOOL   Result Value Ref Range Status   C Diff antigen NEGATIVE NEGATIVE Final   C Diff toxin NEGATIVE NEGATIVE Final   C Diff interpretation No C. difficile detected.  Final    Comment: Performed at [facility], [address], [place], [place] [address]    Radiology Studies: No results found.  Scheduled Meds:  budesonide  0.25 mg Nebulization BID   cholestyramine  4 g Oral Daily   diphenoxylate-atropine  2 tablet Oral Q6H   feeding supplement (KATE FARMS STANDARD 1.4)  325 mL Oral Daily   heparin  5,000 Units Subcutaneous Q8H   lactose free nutrition  237 mL Oral TID WC   metoprolol tartrate  25 mg Oral BID   multivitamin with minerals  1 tablet Oral Daily   pantoprazole  40 mg Oral Daily   Continuous Infusions:   LOS: 10 days   Raiford Noble, DO [facility] Available via Epic secure chat 7am-7pm After these hours, please refer to coverage provider listed on [email or web address] 11/09/2021, 3:10 PM

## 2021-11-10 ENCOUNTER — Inpatient Hospital Stay (HOSPITAL_COMMUNITY): Payer: Medicare HMO

## 2021-11-10 DIAGNOSIS — R197 Diarrhea, unspecified: Secondary | ICD-10-CM | POA: Diagnosis not present

## 2021-11-10 DIAGNOSIS — I10 Essential (primary) hypertension: Secondary | ICD-10-CM | POA: Diagnosis not present

## 2021-11-10 DIAGNOSIS — E876 Hypokalemia: Secondary | ICD-10-CM | POA: Diagnosis not present

## 2021-11-10 LAB — COMPREHENSIVE METABOLIC PANEL
ALT: 16 U/L (ref 0–44)
AST: 19 U/L (ref 15–41)
Albumin: 1.7 g/dL — ABNORMAL LOW (ref 3.5–5.0)
Alkaline Phosphatase: 66 U/L (ref 38–126)
Anion gap: 10 (ref 5–15)
BUN: 23 mg/dL (ref 8–23)
CO2: 23 mmol/L (ref 22–32)
Calcium: 7.1 mg/dL — ABNORMAL LOW (ref 8.9–10.3)
Chloride: 101 mmol/L (ref 98–111)
Creatinine, Ser: 0.72 mg/dL (ref 0.44–1.00)
GFR, Estimated: >60 mL/min (ref >60)
Glucose, Bld: 95 mg/dL (ref 70–99)
Potassium: 4.1 mmol/L (ref 3.5–5.1)
Sodium: 134 mmol/L — ABNORMAL LOW (ref 135–145)
Total Bilirubin: 1.2 mg/dL (ref 0.3–1.2)
Total Protein: 3.7 g/dL — ABNORMAL LOW (ref 6.5–8.1)

## 2021-11-10 LAB — CBC WITH DIFFERENTIAL/PLATELET
Abs Immature Granulocytes: 0.4 10*3/uL — ABNORMAL HIGH (ref 0.00–0.07)
Band Neutrophils: 35 %
Basophils Absolute: 0 10*3/uL (ref 0.0–0.1)
Basophils Relative: 0 %
Eosinophils Absolute: 0 10*3/uL (ref 0.0–0.5)
Eosinophils Relative: 0 %
HCT: 35.1 % — ABNORMAL LOW (ref 36.0–46.0)
Hemoglobin: 11.8 g/dL — ABNORMAL LOW (ref 12.0–15.0)
Lymphocytes Relative: 2 %
Lymphs Abs: 0.3 10*3/uL — ABNORMAL LOW (ref 0.7–4.0)
MCH: 31.4 pg (ref 26.0–34.0)
MCHC: 33.6 g/dL (ref 30.0–36.0)
MCV: 93.4 fL (ref 80.0–100.0)
Metamyelocytes Relative: 3 %
Monocytes Absolute: 0 10*3/uL — ABNORMAL LOW (ref 0.1–1.0)
Monocytes Relative: 0 %
Neutro Abs: 13.6 10*3/uL — ABNORMAL HIGH (ref 1.7–7.7)
Neutrophils Relative %: 60 %
Platelets: 239 10*3/uL (ref 150–400)
RBC: 3.76 MIL/uL — ABNORMAL LOW (ref 3.87–5.11)
RDW: 23.6 % — ABNORMAL HIGH (ref 11.5–15.5)
WBC Morphology: INCREASED
WBC: 14.3 10*3/uL — ABNORMAL HIGH (ref 4.0–10.5)
nRBC: 0.3 % — ABNORMAL HIGH (ref 0.0–0.2)

## 2021-11-10 LAB — URINALYSIS, ROUTINE W REFLEX MICROSCOPIC
Bilirubin Urine: NEGATIVE
Glucose, UA: NEGATIVE mg/dL
Ketones, ur: 20 mg/dL — AB
Nitrite: NEGATIVE
Protein, ur: 30 mg/dL — AB
Specific Gravity, Urine: 1.027 (ref 1.005–1.030)
pH: 5 (ref 5.0–8.0)

## 2021-11-10 LAB — PHOSPHORUS: Phosphorus: 2.8 mg/dL (ref 2.5–4.6)

## 2021-11-10 LAB — MAGNESIUM: Magnesium: 2.1 mg/dL (ref 1.7–2.4)

## 2021-11-10 MED ORDER — ALPRAZOLAM 0.25 MG PO TABS
0.2500 mg | ORAL_TABLET | Freq: Two times a day (BID) | ORAL | Status: DC | PRN
  Administered 2021-11-10 – 2021-11-17 (×4): 0.25 mg via ORAL
  Filled 2021-11-10 (×4): qty 1

## 2021-11-10 MED ORDER — SODIUM CHLORIDE 0.9 % IV SOLN: INTRAVENOUS | Status: DC

## 2021-11-10 MED ORDER — IOHEXOL 300 MG/ML  SOLN
100.0000 mL | Freq: Once | INTRAMUSCULAR | Status: AC | PRN
  Administered 2021-11-10: 80 mL via INTRAVENOUS

## 2021-11-10 MED ORDER — SODIUM CHLORIDE 0.9 % IV BOLUS
500.0000 mL | Freq: Once | INTRAVENOUS | Status: AC
  Administered 2021-11-10: 500 mL via INTRAVENOUS

## 2021-11-10 NOTE — Progress Notes (Cosign Needed Addendum)
Subjective: Diarrhea may be a little better, but weakness is horrible. States she can hardly get out of bed. Tends to have about 2 large watery BMs in 24 hours, otherwise, about 6 small BMs in 24 hours. Stool documentation no accurate.  No brbpr or melena. Slight lower abdominal cramping prior to a larger BM that improves thereafter. No nausea or vomiting.   Not eating much because as soon as she eats, she has to have a BM.   Dairy: Drinking some milk.    This Wednesday will be 2 weeks since having chemo/XRT treatment. Was going Monday through Friday.   White count is up to 14.3 today from 7.8 yesterday. Denies fever, chills, cough, cold or flu like symptoms, or urinary symptoms.   Objective: Vital signs in last 24 hours: Temp:  [97.8 F (36.6 C)-98.6 F (37 C)] 97.8 F (36.6 C) (09/25 0516) Pulse Rate:  [84-100] 97 (09/25 0516) Resp:  [18] 18 (09/25 0516) BP: (86-116)/(53-62) 90/55 (09/25 0516) SpO2:  [96 %-98 %] 96 % (09/25 0516) Weight:  [60.7 kg] 60.7 kg (09/25 0516) Last BM Date : 11/09/21 General:   Alert and oriented, pleasant Head:  Normocephalic and atraumatic. Eyes:  No icterus, sclera clear. Conjuctiva pink.  Mouth:  Without lesions, mucosa pink and moist.  Neck:  Supple, without thyromegaly or masses.  Heart:  S1, S2 present, no murmurs noted.  Lungs: Clear to auscultation bilaterally, without wheezing, rales, or rhonchi.  Abdomen:  Bowel sounds present, soft, non-tender, non-distended. No HSM or hernias noted. No rebound or guarding. No masses appreciated  Msk:  Symmetrical without gross deformities. Normal posture. Pulses:  Normal pulses noted. Extremities:  Without clubbing or edema. Neurologic:  Alert and  oriented x4;  grossly normal neurologically. Skin:  Warm and dry, intact without significant lesions.  Cervical Nodes:  No significant cervical adenopathy. Psych:  Alert and cooperative. Normal mood and affect.  Intake/Output from previous day: 09/24  0701 - 09/25 0700 In: 720 [P.O.:720] Out: -  Intake/Output this shift: No intake/output data recorded.  Lab Results: Recent Labs    11/08/21 0534 11/09/21 0638 11/10/21 0603  WBC 6.8 7.8 14.3*  HGB 11.8* 11.5* 11.8*  HCT 34.5* 33.2* 35.1*  PLT 246 222 239   BMET Recent Labs    11/08/21 0534 11/09/21 0638 11/10/21 0603  NA 131* 133* 134*  K 4.3 3.6 4.1  CL 100 100 101  CO2 '22 25 23  '$ GLUCOSE 99 96 95  BUN '20 22 23  '$ CREATININE 0.74 0.74 0.72  CALCIUM 7.3* 7.1* 7.1*   LFT Recent Labs    11/08/21 0534 11/09/21 0638 11/10/21 0603  PROT 3.8* 3.7* 3.7*  ALBUMIN 1.8* 1.7* 1.7*  AST '18 17 19  '$ ALT '16 15 16  '$ ALKPHOS 57 58 66  BILITOT 1.0 1.3* 1.2     Assessment: 76 year old lady with a history of C diff in January 2023, persistent mild diarrhea thereafter and found to have 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  GI consulted for intractable diarrhea.   Acute on chronic diarrhea:  At baseline having some diarrhea in the setting of near circumferential nonobstructing tumor in the rectum but per patient was manageable.  Diarrhea worsened approximately 2 weeks into chemoradiation treatments becoming intractable the last week of treatment. Per patient, last treatment was 9/13. Since admission, stool studies including GIP and C. difficile negative.  She has been on Lomotil 2 tablets QID and Questran 4g daily without any significant improvement in diarrhea. Reports having about 2 large BMs and 6 smaller BMs per day. She has mild lower abdominal pain prior to a large BM, but otherwise, no abdominal pain, nausea, vomiting, brbpr, or melena. Continues to be significantly weak and is not taking in any significant amount of food as she reports every time she eats, she has to have a BM.   Notably, her white count has increased today to 14.3  from 7.8. BP has been soft this morning.  She remains afebrile and denies cold or flu like symptoms, cough, urinary symptoms. CXR today shows dilated loops of small bowel in upper left abdomen. Also with right pleural effusion with adjacent right basilar opacities, could be atelectasis or infection which could explain increasing white count. Blood cultures and UA also pending.   It has been suspected that her diarrhea is secondary to chemo/XRT, possible proctocolitis related to chemo/XRT. With appearance of dilated small bowel loops on CXR, will get abdominal x-ray for better visualization. May need to consider CT pending findings. We may need to consider flex sig for further evaluation of on going diarrhea and/or repeat stool studies. Will see what imaging shows first.   Plan: DG abdomen 2 views.  Continue scheduled Lomotil Continue Questran 4g daily.  Further recommendations pending DG abdomen. Could consider increasing Questran. May need flex sig for further evaluation of persistent diarrhea pending radiographic findings today.    LOS: 11 days    11/10/2021, 9:35 AM   Aliene Altes, [facility] Gastroenterology    Addendum:  DG abdomen with abnormal dilation of small bowel loops suggesting partial small bowel obstruction. Clinically, she is not obstructed, but planning on CT with IV contrast only as patient is refusing oral contrast due to inability to drink large volumes at one time. Again denying any nausea, vomiting, or any significant abdominal pain.  Aliene Altes, PA-C [facility] Gastroenterology

## 2021-11-10 NOTE — Progress Notes (Signed)
PROGRESS NOTE    [name]  LGX:[id number] DOB: 09-22-1945 DOA: 10/30/2021 PCP: Chevis Pretty, FNP   Brief Narrative:  Regina Knox is a 76 y.o. Caucasian  female with a history of anemia, asthma, hyperlipidemia, GERD, hypertension, colorectal cancer. Patient presented secondary to intractable diarrhea likely secondary to recent radiation therapy. Diarrhea is slowly improving but still continues to have a significant amount. Getting Supportive Care and Antidiarrheals but she continues appear weak and is remains tachycardic.  Bowel movements are slowly improving but given the persistence have consulted GI for further evaluation recommendations and they agree we will be more aggressive with scheduled Lomotil every 6 hours and recommending avoiding dairy products and continuing low soft diet encouraged considering a trial of cholestyramine if not improved. Continues to be very weak and continues to have a lot of diarrhea and GI has been consulted for assistance and they are recommending scheduled Lomotil as well as Questran now.  Patient remains tachycardic so we will initiate a low-dose beta-blocker and uptitrate.  If she remains tachycardic given that she has a history of tachycardia will need to consult cardiology for further assistance.   Patient was not appearing as well and appeared more fatigued and her WBC trended up and doubled from yesterday.  We will panculture obtain blood cultures x2, urinalysis as well as a chest x-ray and obtain a CT scan of the abdomen pelvis given that she had an abnormal KUB.   Assessment and Plan: Intractable diarrhea -Likely secondary to radiation therapy and resultant radiation enteritis. Afebrile.  -No leukocytosis or leukopenia. No abdominal pain. C. Difficile and GI pathogen panel tests negative.  -Emesis resolved. Diarrhea frequency is improving but still having quite a bit.  -C/w Supportive care -IV fluid hydration has now stopped -Soft diet;  low fiber -Lomitil 2 tab po 4times Daily prn Diarrhea -Patient refused CT Scan of the Abdomen and Pelvis w/o Contrast that was ordered by Dr. Lonny Prude -Given continued significant Diarrhea gastroenterology was consulted and they are recommending avoiding dairy products and agreed to be more aggressive with the diarrhea treatments and has scheduled Lomotil every 6 hours and now has initiated Questran -She appeared alert today and WBC trended along what direction and trended up to 14.3; GI was consulted and recommended KUB and has done and showed "There is abnormal dilation of small-bowel loops suggesting possible partial small bowel obstruction. If clinically warranted, follow-up CT may be considered. Possible small right renal stone." -We will obtain blood cultures x2, chest x-ray, urinalysis and CT scan of the abdomen pelvis given that she appeared worsening -CT scan done and showed "There is circumferential wall thickening and mild inflammation of the rectosigmoid colon. Air-fluid levels are seen throughout the remaining colon. Multiple small bowel loops in the pelvis and the terminal ileum demonstrate circumferential wall thickening with associated mesenteric edema and mild surrounding inflammation compatible with enteritis. There is upstream dilatation of small bowel with air-fluid levels measuring up to 3.2 cm. There is a large hiatal hernia. The stomach is nondilated. No evidence for pneumatosis, portal venous gas, or free air. Appendix is not seen" -CT scan also was read as "There is wall thickening and inflammation of ileal loops and terminal ileum compatible with nonspecific enteritis. There is resulted upstream small bowel obstruction. Rectosigmoid colon wall thickening compatible with proctocolitis. Perirectal lymphadenopathy has mildly decreased.  New small volume ascites and anasarca. New small right pleural effusion." -Diarrhea slowly improving but very persistent and GI recommendign  Continuing current plan  of care and watching electrolytes and monitoring for the next 24 hour; resume esume IVF with NS at 75 MLS per hour and give her a bolus but will stop given her anasarca on the CT scan of the abdomen pelvis -Appreciate further GI evaluation and assistance   Rectal cancer -Patient is on Xeloda and receiving radiation treatment.  -Patient is considering stopping treatments as her quality of life is suffering; she is hoping to speak with medical oncology.  -Her primary oncologist is not available, unfortunately.  -On-call oncology Dr. Grayland Ormond contacted for consult. Patient will follow-up with oncology as an outpatient to discuss further treatment options/recommendations.   PseudoHypocalcemia -Falsely low, secondary to hypoalbuminemia.  -Corrected Ca2+ is 8.9 with her calcium being 7.1 and her albumin 1.7   Increased nutrient needs -No malnutrition noted by dietitian. Nutrition management complicated by cancer diagnosis and treatments with resultant adverse effects. -Nutrition Status: Nutrition Problem: Increased nutrient needs Etiology: acute illness, cancer and cancer related treatments, chronic illness Signs/Symptoms: estimated needs Interventions: MVI, Carn[employer] Instant Breakfast   Hypokalemia -Likely secondary to diarrheal illness. Supplementation given. -Potassium is now 4.1 and magnesium was 2.1 -Continue potassium supplementation as needed -Patient refusing Telemetry Monitoring  -Repeat CMP in the AM    Primary Hypertension -Patient is on olmesartan-hydrochlorothiazide as an outpatient which were held on admission secondary to soft blood pressure and hypovolemia.  -Blood pressure was slightly hypotensive this morning so give her a bolus and her blood pressure is 112/63   Peripheral Edema -In setting of hypoalbuminemia and IV fluids. Oral intake improved. -Discontinued IV fluids yesterday but resume this AM given Hyponatremia and continued Diarrhea -Lasix  20 mg IV x1 and may need to give again given her anasarca noted and may consider albumin -C/w TED hose and Elevation of Extremities   Normocytic Anemia -Patient's Hgb/Hct went from 10.8/32.2 -> 10.6/31.2 -> 12.4/36.8 -> 11.4/33.3 -> 11.8/34.5 -> 11.5/33.2 and is now 11.8/35.1 -Checked Anemia Panel showed an iron level of 32, UIBC 138, TIBC 170, saturation of 90%, ferritin level 59, folate 19.3, vitamin B12 1282 -Continue to Monitor for S/Sx of Bleeding; No overt bleeding noted -Repeat CBC in the AM    Hypophosphatemia -Patient's phosphorus level is now 2.8 -Continue monitor and trend and repeat Phos level in a.m.   Hyponatremia -Mild and sodium went from 135 -> 133 -> 132 -> 131 -> 133 and is now 134 -Replete as above -Continue to monitor and trend and repeat CMP in a.m.   Hyperbilirubinemia -Mild and trended up as T. bili went from 0.9 -> 1.0 -> 1.5 and likely reactive and improved to 1.0 x2 but is now elevated at 1.3 yesterday and today is 1.2 -Continue to monitor and trend and repeat CMP in the a.m.  Leukocytosis -Unclear etiology -Check blood cultures x2, urinalysis was done and showed a cloudy appearance with amber-colored urine, moderate hemoglobin, 20 ketones, moderate leukocytes, negative nitrites, few bacteria, 21-50 RBCs per high-power field, 11-20 squamous epithelial cells -Chest x-ray done and showed "Small right pleural effusion with adjacent right basilar opacities, could be atelectasis or infection.   Dilated loops of small bowel in the upper left abdomen, correlate with exam and consider abdominal radiographs." -KUB done and showed "There is abnormal dilation of small-bowel loops suggesting possible partial small bowel obstruction. If clinically warranted, follow-up CT may be considered. Possible small right renal stone." -CT Abd/Pelvis done and showed "There is wall thickening and inflammation of ileal loops and terminal ileum compatible with nonspecific enteritis. There  is resulted upstream small bowel obstruction. Rectosigmoid colon wall thickening compatible with proctocolitis. Perirectal lymphadenopathy has mildly decreased. . New small volume ascites and anasarca.. New small right pleural effusion." -Prescient further evaluation by GI and they are considering doing a flex sig    Sinus Tachycardia, improving -Patient's heart rate was elevated and remains persistently elevated -Likely physiologic in the setting of above and in the setting of her diarrhea but she does have a history of tachycardia -Continue with IV fluid hydration bolus of 500 mL -Patient is refusing telemetry -C/w metoprolol 25 mg p.o. twice daily and if necessary can uptitrate and  may need to call cardiology for further evaluation  DVT prophylaxis: Place TED hose Start: 11/06/21 1034 heparin injection 5,000 Units Start: 10/30/21 2215 SCDs Start: 10/30/21 2209    Code Status: Full Code Family Communication: No family currently at bedside  Disposition Plan:  Level of care: Telemetry Status is: Inpatient Remains inpatient appropriate because: Continues to remain ill-appearing and needs further clearance and improvement by gastroenterology and will need to consult her primary oncologist and general surgery   Consultants:  Medical oncology Gastroenterology  Procedures:  As delineated as above  Antimicrobials:  Anti-infectives (From admission, onward)    None       Subjective: Seen and examined at bedside and was not feeling as well today and continues to have "spells" of diarrhea and abdominal discomfort.  States that when she tries to eat it is close to her.  Still has some leg swelling.  Very fatigued appearing.  No other concerns or complaints at this time.  Objective: Vitals:   11/09/21 2101 11/09/21 2152 11/10/21 0516 11/10/21 1403  BP: (!) 86/53 108/62 (!) 90/55 112/63  Pulse: 90 84 97 82  Resp: '18 18 18 17  '$ Temp: 97.8 F (36.6 C)  97.8 F (36.6 C) 98.6 F (37 C)   TempSrc: Oral  Oral   SpO2: 98%  96% 100%  Weight:   60.7 kg   Height:        Intake/Output Summary (Last 24 hours) at 11/10/2021 1838 Last data filed at 11/10/2021 1836 Gross per 24 hour  Intake 578.75 ml  Output --  Net 578.75 ml   Filed Weights   11/08/21 0718 11/09/21 0642 11/10/21 0516  Weight: 61 kg 60 kg 60.7 kg   Examination: Physical Exam:  Constitutional: Chronically ill-appearing Caucasian elderly female who appears very fatigued and uncomfortable Respiratory: Diminished to auscultation bilaterally, no wheezing, rales, rhonchi or crackles. Normal respiratory effort and patient is not tachypenic. No accessory muscle use.  Unlabored breathing Cardiovascular: RRR, no murmurs / rubs / gallops. S1 and S2 auscultated.  Has 1+ lower extremity pitting edema Abdomen: Soft, mild-tender, distended secondary body. Bowel sounds positive.  GU: Deferred. Musculoskeletal: No clubbing / cyanosis of digits/nails. No joint deformity upper and lower extremities Skin: No rashes, lesions, ulcers limited skin evaluation. No induration; Warm and dry.  Neurologic: CN 2-12 grossly intact with no focal deficits. Romberg sign cerebellar reflexes not assessed.  Psychiatric: Normal judgment and insight.  Awake and alert and oriented x3 depressed appearing and has a slightly withdrawn affect.  Data Reviewed: I have personally reviewed following labs and imaging studies  CBC: Recent Labs  Lab 11/06/21 0621 11/07/21 0256 11/08/21 0534 11/09/21 0638 11/10/21 0603  WBC 6.3 6.9 6.8 7.8 14.3*  NEUTROABS 5.4 5.1 6.4 6.4 13.6*  HGB 12.4 11.4* 11.8* 11.5* 11.8*  HCT 36.8 33.3* 34.5* 33.2* 35.1*  MCV 93.4 92.8 93.2 92.0  93.4  PLT 280 255 246 222 854   Basic Metabolic Panel: Recent Labs  Lab 11/06/21 0621 11/07/21 0256 11/08/21 0534 11/09/21 0638 11/10/21 0603  NA 133* 132* 131* 133* 134*  K 4.8 4.2 4.3 3.6 4.1  CL 103 101 100 100 101  CO2 '22 23 22 25 23  '$ GLUCOSE 119* 97 99 96 95  BUN '18  19 20 22 23  '$ CREATININE 0.68 0.69 0.74 0.74 0.72  CALCIUM 7.5* 7.3* 7.3* 7.1* 7.1*  MG 1.9 1.9 1.9 2.0 2.1  PHOS 2.3* 2.9 2.8 3.2 2.8   GFR: Estimated Creatinine Clearance: 47.9 mL/min (by C-G formula based on SCr of 0.72 mg/dL). Liver Function Tests: Recent Labs  Lab 11/06/21 0621 11/07/21 0256 11/08/21 0534 11/09/21 0638 11/10/21 0603  AST '30 19 18 17 19  '$ ALT '19 17 16 15 16  '$ ALKPHOS 52 54 57 58 66  BILITOT 1.5* 1.0 1.0 1.3* 1.2  PROT 4.2* 3.8* 3.8* 3.7* 3.7*  ALBUMIN 2.0* 1.8* 1.8* 1.7* 1.7*   No results for input(s): "LIPASE", "AMYLASE" in the last 168 hours. No results for input(s): "AMMONIA" in the last 168 hours. Coagulation Profile: No results for input(s): "INR", "PROTIME" in the last 168 hours. Cardiac Enzymes: No results for input(s): "CKTOTAL", "CKMB", "CKMBINDEX", "TROPONINI" in the last 168 hours. BNP (last 3 results) No results for input(s): "PROBNP" in the last 8760 hours. HbA1C: No results for input(s): "HGBA1C" in the last 72 hours. CBG: No results for input(s): "GLUCAP" in the last 168 hours. Lipid Profile: No results for input(s): "CHOL", "HDL", "LDLCALC", "TRIG", "CHOLHDL", "LDLDIRECT" in the last 72 hours. Thyroid Function Tests: No results for input(s): "TSH", "T4TOTAL", "FREET4", "T3FREE", "THYROIDAB" in the last 72 hours. Anemia Panel: No results for input(s): "VITAMINB12", "FOLATE", "FERRITIN", "TIBC", "IRON", "RETICCTPCT" in the last 72 hours. Sepsis Labs: No results for input(s): "PROCALCITON", "LATICACIDVEN" in the last 168 hours.  No results found for this or any previous visit (from the past 240 hour(s)).   Radiology Studies: CT ABDOMEN PELVIS W CONTRAST  Result Date: 11/10/2021 CLINICAL DATA:  Abdominal pain. Patient is currently on chemo and radiation for rectal cancer. EXAM: CT ABDOMEN AND PELVIS WITH CONTRAST TECHNIQUE: Multidetector CT imaging of the abdomen and pelvis was performed using the standard protocol following bolus  administration of intravenous contrast. RADIATION DOSE REDUCTION: This exam was performed according to the departmental dose-optimization program which includes automated exposure control, adjustment of the mA and/or kV according to patient size and/or use of iterative reconstruction technique. CONTRAST:  42m OMNIPAQUE IOHEXOL 300 MG/ML  SOLN COMPARISON:  CT chest abdomen and pelvis 08/21/2021. MRI abdomen 09/29/2021. FINDINGS: Lower chest: There is a new small right pleural effusion. Hepatobiliary: Again seen is a calcified granuloma in the liver. No new hepatic lesions are seen. Gallbladder and bile ducts are within normal limits. Pancreas: Unremarkable. No pancreatic ductal dilatation or surrounding inflammatory changes. Spleen: Normal in size without focal abnormality. Adrenals/Urinary Tract: Adrenal glands are unremarkable. Kidneys are normal, without renal calculi, focal lesion, or hydronephrosis. Bladder is unremarkable. Stomach/Bowel: There is circumferential wall thickening and mild inflammation of the rectosigmoid colon. Air-fluid levels are seen throughout the remaining colon. Multiple small bowel loops in the pelvis and the terminal ileum demonstrate circumferential wall thickening with associated mesenteric edema and mild surrounding inflammation compatible with enteritis. There is upstream dilatation of small bowel with air-fluid levels measuring up to 3.2 cm. There is a large hiatal hernia. The stomach is nondilated. No evidence for pneumatosis, portal venous  gas, or free air. Appendix is not seen. Vascular/Lymphatic: Aorta and IVC are normal in size. There are prominent perirectal lymph nodes present. These have decreased in size when compared to the prior study. No new enlarged lymph nodes are seen. Reproductive: Uterus and bilateral adnexa are unremarkable. Other: There is new small amount of free fluid throughout the abdomen and pelvis. There is new body wall edema. No focal abdominal wall  hernia. Musculoskeletal: No acute or significant osseous findings. IMPRESSION: 1. There is wall thickening and inflammation of ileal loops and terminal ileum compatible with nonspecific enteritis. There is resulted upstream small bowel obstruction. 2. Rectosigmoid colon wall thickening compatible with proctocolitis. 3. Perirectal lymphadenopathy has mildly decreased. 4. New small volume ascites and anasarca. 5. New small right pleural effusion. Electronically Signed   By: Ronney Asters M.D.   On: 11/10/2021 18:31   DG Abd 2 Views  Result Date: 11/10/2021 CLINICAL DATA:  Diarrhea, abdominal pain EXAM: ABDOMEN - 2 VIEW COMPARISON:  Chest radiographs done earlier today FINDINGS: There is abnormal dilation of multiple small bowel loops in upper abdomen measuring up to 4.3 cm in diameter. Stomach is not distended. Colon is not distended. There is no pneumoperitoneum. There is 3 mm calcific density overlying the right kidney, possibly renal stone. Right pleural effusion is seen. IMPRESSION: There is abnormal dilation of small-bowel loops suggesting possible partial small bowel obstruction. If clinically warranted, follow-up CT may be considered. Possible small right renal stone. Electronically Signed   By: Elmer Picker M.D.   On: 11/10/2021 13:27   DG CHEST PORT 1 VIEW  Result Date: 11/10/2021 CLINICAL DATA:  Leukocytosis EXAM: PORTABLE CHEST 1 VIEW COMPARISON:  CT 08/21/2021 FINDINGS: The cardiomediastinal silhouette is within normal limits. There is a small right pleural effusion and adjacent right basilar opacities. Skin folds overlie the chest bilaterally. No evidence of pneumothorax. Dilated loops of small bowel in the left upper abdomen. IMPRESSION: Small right pleural effusion with adjacent right basilar opacities, could be atelectasis or infection. Dilated loops of small bowel in the upper left abdomen, correlate with exam and consider abdominal radiographs. Electronically Signed   By: Maurine Simmering  M.D.   On: 11/10/2021 10:06    Scheduled Meds:  budesonide  0.25 mg Nebulization BID   cholestyramine  4 g Oral Daily   diphenoxylate-atropine  2 tablet Oral Q6H   feeding supplement ([employer] STANDARD 1.4)  325 mL Oral Daily   heparin  5,000 Units Subcutaneous Q8H   lactose free nutrition  237 mL Oral TID WC   metoprolol tartrate  25 mg Oral BID   multivitamin with minerals  1 tablet Oral Daily   pantoprazole  40 mg Oral Daily   Continuous Infusions:  sodium chloride 75 mL/hr at 11/10/21 1836    LOS: 11 days   Raiford Noble, DO [facility] Available via Epic secure chat 7am-7pm After these hours, please refer to coverage provider listed on [email or web address] 11/10/2021, 6:38 PM

## 2021-11-11 ENCOUNTER — Encounter (HOSPITAL_COMMUNITY): Payer: Self-pay | Admitting: Family Medicine

## 2021-11-11 DIAGNOSIS — Z515 Encounter for palliative care: Secondary | ICD-10-CM | POA: Diagnosis not present

## 2021-11-11 DIAGNOSIS — C189 Malignant neoplasm of colon, unspecified: Secondary | ICD-10-CM | POA: Diagnosis not present

## 2021-11-11 DIAGNOSIS — I10 Essential (primary) hypertension: Secondary | ICD-10-CM | POA: Diagnosis not present

## 2021-11-11 DIAGNOSIS — K6289 Other specified diseases of anus and rectum: Secondary | ICD-10-CM

## 2021-11-11 DIAGNOSIS — Z7189 Other specified counseling: Secondary | ICD-10-CM | POA: Diagnosis not present

## 2021-11-11 DIAGNOSIS — K529 Noninfective gastroenteritis and colitis, unspecified: Secondary | ICD-10-CM

## 2021-11-11 DIAGNOSIS — R197 Diarrhea, unspecified: Secondary | ICD-10-CM | POA: Diagnosis not present

## 2021-11-11 DIAGNOSIS — C20 Malignant neoplasm of rectum: Secondary | ICD-10-CM

## 2021-11-11 DIAGNOSIS — E876 Hypokalemia: Secondary | ICD-10-CM | POA: Diagnosis not present

## 2021-11-11 DIAGNOSIS — E440 Moderate protein-calorie malnutrition: Secondary | ICD-10-CM | POA: Diagnosis not present

## 2021-11-11 LAB — CBC WITH DIFFERENTIAL/PLATELET
Abs Immature Granulocytes: 0.3 10*3/uL — ABNORMAL HIGH (ref 0.00–0.07)
Band Neutrophils: 41 %
Basophils Absolute: 0 10*3/uL (ref 0.0–0.1)
Basophils Relative: 0 %
Eosinophils Absolute: 0 10*3/uL (ref 0.0–0.5)
Eosinophils Relative: 0 %
HCT: 34.4 % — ABNORMAL LOW (ref 36.0–46.0)
Hemoglobin: 11.6 g/dL — ABNORMAL LOW (ref 12.0–15.0)
Lymphocytes Relative: 4 %
Lymphs Abs: 0.6 10*3/uL — ABNORMAL LOW (ref 0.7–4.0)
MCH: 32.1 pg (ref 26.0–34.0)
MCHC: 33.7 g/dL (ref 30.0–36.0)
MCV: 95.3 fL (ref 80.0–100.0)
Metamyelocytes Relative: 2 %
Monocytes Absolute: 0.1 10*3/uL (ref 0.1–1.0)
Monocytes Relative: 1 %
Neutro Abs: 13.5 10*3/uL — ABNORMAL HIGH (ref 1.7–7.7)
Neutrophils Relative %: 52 %
Platelets: 208 10*3/uL (ref 150–400)
RBC: 3.61 MIL/uL — ABNORMAL LOW (ref 3.87–5.11)
RDW: 24.1 % — ABNORMAL HIGH (ref 11.5–15.5)
WBC Morphology: INCREASED
WBC: 14.5 10*3/uL — ABNORMAL HIGH (ref 4.0–10.5)
nRBC: 0.1 % (ref 0.0–0.2)

## 2021-11-11 LAB — COMPREHENSIVE METABOLIC PANEL
ALT: 18 U/L (ref 0–44)
AST: 18 U/L (ref 15–41)
Albumin: 1.5 g/dL — ABNORMAL LOW (ref 3.5–5.0)
Alkaline Phosphatase: 71 U/L (ref 38–126)
Anion gap: 12 (ref 5–15)
BUN: 24 mg/dL — ABNORMAL HIGH (ref 8–23)
CO2: 19 mmol/L — ABNORMAL LOW (ref 22–32)
Calcium: 6.8 mg/dL — ABNORMAL LOW (ref 8.9–10.3)
Chloride: 103 mmol/L (ref 98–111)
Creatinine, Ser: 0.76 mg/dL (ref 0.44–1.00)
GFR, Estimated: >60 mL/min (ref >60)
Glucose, Bld: 78 mg/dL (ref 70–99)
Potassium: 4.7 mmol/L (ref 3.5–5.1)
Sodium: 134 mmol/L — ABNORMAL LOW (ref 135–145)
Total Bilirubin: 0.7 mg/dL (ref 0.3–1.2)
Total Protein: 3.6 g/dL — ABNORMAL LOW (ref 6.5–8.1)

## 2021-11-11 LAB — MAGNESIUM: Magnesium: 2.2 mg/dL (ref 1.7–2.4)

## 2021-11-11 LAB — PHOSPHORUS: Phosphorus: 2.7 mg/dL (ref 2.5–4.6)

## 2021-11-11 MED ORDER — OCTREOTIDE ACETATE 100 MCG/ML IJ SOLN
150.0000 ug | Freq: Three times a day (TID) | INTRAMUSCULAR | Status: DC
  Administered 2021-11-11 – 2021-11-14 (×8): 150 ug via SUBCUTANEOUS
  Filled 2021-11-11: qty 1.5
  Filled 2021-11-11: qty 3
  Filled 2021-11-11 (×2): qty 1.5
  Filled 2021-11-11: qty 3
  Filled 2021-11-11 (×2): qty 1.5
  Filled 2021-11-11: qty 3
  Filled 2021-11-11 (×10): qty 1.5

## 2021-11-11 MED ORDER — SODIUM CHLORIDE 0.9 % IV BOLUS
500.0000 mL | Freq: Once | INTRAVENOUS | Status: AC
  Administered 2021-11-11: 500 mL via INTRAVENOUS

## 2021-11-11 MED ORDER — FLUTICASONE FUROATE 100 MCG/ACT IN AEPB
1.0000 | INHALATION_SPRAY | Freq: Every day | RESPIRATORY_TRACT | Status: DC
  Administered 2021-11-11 – 2021-11-18 (×6): 1 via RESPIRATORY_TRACT
  Filled 2021-11-11: qty 1

## 2021-11-11 NOTE — Consult Note (Addendum)
Consultation Note Date: 11/11/2021   Patient Name: ILA LANDOWSKI  DOB: [birth year]  MRN: [id number]  Age / Sex: 76 y.o., female  PCP: Chevis Pretty, FNP Referring Physician: Kerney Elbe, DO  Reason for Consultation: Establishing goals of care  HPI/Patient Profile: 76 y.o. female  with past medical history of colorectal cancer with concurrent chemoradiation, last treatment approximately 2 weeks ago, completed 19 sessions, HTN/HLD, GERD, anemia, asthma, admitted on 10/30/2021 with intractable diarrhea likely secondary to radiation therapy and resultant radiation enteritis.   Clinical Assessment and Goals of Care: I have reviewed medical records including EPIC notes, labs and imaging, received report from RN, assessed the patient.  Mrs. Cari Caraway is lying quietly in bed.  She appears acutely/chronically ill and quite frail.  She is alert and oriented x3, able to make her basic needs known.  Her son/healthcare surrogate, Claudette Stapler is present at bedside.  We meet at the bedside to discuss diagnosis prognosis, GOC, EOL wishes, disposition and options.  I introduced Palliative Medicine as specialized medical care for people living with serious illness. It focuses on providing relief from the symptoms and stress of a serious illness. The goal is to improve quality of life for both the patient and the family.  We discussed a brief life review of the patient.  Mrs. Cari Caraway tells me that her husband died about 2 years ago.  She has 1 child, son [name].  [name] is very involved and available to assist Mrs. Schuler with needs.  He has been assisting with coordination of appointments.  We then focused on their current illness.  We talk about her acute diarrhea related to x-ray treatments and the treatment plan.  We talk about what is normal and expected.  We talked about nutrition.  We talked about symptom  management.  We talked about some treatment options including, but not limited to, increasing cholestyramine, the use of sulfasalazine and corticosteroids.  Mrs. Cari Caraway shares that she is agreeable to try these.  We also talk about octreotide subcu.  She is also agreeable to try this.  The natural disease trajectory and expectations at EOL were discussed.  Advanced directives, concepts specific to code status, were considered and discussed.  We talked about the concept of "treat the treatable, but allowing natural passing".  Ms. Cari Caraway states that she would not want long-term life support, no tracheostomy.  Her son is in agreement.  Discussed the importance of continued conversation with family and the medical providers regarding overall plan of care and treatment options, ensuring decisions are within the context of the patient's values and GOCs. Questions and concerns were addressed.  The family was encouraged to call with questions or concerns.  PMT will continue to support holistically.  Conference with attending, oncology, bedside nursing staff, transition of care team related to patient condition, needs, goals of care, disposition.   HCPOA  NEXT OF KIN -Mrs. Shull's husband died approximately 2 years ago.  She has 1 child, son Claudette Stapler who is her healthcare surrogate.  SUMMARY OF RECOMMENDATIONS   At this point continue to treat the treatable Time for outcomes States no further radiation therapy Agreeable to continue with surgery if able   Code Status/Advance Care Planning: Full code - We talked about the concept of "treat the treatable, but allowing natural passing".  Ms. Cari Caraway states that she would not want long-term life support, no tracheostomy.  Her son is in agreement.   Symptom Management:  Per hospitalist/oncology Recommendations for x ray therapy induced diarrhea   Palliative Prophylaxis:  Bowel Regimen and Oral Care  Additional Recommendations (Limitations,  Scope, Preferences): Full Scope Treatment  Psycho-social/Spiritual:  Desire for further Chaplaincy support:no Additional Recommendations: Caregiving  Support/Resources  Prognosis:  Unable to determine, based on outcomes.   Discharge Planning:  To be determined, based on needs.        Primary Diagnoses: Present on Admission:  Intractable diarrhea  Benign essential hypertension   I have reviewed the medical record, interviewed the patient and family, and examined the patient. The following aspects are pertinent.  Past Medical History:  Diagnosis Date   Allergy    Anemia    Asthma    Elevated cholesterol    GERD (gastroesophageal reflux disease)    Hypertension    Social History   Socioeconomic History   Marital status: Widowed    Spouse name: Jori Moll   Number of children: 1   Years of education: 12   Highest education level: 12th grade  Occupational History   Occupation: Retired  Tobacco Use   Smoking status: Never   Smokeless tobacco: Never  Vaping Use   Vaping Use: Never used  Substance and Sexual Activity   Alcohol use: No   Drug use: No   Sexual activity: Not Currently  Other Topics Concern   Not on file  Social History Narrative   Husband passed 2021   Son lives in [place] Determinants of Health   Financial Resource Strain: Low Risk  (10/29/2021)   Overall Financial Resource Strain (CARDIA)    Difficulty of Paying Living Expenses: Not hard at all  Food Insecurity: No Food Insecurity (11/01/2021)   Hunger Vital Sign    Worried About Running Out of Food in the Last Year: Never true    [place] in the Last Year: Never true  Transportation Needs: No Transportation Needs (11/01/2021)   PRAPARE - Hydrologist (Medical): No    Lack of Transportation (Non-Medical): No  Physical Activity: Inactive (10/29/2021)   Exercise Vital Sign    Days of Exercise per Week: 0 days    Minutes of Exercise per Session: 0 min   Stress: Stress Concern Present (10/29/2021)   [place]    Feeling of Stress : To some extent  Social Connections: Moderately Integrated (10/29/2021)   Social Connection and Isolation Panel [NHANES]    Frequency of Communication with Friends and Family: More than three times a week    Frequency of Social Gatherings with Friends and Family: Twice a week    Attends Religious Services: More than 4 times per year    Active Member of [employer] or Organizations: Yes    Attends Archivist Meetings: More than 4 times per year    Marital Status: Widowed   Family History  Problem Relation Age of Onset   Hypertension Mother    Diabetes Mother    Hypertension Father    Heart  disease Father    Cancer Sister        brain, breast   Hypertension Sister    Diabetes Sister    Hypertension Sister    Heart disease Brother        congestive heart failure   Diabetes Maternal Grandmother    Diabetes Paternal Grandmother    Hypertension Son    Diabetes Son    Stomach cancer Neg Hx    Esophageal cancer Neg Hx    Colon cancer Neg Hx    Scheduled Meds:  cholestyramine  4 g Oral Daily   diphenoxylate-atropine  2 tablet Oral Q6H   feeding supplement (KATE FARMS STANDARD 1.4)  325 mL Oral Daily   heparin  5,000 Units Subcutaneous Q8H   lactose free nutrition  237 mL Oral TID WC   metoprolol tartrate  25 mg Oral BID   multivitamin with minerals  1 tablet Oral Daily   pantoprazole  40 mg Oral Daily   Continuous Infusions: PRN Meds:.acetaminophen **OR** acetaminophen, albuterol, ALPRAZolam, calcium carbonate, ondansetron **OR** ondansetron (ZOFRAN) IV, oxyCODONE Medications Prior to Admission:  Prior to Admission medications   Medication Sig Start Date End Date Taking? Authorizing Provider  capecitabine (XELODA) 500 MG tablet Take 3 tablets (1,500 mg total) by mouth 2 (two) times daily after a meal. Take Monday-Friday. Take only  on days of radiation. 09/17/21  Yes Derek Jack, MD  diphenoxylate-atropine (LOMOTIL) 2.5-0.025 MG tablet Take 1 tablet by mouth 4 (four) times daily as needed for diarrhea or loose stools. 10/21/21  Yes Derek Jack, MD  fluticasone (FLONASE) 50 MCG/ACT nasal spray Place 2 sprays into both nostrils daily. Patient taking differently: Place 2 sprays into both nostrils daily as needed for allergies. 02/25/21  Yes Martin, Mary-Margaret, FNP  Fluticasone Furoate (ARNUITY ELLIPTA) 100 MCG/ACT AEPB Inhale 1 Dose into the lungs daily. Take one inhalation orally daily. 08/25/21  Yes Martin, Mary-Margaret, FNP  Iron, Ferrous Sulfate, 325 (65 Fe) MG TABS Take 325 mg by mouth daily. 08/25/21  Yes Martin, Mary-Margaret, FNP  olmesartan-hydrochlorothiazide (BENICAR HCT) 40-25 MG tablet TAKE ONE (1) TABLET EACH DAY Patient taking differently: Take 0.5 tablets by mouth daily. 08/25/21  Yes Hassell Done, Mary-Margaret, FNP  prochlorperazine (COMPAZINE) 10 MG tablet Take 1 tablet (10 mg total) by mouth every 6 (six) hours as needed for nausea or vomiting. 09/18/21  Yes Derek Jack, MD  Vitamin D, Ergocalciferol, (DRISDOL) 1.25 MG (50000 UNIT) CAPS capsule Take 1 capsule (50,000 Units total) by mouth every 7 (seven) days. 08/25/21  Yes Hassell Done, Mary-Margaret, FNP   Allergies  Allergen Reactions   Ibuprofen Rash   Review of Systems  Unable to perform ROS: Acuity of condition    Physical Exam Vitals and nursing note reviewed.  Constitutional:      General: She is not in acute distress.    Appearance: She is ill-appearing.  HENT:     Mouth/Throat:     Mouth: Mucous membranes are moist.  Cardiovascular:     Rate and Rhythm: Normal rate.  Pulmonary:     Effort: Pulmonary effort is normal. No respiratory distress.  Skin:    General: Skin is warm and dry.  Neurological:     Mental Status: She is alert and oriented to person, place, and time.  Psychiatric:        Mood and Affect: Mood normal.         Behavior: Behavior normal.     Vital Signs: BP (!) 89/49   Pulse 86  Temp 98 F (36.7 C)   Resp 14   Ht '5\' 3"'$  (1.6 m)   Wt 60.3 kg Comment: Pt too weak for standing weight  LMP 05/25/1996   SpO2 100%   BMI 23.55 kg/m  Pain Scale: 0-10   Pain Score: 0-No pain   SpO2: SpO2: 100 % O2 Device:SpO2: 100 % O2 Flow Rate: .   IO: Intake/output summary:  Intake/Output Summary (Last 24 hours) at 11/11/2021 1238 Last data filed at 11/11/2021 0000 Gross per 24 hour  Intake 638.75 ml  Output --  Net 638.75 ml    LBM: Last BM Date : 11/10/21 Baseline Weight: Weight: 54.4 kg Most recent weight: Weight: 60.3 kg (Pt too weak for standing weight)     Palliative Assessment/Data:   Flowsheet Rows    Flowsheet Row Most Recent Value  Intake Tab   Referral Department Hospitalist  Unit at Time of Referral Cardiac/Telemetry Unit  Palliative Care Primary Diagnosis Cancer  Date Notified 11/11/21  Palliative Care Type New Palliative care  Reason for referral Clarify Goals of Care  Date of Admission 10/30/21  Date first seen by Palliative Care 11/11/21  # of days Palliative referral response time 0 Day(s)  # of days IP prior to Palliative referral 12  Clinical Assessment   Palliative Performance Scale Score 30%  Pain Max last 24 hours Not able to report  Pain Min Last 24 hours Not able to report  Dyspnea Max Last 24 Hours Not able to report  Dyspnea Min Last 24 hours Not able to report  Psychosocial & Spiritual Assessment   Palliative Care Outcomes        Time In: 1045 Time Out: 1200 Time Total: 75 minutes  Greater than 50%  of this time was spent counseling and coordinating care related to the above assessment and plan.  Signed by: Drue Novel, NP   Please contact Palliative Medicine Team phone at [phone] for questions and concerns.  For individual provider: See Shea Evans

## 2021-11-11 NOTE — Progress Notes (Signed)
PROGRESS NOTE    Regina Knox  TMA:[id number] DOB: March 26, 1945 DOA: 10/30/2021 PCP: Chevis Pretty, FNP   Brief Narrative:  Regina Knox is a 76 y.o. Caucasian  female with a history of anemia, asthma, hyperlipidemia, GERD, hypertension, colorectal cancer. Patient presented secondary to intractable diarrhea likely secondary to recent radiation therapy. Diarrhea is slowly improving but still continues to have a significant amount. Getting Supportive Care and Antidiarrheals but she continues appear weak and is remains tachycardic.  Bowel movements are slowly improving but given the persistence have consulted GI for further evaluation recommendations and they agree we will be more aggressive with scheduled Lomotil every 6 hours and recommending avoiding dairy products and continuing low soft diet encouraged considering a trial of cholestyramine if not improved. Continues to be very weak and continues to have a lot of diarrhea and GI has been consulted for assistance and they are recommending scheduled Lomotil as well as Questran now.  Patient remains tachycardic so we will initiate a low-dose beta-blocker and uptitrate.  If she remains tachycardic given that she has a history of tachycardia will need to consult cardiology for further assistance.    Patient was not appearing as well and appeared more fatigued and her WBC trended up and doubled from yesterday.  We will panculture obtain blood cultures x2, urinalysis as well as a chest x-ray and obtain a CT scan of the abdomen pelvis given that she had an abnormal KUB.  CT scan as below and palliative care has been consulted as well as her medical oncology team.  We will need to discuss the case with general surgery.  Dr. Abbey Chatters to further evaluate and discussed with general surgery about her CT abdomen findings and medical oncology has initiated octreotide for loperamide resistant diarrhea   Assessment and Plan: Intractable diarrhea -Likely  secondary to radiation therapy and resultant radiation enteritis. Afebrile.  -No leukocytosis or leukopenia. No abdominal pain. C. Difficile and GI pathogen panel tests negative.  -Emesis resolved. Diarrhea frequency is improving but still having quite a bit.  -C/w Supportive care -IV fluid hydration has now stopped -Soft diet; low fiber -Lomitil 2 tab po 4times Daily prn Diarrhea -Patient refused CT Scan of the Abdomen and Pelvis w/o Contrast that was ordered by Dr. Lonny Prude -Given continued significant Diarrhea gastroenterology was consulted and they are recommending avoiding dairy products and agreed to be more aggressive with the diarrhea treatments and has scheduled Lomotil every 6 hours and now has initiated Questran -She appeared alert today and WBC trended along what direction and trended up to 14.3; GI was consulted and recommended KUB and has done and showed "There is abnormal dilation of small-bowel loops suggesting possible partial small bowel obstruction. If clinically warranted, follow-up CT may be considered. Possible small right renal stone." -We will obtain blood cultures x2, chest x-ray, urinalysis and CT scan of the abdomen pelvis given that she appeared worsening -CT scan done and showed "There is circumferential wall thickening and mild inflammation of the rectosigmoid colon. Air-fluid levels are seen throughout the remaining colon. Multiple small bowel loops in the pelvis and the terminal ileum demonstrate circumferential wall thickening with associated mesenteric edema and mild surrounding inflammation compatible with enteritis. There is upstream dilatation of small bowel with air-fluid levels measuring up to 3.2 cm. There is a large hiatal hernia. The stomach is nondilated. No evidence for pneumatosis, portal venous gas, or free air. Appendix is not seen" -CT scan also was read as "There is wall thickening  and inflammation of ileal loops and terminal ileum compatible with  nonspecific enteritis. There is resulted upstream small bowel obstruction. Rectosigmoid colon wall thickening compatible with proctocolitis. Perirectal lymphadenopathy has mildly decreased.  New small volume ascites and anasarca. New small right pleural effusion." -Diarrhea slowly improving but very persistent and GI recommendign Continuing current plan of care and watching electrolytes and monitoring for the next 24 hour; resume esume IVF with NS at 75 MLS per hour and give her a bolus but will stop given her anasarca on the CT scan of the abdomen pelvis -Appreciate further GI evaluation and assistance -Medical oncology recommending octreotide on 50 mcg SQ 3 times daily for better control loperamide resistant diarrhea -Palliative care consulted for further goals of care discussion they are recommending continue to treat the treatable and plan for outcomes with the patient stating no further radiation therapy or chemotherapy but she is agreeable to surgery if indicated   Rectal cancer -Patient is on Xeloda and receiving radiation treatment.  -Patient is considering stopping treatments as her quality of life is suffering; she is hoping to speak with medical oncology.  -Her primary oncologist is not available, unfortunately.  -On-call oncology Dr. Grayland Ormond contacted for consult. Patient will follow-up with oncology as an outpatient to discuss further treatment options/recommendations. -Dr. Delton Coombes has been consulted and treatment has been on hold since 10/31/2021 due to admission for intractable diarrhea and she is now indicated to the primary oncologist that she does not want to pursue any further treatment for her rectal cancer   PseudoHypocalcemia -Falsely low, secondary to hypoalbuminemia.  -Continue to monitor and trend and repeat CMP in a.m.   Increased nutrient needs -No malnutrition noted by dietitian. Nutrition management complicated by cancer diagnosis and treatments with resultant  adverse effects. -Nutrition Status: Nutrition Problem: Increased nutrient needs Etiology: acute illness, cancer and cancer related treatments, chronic illness Signs/Symptoms: estimated needs Interventions: MVI, Carn[employer] Instant Breakfast  COPD -Continue home nebs and albuterol 2.5 mg neb every 4 as needed for wheezing or shortness breath.  She does not want the Xopenex and Atrovent given that it did not make her "feel well"   Hypokalemia -Likely secondary to diarrheal illness. Supplementation given. -Potassium is now 4.7 and magnesium was 2.2 -Continue potassium supplementation as needed -Patient refusing Telemetry Monitoring  -Repeat CMP in the AM    Primary Hypertension -Patient is on olmesartan-hydrochlorothiazide as an outpatient which were held on admission secondary to soft blood pressure and hypovolemia.  -Blood pressure was slightly hypotensive this morning so give her a bolus and her blood pressure is 116/82   Peripheral Edema -In setting of hypoalbuminemia and IV fluids. Oral intake improved. -Discontinued IV fluids yesterday but resume this AM given Hyponatremia and continued Diarrhea -Lasix 20 mg IV x1 and may need to give again given her anasarca noted and may consider albumin -C/w TED hose and Elevation of Extremities   Normocytic Anemia -Patient's Hgb/Hct went from 10.8/32.2 -> 10.6/31.2 -> 12.4/36.8 -> 11.4/33.3 -> 11.8/34.5 -> 11.5/33.2  -> 11.8/35.1 -> 11.6/34.4 -Checked Anemia Panel showed an iron level of 32, UIBC 138, TIBC 170, saturation of 90%, ferritin level 59, folate 19.3, vitamin B12 1282 -Continue to Monitor for S/Sx of Bleeding; No overt bleeding noted -Repeat CBC in the AM    Hypophosphatemia -Patient's phosphorus level is now 2.7 -Continue monitor and trend and repeat Phos level in a.m.   Hyponatremia -Mild and sodium went from 135 -> 133 -> 132 -> 131 -> 133 and is now  134 x2 -Replete as above -Continue to monitor and trend and repeat CMP in  a.m.   Hyperbilirubinemia -Mild and T. bili is now improved and is 0.7 -Continue to monitor and trend and repeat CMP in the a.m.   Leukocytosis -Unclear etiology and trended up and went to 14.5 when it was normal -Check blood cultures x2, urinalysis was done and showed a cloudy appearance with amber-colored urine, moderate hemoglobin, 20 ketones, moderate leukocytes, negative nitrites, few bacteria, 21-50 RBCs per high-power field, 11-20 squamous epithelial cells -Chest x-ray done and showed "Small right pleural effusion with adjacent right basilar opacities, could be atelectasis or infection.   Dilated loops of small bowel in the upper left abdomen, correlate with exam and consider abdominal radiographs." -KUB done and showed "There is abnormal dilation of small-bowel loops suggesting possible partial small bowel obstruction. If clinically warranted, follow-up CT may be considered. Possible small right renal stone." -CT Abd/Pelvis done and showed "There is wall thickening and inflammation of ileal loops and terminal ileum compatible with nonspecific enteritis. There is resulted upstream small bowel obstruction. Rectosigmoid colon wall thickening compatible with proctocolitis. Perirectal lymphadenopathy has mildly decreased. . New small volume ascites and anasarca.. New small right pleural effusion." -Appreciate further evaluation by GI and they are considering doing a flex sig but they are recommending rechecking a C. difficile and urine culture and they are going to review the case and CT findings with Dr. Abbey Chatters and general surgery for further evaluation. -Repeat CBC in a.m.    Sinus Tachycardia, improving -Patient's heart rate was elevated and remains persistently elevated -Likely physiologic in the setting of above and in the setting of her diarrhea but she does have a history of tachycardia -IVF now stopped  -Patient is refusing telemetry -C/w metoprolol 25 mg p.o. twice daily and if  necessary can uptitrate and  may need to call cardiology for further evaluation  DVT prophylaxis: Place TED hose Start: 11/06/21 1034 heparin injection 5,000 Units Start: 10/30/21 2215 SCDs Start: 10/30/21 2209    Code Status: Full Code Family Communication: Discussed with son at bedside   Disposition Plan:  Level of care: Telemetry Status is: Inpatient Remains inpatient appropriate because: Continues to have significant diarrhea and does not feel as well.   Consultants:  Medical Oncology  Gastroenterology Palliative Care Medicine Placed General Surgery consultation   Procedures:  As delineated as above  Antimicrobials:  Anti-infectives (From admission, onward)    None       Subjective: Seen and examined at bedside and states that she is feeling "rough".  Still having some quite significant diarrhea and states that the breathing treatments that she was getting made her feel sick.  She is requesting her home inhaler be added.  She is resistant to getting a flex sig at this time and hoping that she improves.  Denies any other concerns or complaints at this time.  Objective: Vitals:   11/11/21 0500 11/11/21 0608 11/11/21 0629 11/11/21 1331  BP:  (!) 92/49 (!) 89/49 116/82  Pulse:  86  (!) 107  Resp:  14  20  Temp:  98 F (36.7 C)  (!) 97.4 F (36.3 C)  TempSrc:    Oral  SpO2:  100%  100%  Weight: 60.3 kg     Height:        Intake/Output Summary (Last 24 hours) at 11/11/2021 1637 Last data filed at 11/11/2021 0000 Gross per 24 hour  Intake 638.75 ml  Output --  Net 638.75  ml   Filed Weights   11/09/21 0642 11/10/21 0516 11/11/21 0500  Weight: 60 kg 60.7 kg 60.3 kg   Examination: Physical Exam:  Constitutional: Thin elderly chronically ill-appearing Caucasian female who appears a little uncomfortable Respiratory: Diminished to auscultation bilaterally, no wheezing, rales, rhonchi or crackles. Normal respiratory effort and patient is not tachypenic. No accessory  muscle use.  Unlabored breathing Cardiovascular: RRR, no murmurs / rubs / gallops. S1 and S2 auscultated.  Has 1+ lower extremity edema Abdomen: Soft, little tender.-tender, distended secondary body habitus.  Bowel sounds positive.  GU: Deferred. Musculoskeletal: No clubbing / cyanosis of digits/nails. No joint deformity upper and lower extremities.  Skin: No rashes, lesions, ulcers on limited skin evaluation. No induration; Warm and dry.  Neurologic: CN 2-12 grossly intact with no focal deficits.  Romberg sign and cerebellar reflexes not assessed.  Psychiatric: Normal judgment and insight. Alert and oriented x 3. Normal mood and appropriate affect.   Data Reviewed: I have personally reviewed following labs and imaging studies  CBC: Recent Labs  Lab 11/07/21 0256 11/08/21 0534 11/09/21 0638 11/10/21 0603 11/11/21 0350  WBC 6.9 6.8 7.8 14.3* 14.5*  NEUTROABS 5.1 6.4 6.4 13.6* 13.5*  HGB 11.4* 11.8* 11.5* 11.8* 11.6*  HCT 33.3* 34.5* 33.2* 35.1* 34.4*  MCV 92.8 93.2 92.0 93.4 95.3  PLT 255 246 222 239 209   Basic Metabolic Panel: Recent Labs  Lab 11/07/21 0256 11/08/21 0534 11/09/21 0638 11/10/21 0603 11/11/21 0350  NA 132* 131* 133* 134* 134*  K 4.2 4.3 3.6 4.1 4.7  CL 101 100 100 101 103  CO2 '23 22 25 23 '$ 19*  GLUCOSE 97 99 96 95 78  BUN '19 20 22 23 '$ 24*  CREATININE 0.69 0.74 0.74 0.72 0.76  CALCIUM 7.3* 7.3* 7.1* 7.1* 6.8*  MG 1.9 1.9 2.0 2.1 2.2  PHOS 2.9 2.8 3.2 2.8 2.7   GFR: Estimated Creatinine Clearance: 47.9 mL/min (by C-G formula based on SCr of 0.76 mg/dL). Liver Function Tests: Recent Labs  Lab 11/07/21 0256 11/08/21 0534 11/09/21 0638 11/10/21 0603 11/11/21 0350  AST '19 18 17 19 18  '$ ALT '17 16 15 16 18  '$ ALKPHOS 54 57 58 66 71  BILITOT 1.0 1.0 1.3* 1.2 0.7  PROT 3.8* 3.8* 3.7* 3.7* 3.6*  ALBUMIN 1.8* 1.8* 1.7* 1.7* 1.5*   No results for input(s): "LIPASE", "AMYLASE" in the last 168 hours. No results for input(s): "AMMONIA" in the last 168  hours. Coagulation Profile: No results for input(s): "INR", "PROTIME" in the last 168 hours. Cardiac Enzymes: No results for input(s): "CKTOTAL", "CKMB", "CKMBINDEX", "TROPONINI" in the last 168 hours. BNP (last 3 results) No results for input(s): "PROBNP" in the last 8760 hours. HbA1C: No results for input(s): "HGBA1C" in the last 72 hours. CBG: No results for input(s): "GLUCAP" in the last 168 hours. Lipid Profile: No results for input(s): "CHOL", "HDL", "LDLCALC", "TRIG", "CHOLHDL", "LDLDIRECT" in the last 72 hours. Thyroid Function Tests: No results for input(s): "TSH", "T4TOTAL", "FREET4", "T3FREE", "THYROIDAB" in the last 72 hours. Anemia Panel: No results for input(s): "VITAMINB12", "FOLATE", "FERRITIN", "TIBC", "IRON", "RETICCTPCT" in the last 72 hours. Sepsis Labs: No results for input(s): "PROCALCITON", "LATICACIDVEN" in the last 168 hours.  No results found for this or any previous visit (from the past 240 hour(s)).   Radiology Studies: CT ABDOMEN PELVIS W CONTRAST  Result Date: 11/10/2021 CLINICAL DATA:  Abdominal pain. Patient is currently on chemo and radiation for rectal cancer. EXAM: CT ABDOMEN AND PELVIS WITH CONTRAST  TECHNIQUE: Multidetector CT imaging of the abdomen and pelvis was performed using the standard protocol following bolus administration of intravenous contrast. RADIATION DOSE REDUCTION: This exam was performed according to the departmental dose-optimization program which includes automated exposure control, adjustment of the mA and/or kV according to patient size and/or use of iterative reconstruction technique. CONTRAST:  34m OMNIPAQUE IOHEXOL 300 MG/ML  SOLN COMPARISON:  CT chest abdomen and pelvis 08/21/2021. MRI abdomen 09/29/2021. FINDINGS: Lower chest: There is a new small right pleural effusion. Hepatobiliary: Again seen is a calcified granuloma in the liver. No new hepatic lesions are seen. Gallbladder and bile ducts are within normal limits. Pancreas:  Unremarkable. No pancreatic ductal dilatation or surrounding inflammatory changes. Spleen: Normal in size without focal abnormality. Adrenals/Urinary Tract: Adrenal glands are unremarkable. Kidneys are normal, without renal calculi, focal lesion, or hydronephrosis. Bladder is unremarkable. Stomach/Bowel: There is circumferential wall thickening and mild inflammation of the rectosigmoid colon. Air-fluid levels are seen throughout the remaining colon. Multiple small bowel loops in the pelvis and the terminal ileum demonstrate circumferential wall thickening with associated mesenteric edema and mild surrounding inflammation compatible with enteritis. There is upstream dilatation of small bowel with air-fluid levels measuring up to 3.2 cm. There is a large hiatal hernia. The stomach is nondilated. No evidence for pneumatosis, portal venous gas, or free air. Appendix is not seen. Vascular/Lymphatic: Aorta and IVC are normal in size. There are prominent perirectal lymph nodes present. These have decreased in size when compared to the prior study. No new enlarged lymph nodes are seen. Reproductive: Uterus and bilateral adnexa are unremarkable. Other: There is new small amount of free fluid throughout the abdomen and pelvis. There is new body wall edema. No focal abdominal wall hernia. Musculoskeletal: No acute or significant osseous findings. IMPRESSION: 1. There is wall thickening and inflammation of ileal loops and terminal ileum compatible with nonspecific enteritis. There is resulted upstream small bowel obstruction. 2. Rectosigmoid colon wall thickening compatible with proctocolitis. 3. Perirectal lymphadenopathy has mildly decreased. 4. New small volume ascites and anasarca. 5. New small right pleural effusion. Electronically Signed   By: ARonney AstersM.D.   On: 11/10/2021 18:31   DG Abd 2 Views  Result Date: 11/10/2021 CLINICAL DATA:  Diarrhea, abdominal pain EXAM: ABDOMEN - 2 VIEW COMPARISON:  Chest  radiographs done earlier today FINDINGS: There is abnormal dilation of multiple small bowel loops in upper abdomen measuring up to 4.3 cm in diameter. Stomach is not distended. Colon is not distended. There is no pneumoperitoneum. There is 3 mm calcific density overlying the right kidney, possibly renal stone. Right pleural effusion is seen. IMPRESSION: There is abnormal dilation of small-bowel loops suggesting possible partial small bowel obstruction. If clinically warranted, follow-up CT may be considered. Possible small right renal stone. Electronically Signed   By: PElmer PickerM.D.   On: 11/10/2021 13:27   DG CHEST PORT 1 VIEW  Result Date: 11/10/2021 CLINICAL DATA:  Leukocytosis EXAM: PORTABLE CHEST 1 VIEW COMPARISON:  CT 08/21/2021 FINDINGS: The cardiomediastinal silhouette is within normal limits. There is a small right pleural effusion and adjacent right basilar opacities. Skin folds overlie the chest bilaterally. No evidence of pneumothorax. Dilated loops of small bowel in the left upper abdomen. IMPRESSION: Small right pleural effusion with adjacent right basilar opacities, could be atelectasis or infection. Dilated loops of small bowel in the upper left abdomen, correlate with exam and consider abdominal radiographs. Electronically Signed   By: JMaurine SimmeringM.D.   On: 11/10/2021  10:06    Scheduled Meds:  cholestyramine  4 g Oral Daily   diphenoxylate-atropine  2 tablet Oral Q6H   feeding supplement (KATE [employer]ARMS STANDARD 1.4)  325 mL Oral Daily   Fluticasone Furoate  1 Inhalation Inhalation Daily   heparin  5,000 Units Subcutaneous Q8H   lactose free nutrition  237 mL Oral TID WC   metoprolol tartrate  25 mg Oral BID   multivitamin with minerals  1 tablet Oral Daily   octreotide  150 mcg Subcutaneous TID   pantoprazole  40 mg Oral Daily   Continuous Infusions:   LOS: 12 days   Raiford Noble, DO [facility] Available via Epic secure chat 7am-7pm After these hours, please  refer to coverage provider listed on [email or web address] 11/11/2021, 4:37 PM

## 2021-11-11 NOTE — Progress Notes (Signed)
Pt's frequency and volume of diarrhea stools has decreased significantly today, only small smears noted in adult diaper and small dot mixed with urine in BSC. Have been unable to obtain stool specimen for c-diff or urine specimen for culture this shift. Pt's left arm noted to have some swelling & redness, IVF were infusing at 10 ml/hr and upon closer inspection it is noted that IV has infiltrated. Site removed, left arm elevated.  Pt has only taken sips of liquids today, states everytime she puts something in her mouth she has the urge to have BM and feels bloated and uncomfortable. Have encouraged pt to continue frequent sips of liquids and italian ice, states understanding.  Pt given first dose of Octreotide, educated on purpose of med, possible side effects to report, pt states understanding.

## 2021-11-11 NOTE — Consult Note (Signed)
[facility] Consultation Oncology  Name: Regina Knox      MRN: [id number]    Location: A305/A305-01  Date: 11/11/2021 Time:1:07 PM   REFERRING PHYSICIAN: Dr. Alfredia Ferguson  REASON FOR CONSULT: Intractable diarrhea from chemoradiation therapy   DIAGNOSIS: Stage III rectal cancer  HISTORY OF PRESENT ILLNESS: Ms. Regina Knox is a 76 year old pleasant female who is seen in consultation today at the request of Dr. Alfredia Ferguson.  She was started on Xeloda with radiation therapy on 10/02/2021.  She was last seen by me in the office on 10/23/2021.  She presented to the ER on 10/30/2021 and was admitted to the hospital with intractable diarrhea.  She is still having uncontrolled diarrhea while she is on loperamide and Lomotil.  Initial C. difficile testing was negative on 10/31/2021.  GI panel was also negative..  She is followed by GI service.  A CT scan of the abdomen and pelvis done today showed ileitis and colitis with mild ascites.  Decrease in size of perirectal lymph nodes seen.  She is reporting 9-10 watery bowel movements in the last 24 hours.  She has bowel movement every time she eats.  She had elevated white count for the last couple of days.  She has been afebrile.  PAST MEDICAL HISTORY:   Past Medical History:  Diagnosis Date   Allergy    Anemia    Asthma    Elevated cholesterol    GERD (gastroesophageal reflux disease)    Hypertension     ALLERGIES: Allergies  Allergen Reactions   Ibuprofen Rash      MEDICATIONS: I have reviewed the patient's current medications.     PAST SURGICAL HISTORY Past Surgical History:  Procedure Laterality Date   BREAST SURGERY  80's   right breast   ORIF ANKLE FRACTURE Right 11/06/2013   Procedure: OPEN REDUCTION INTERNAL FIXATION (ORIF) ANKLE FRACTURE;  Surgeon: Sanjuana Kava, MD;  Location: [facility];  Service: Orthopedics;  Laterality: Right;   TUBAL LIGATION      FAMILY HISTORY: Family History  Problem Relation Age of Onset   Hypertension Mother     Diabetes Mother    Hypertension Father    Heart disease Father    Cancer Sister        brain, breast   Hypertension Sister    Diabetes Sister    Hypertension Sister    Heart disease Brother        congestive heart failure   Diabetes Maternal Grandmother    Diabetes Paternal Grandmother    Hypertension Son    Diabetes Son    Stomach cancer Neg Hx    Esophageal cancer Neg Hx    Colon cancer Neg Hx     SOCIAL HISTORY:  reports that she has never smoked. She has never used smokeless tobacco. She reports that she does not drink alcohol and does not use drugs.  PERFORMANCE STATUS: The patient's performance status is 2 - Symptomatic, <50% confined to bed  PHYSICAL EXAM: Most Recent Vital Signs: Blood pressure (!) 89/49, pulse 86, temperature 98 F (36.7 C), resp. rate 14, height 5' 3" (1.6 m), weight 132 lb 15 oz (60.3 kg), last menstrual period 05/25/1996, SpO2 100 %. BP 116/82 (BP Location: Left Arm)   Pulse (!) 107   Temp (!) 97.4 F (36.3 C) (Oral)   Resp 20   Ht 5' 3" (1.6 m)   Wt 132 lb 15 oz (60.3 kg) Comment: Pt too weak for standing weight  LMP 05/25/1996  SpO2 100%   BMI 23.55 kg/m  General appearance: alert, cooperative, and appears stated age Lungs: clear to auscultation bilaterally Abdomen:  Soft, tenderness in the lower quadrants.  No clearly palpable masses. Extremities:  No edema or cyanosis. Neurologic: Grossly normal  LABORATORY DATA:  Results for orders placed or performed during the hospital encounter of 10/30/21 (from the past 48 hour(s))  CBC with Differential/Platelet     Status: Abnormal   Collection Time: 11/10/21  6:03 AM  Result Value Ref Range   WBC 14.3 (H) 4.0 - 10.5 K/uL   RBC 3.76 (L) 3.87 - 5.11 MIL/uL   Hemoglobin 11.8 (L) 12.0 - 15.0 g/dL   HCT 35.1 (L) 36.0 - 46.0 %   MCV 93.4 80.0 - 100.0 fL   MCH 31.4 26.0 - 34.0 pg   MCHC 33.6 30.0 - 36.0 g/dL   RDW 23.6 (H) 11.5 - 15.5 %   Platelets 239 150 - 400 K/uL   nRBC 0.3 (H) 0.0 -  0.2 %   Neutrophils Relative % 60 %   Neutro Abs 13.6 (H) 1.7 - 7.7 K/uL   Band Neutrophils 35 %   Lymphocytes Relative 2 %   Lymphs Abs 0.3 (L) 0.7 - 4.0 K/uL   Monocytes Relative 0 %   Monocytes Absolute 0.0 (L) 0.1 - 1.0 K/uL   Eosinophils Relative 0 %   Eosinophils Absolute 0.0 0.0 - 0.5 K/uL   Basophils Relative 0 %   Basophils Absolute 0.0 0.0 - 0.1 K/uL   WBC Morphology INCREASED BANDS (>20% BANDS)    Smear Review MORPHOLOGY UNREMARKABLE    Metamyelocytes Relative 3 %   Abs Immature Granulocytes 0.40 (H) 0.00 - 0.07 K/uL   Polychromasia PRESENT     Comment: Performed at [facility], [address] [place], [place] [address]  Comprehensive metabolic panel     Status: Abnormal   Collection Time: 11/10/21  6:03 AM  Result Value Ref Range   Sodium 134 (L) 135 - 145 mmol/L   Potassium 4.1 3.5 - 5.1 mmol/L   Chloride 101 98 - 111 mmol/L   CO2 23 22 - 32 mmol/L   Glucose, Bld 95 70 - 99 mg/dL    Comment: Glucose reference range applies only to samples taken after fasting for at least 8 hours.   BUN 23 8 - 23 mg/dL   Creatinine, Ser 0.72 0.44 - 1.00 mg/dL   Calcium 7.1 (L) 8.9 - 10.3 mg/dL   Total Protein 3.7 (L) 6.5 - 8.1 g/dL   Albumin 1.7 (L) 3.5 - 5.0 g/dL   AST 19 15 - 41 U/L   ALT 16 0 - 44 U/L   Alkaline Phosphatase 66 38 - 126 U/L   Total Bilirubin 1.2 0.3 - 1.2 mg/dL   GFR, Estimated >60 >60 mL/min    Comment: (NOTE) Calculated using the CKD-EPI Creatinine Equation (2021)    Anion gap 10 5 - 15    Comment: Performed at [facility] [place], [address], [place], [place] [address]  Phosphorus     Status: None   Collection Time: 11/10/21  6:03 AM  Result Value Ref Range   Phosphorus 2.8 2.5 - 4.6 mg/dL    Comment: Performed at [facility], [address] [place], [place] [address]  Magnesium     Status: None   Collection Time: 11/10/21  6:03 AM  Result Value Ref Range   Magnesium 2.1 1.7 - 2.4 mg/dL    Comment: Performed at [facility],  [address]  [place], [place] [address]  Urinalysis, Routine w reflex microscopic     Status: Abnormal   Collection Time: 11/10/21  9:04 AM  Result Value Ref Range   Color, Urine AMBER (A) YELLOW    Comment: BIOCHEMICALS MAY BE AFFECTED BY COLOR   APPearance CLOUDY (A) CLEAR   Specific Gravity, Urine 1.027 1.005 - 1.030   pH 5.0 5.0 - 8.0   Glucose, UA NEGATIVE NEGATIVE mg/dL   Hgb urine dipstick MODERATE (A) NEGATIVE   Bilirubin Urine NEGATIVE NEGATIVE   Ketones, ur 20 (A) NEGATIVE mg/dL   Protein, ur 30 (A) NEGATIVE mg/dL   Nitrite NEGATIVE NEGATIVE   Leukocytes,Ua MODERATE (A) NEGATIVE   RBC / HPF 21-50 0 - 5 RBC/hpf   WBC, UA 6-10 0 - 5 WBC/hpf   Bacteria, UA FEW (A) NONE SEEN   Squamous Epithelial / LPF 11-20 0 - 5   Mucus PRESENT     Comment: Performed at [facility], [address], [place], [place] [address]  Comprehensive metabolic panel     Status: Abnormal   Collection Time: 11/11/21  3:50 AM  Result Value Ref Range   Sodium 134 (L) 135 - 145 mmol/L   Potassium 4.7 3.5 - 5.1 mmol/L   Chloride 103 98 - 111 mmol/L   CO2 19 (L) 22 - 32 mmol/L   Glucose, Bld 78 70 - 99 mg/dL    Comment: Glucose reference range applies only to samples taken after fasting for at least 8 hours.   BUN 24 (H) 8 - 23 mg/dL   Creatinine, Ser 0.76 0.44 - 1.00 mg/dL   Calcium 6.8 (L) 8.9 - 10.3 mg/dL   Total Protein 3.6 (L) 6.5 - 8.1 g/dL   Albumin 1.5 (L) 3.5 - 5.0 g/dL   AST 18 15 - 41 U/L   ALT 18 0 - 44 U/L   Alkaline Phosphatase 71 38 - 126 U/L   Total Bilirubin 0.7 0.3 - 1.2 mg/dL   GFR, Estimated >60 >60 mL/min    Comment: (NOTE) Calculated using the CKD-EPI Creatinine Equation (2021)    Anion gap 12 5 - 15    Comment: Performed at [facility], [address] [place], [place] [address]  CBC with Differential/Platelet     Status: Abnormal   Collection Time: 11/11/21  3:50 AM  Result Value Ref Range   WBC 14.5 (H) 4.0 - 10.5 K/uL   RBC 3.61 (L) 3.87 - 5.11 MIL/uL   Hemoglobin 11.6 (L) 12.0 - 15.0  g/dL   HCT 34.4 (L) 36.0 - 46.0 %   MCV 95.3 80.0 - 100.0 fL   MCH 32.1 26.0 - 34.0 pg   MCHC 33.7 30.0 - 36.0 g/dL   RDW 24.1 (H) 11.5 - 15.5 %   Platelets 208 150 - 400 K/uL   nRBC 0.1 0.0 - 0.2 %   Neutrophils Relative % 52 %   Neutro Abs 13.5 (H) 1.7 - 7.7 K/uL   Band Neutrophils 41 %   Lymphocytes Relative 4 %   Lymphs Abs 0.6 (L) 0.7 - 4.0 K/uL   Monocytes Relative 1 %   Monocytes Absolute 0.1 0.1 - 1.0 K/uL   Eosinophils Relative 0 %   Eosinophils Absolute 0.0 0.0 - 0.5 K/uL   Basophils Relative 0 %   Basophils Absolute 0.0 0.0 - 0.1 K/uL   WBC Morphology INCREASED BANDS (>20% BANDS)     Comment: MILD LEFT SHIFT (1-5% METAS, OCC MYELO, OCC BANDS)  RBC Morphology POLYCHROMASIA    Smear Review MORPHOLOGY UNREMARKABLE    Metamyelocytes Relative 2 %   Abs Immature Granulocytes 0.30 (H) 0.00 - 0.07 K/uL   Polychromasia PRESENT     Comment: Performed at [facility], [address] [place], [place] [address]  Phosphorus     Status: None   Collection Time: 11/11/21  3:50 AM  Result Value Ref Range   Phosphorus 2.7 2.5 - 4.6 mg/dL    Comment: Performed at [facility], [address], [place], [place] [address]  Magnesium     Status: None   Collection Time: 11/11/21  3:50 AM  Result Value Ref Range   Magnesium 2.2 1.7 - 2.4 mg/dL    Comment: Performed at [facility], [address], [place], [place] [address]      RADIOGRAPHY: CT ABDOMEN PELVIS W CONTRAST  Result Date: 11/10/2021 CLINICAL DATA:  Abdominal pain. Patient is currently on chemo and radiation for rectal cancer. EXAM: CT ABDOMEN AND PELVIS WITH CONTRAST TECHNIQUE: Multidetector CT imaging of the abdomen and pelvis was performed using the standard protocol following bolus administration of intravenous contrast. RADIATION DOSE REDUCTION: This exam was performed according to the departmental dose-optimization program which includes automated exposure control, adjustment of the mA and/or kV according to patient size  and/or use of iterative reconstruction technique. CONTRAST:  42m OMNIPAQUE IOHEXOL 300 MG/ML  SOLN COMPARISON:  CT chest abdomen and pelvis 08/21/2021. MRI abdomen 09/29/2021. FINDINGS: Lower chest: There is a new small right pleural effusion. Hepatobiliary: Again seen is a calcified granuloma in the liver. No new hepatic lesions are seen. Gallbladder and bile ducts are within normal limits. Pancreas: Unremarkable. No pancreatic ductal dilatation or surrounding inflammatory changes. Spleen: Normal in size without focal abnormality. Adrenals/Urinary Tract: Adrenal glands are unremarkable. Kidneys are normal, without renal calculi, focal lesion, or hydronephrosis. Bladder is unremarkable. Stomach/Bowel: There is circumferential wall thickening and mild inflammation of the rectosigmoid colon. Air-fluid levels are seen throughout the remaining colon. Multiple small bowel loops in the pelvis and the terminal ileum demonstrate circumferential wall thickening with associated mesenteric edema and mild surrounding inflammation compatible with enteritis. There is upstream dilatation of small bowel with air-fluid levels measuring up to 3.2 cm. There is a large hiatal hernia. The stomach is nondilated. No evidence for pneumatosis, portal venous gas, or free air. Appendix is not seen. Vascular/Lymphatic: Aorta and IVC are normal in size. There are prominent perirectal lymph nodes present. These have decreased in size when compared to the prior study. No new enlarged lymph nodes are seen. Reproductive: Uterus and bilateral adnexa are unremarkable. Other: There is new small amount of free fluid throughout the abdomen and pelvis. There is new body wall edema. No focal abdominal wall hernia. Musculoskeletal: No acute or significant osseous findings. IMPRESSION: 1. There is wall thickening and inflammation of ileal loops and terminal ileum compatible with nonspecific enteritis. There is resulted upstream small bowel obstruction. 2.  Rectosigmoid colon wall thickening compatible with proctocolitis. 3. Perirectal lymphadenopathy has mildly decreased. 4. New small volume ascites and anasarca. 5. New small right pleural effusion. Electronically Signed   By: ARonney AstersM.D.   On: 11/10/2021 18:31   DG Abd 2 Views  Result Date: 11/10/2021 CLINICAL DATA:  Diarrhea, abdominal pain EXAM: ABDOMEN - 2 VIEW COMPARISON:  Chest radiographs done earlier today FINDINGS: There is abnormal dilation of multiple small bowel loops in upper abdomen measuring up to 4.3 cm in diameter. Stomach is not distended. Colon is not  distended. There is no pneumoperitoneum. There is 3 mm calcific density overlying the right kidney, possibly renal stone. Right pleural effusion is seen. IMPRESSION: There is abnormal dilation of small-bowel loops suggesting possible partial small bowel obstruction. If clinically warranted, follow-up CT may be considered. Possible small right renal stone. Electronically Signed   By: Elmer Picker M.D.   On: 11/10/2021 13:27   DG CHEST PORT 1 VIEW  Result Date: 11/10/2021 CLINICAL DATA:  Leukocytosis EXAM: PORTABLE CHEST 1 VIEW COMPARISON:  CT 08/21/2021 FINDINGS: The cardiomediastinal silhouette is within normal limits. There is a small right pleural effusion and adjacent right basilar opacities. Skin folds overlie the chest bilaterally. No evidence of pneumothorax. Dilated loops of small bowel in the left upper abdomen. IMPRESSION: Small right pleural effusion with adjacent right basilar opacities, could be atelectasis or infection. Dilated loops of small bowel in the upper left abdomen, correlate with exam and consider abdominal radiographs. Electronically Signed   By: Maurine Simmering M.D.   On: 11/10/2021 10:06         ASSESSMENT:  1.  Stage III (T3N2) mid to high rectal cancer, MMR preserved: - Chemoradiation therapy with Xeloda started on 10/02/2021 - Last radiation and Xeloda on 10/30/2021  2.  Social/family history: -  She lived by herself at home.  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  She was self employed prior to that.  Non-smoker.  Sister had metastatic breast cancer.  Paternal aunt had cancer.  PLAN:  1.  Intractable diarrhea: - I have reviewed CT [facility] with contrast from 11/10/2021: Wall thickening and inflammation of ileal loops in the terminal ileum with resulting upstream small bowel obstruction.  Rectosigmoid colon wall thickening, patible with proctocolitis.  Perirectal lymphadenopathy is mildly decreased.  New small volume ascites and anasarca and small right pleural effusion. - Ileitis likely from radiation therapy as it is confined to the pelvis and terminal ileum. - Would recommend octreotide 150 mcg subcu 3 times daily for better control of loperamide resistant diarrhea.  2.  Stage III rectal cancer: - Treatment on hold since 10/31/2021 due to admission for intractable diarrhea. - She tells me today that she does not want to pursue any further treatment for her rectal cancer.  3.  Hypoalbuminemia: - Severe malnutrition contributing to albumin 1.5.  This is causing mild ascites and anasarca. - She is not eating much as she is afraid that she will have diarrhea.  She will increase his protein intake once diarrhea is controlled.    All questions were answered. The patient knows to call the clinic with any problems, questions or concerns. We can certainly see the patient much sooner if necessary.    Derek Jack

## 2021-11-11 NOTE — Progress Notes (Cosign Needed Addendum)
Subjective: Reports 9-10 watery BMs in the last 24 hours. Mild lower abdominal pain prior to a BM that improves thereafter. No brbpr or melena. No nausea or vomiting. Still not eating because every time she eats, she feels she has to have a BM. Eating doesn't cause nausea. No abdominal distension.   No history of IBD.   CT with rectosigmoid colon wall thickening compatible with proctocolitis, multiple small bowel loops in the pelvis and TI demonstrates circumferential wall thickening with associated mesenteric edema, surrounding inflammation compatible with enteritis with upstream small bowel obstruction with dilation of small bowel with air-fluid levels measuring 3.2 cm.  New small volume ascites and anasarca.  New small right pleural effusion.  Perirectal lymphadenopathy mildly decreased.  WBC count 14.5. BP soft UA yesterday with moderate leukocytes and few bacteria.    Objective: Vital signs in last 24 hours: Temp:  [98 F (36.7 C)-98.7 F (37.1 C)] 98 F (36.7 C) (09/26 8101) Pulse Rate:  [81-86] 86 (09/26 0608) Resp:  [14-17] 14 (09/26 0608) BP: (89-112)/(49-63) 89/49 (09/26 0629) SpO2:  [99 %-100 %] 100 % (09/26 7510) Weight:  [60.3 kg] 60.3 kg (09/26 0500) Last BM Date : 11/10/21 General:   Alert and oriented, pleasant, frail, chronically ill appearing.  Head:  Normocephalic and atraumatic. Abdomen:  Bowel sounds present, soft, non-distended. Mild TTP in RLQ and suprapubic area. No HSM or hernias noted. No rebound or guarding. No masses appreciated  Extremities:  With 1-2 + pedal edema and 1+ edema in LE.  Neurologic:  Alert and  oriented x4;  grossly normal neurologically. Skin:  Warm and dry, intact without significant lesions.  Psych:  Normal mood and affect.  Intake/Output from previous day: 09/25 0701 - 09/26 0700 In: 638.8 [P.O.:60; I.V.:578.8] Out: -  Intake/Output this shift: No intake/output data recorded.  Lab Results: Recent Labs    11/09/21 0638  11/10/21 0603 11/11/21 0350  WBC 7.8 14.3* 14.5*  HGB 11.5* 11.8* 11.6*  HCT 33.2* 35.1* 34.4*  PLT 222 239 208   BMET Recent Labs    11/09/21 0638 11/10/21 0603 11/11/21 0350  NA 133* 134* 134*  K 3.6 4.1 4.7  CL 100 101 103  CO2 25 23 19*  GLUCOSE 96 95 78  BUN 22 23 24*  CREATININE 0.74 0.72 0.76  CALCIUM 7.1* 7.1* 6.8*   LFT Recent Labs    11/09/21 0638 11/10/21 0603 11/11/21 0350  PROT 3.7* 3.7* 3.6*  ALBUMIN 1.7* 1.7* 1.5*  AST '17 19 18  '$ ALT '15 16 18  '$ ALKPHOS 58 66 71  BILITOT 1.3* 1.2 0.7     Studies/Results: CT ABDOMEN PELVIS W CONTRAST  Result Date: 11/10/2021 CLINICAL DATA:  Abdominal pain. Patient is currently on chemo and radiation for rectal cancer. EXAM: CT ABDOMEN AND PELVIS WITH CONTRAST TECHNIQUE: Multidetector CT imaging of the abdomen and pelvis was performed using the standard protocol following bolus administration of intravenous contrast. RADIATION DOSE REDUCTION: This exam was performed according to the departmental dose-optimization program which includes automated exposure control, adjustment of the mA and/or kV according to patient size and/or use of iterative reconstruction technique. CONTRAST:  84m OMNIPAQUE IOHEXOL 300 MG/ML  SOLN COMPARISON:  CT chest abdomen and pelvis 08/21/2021. MRI abdomen 09/29/2021. FINDINGS: Lower chest: There is a new small right pleural effusion. Hepatobiliary: Again seen is a calcified granuloma in the liver. No new hepatic lesions are seen. Gallbladder and bile ducts are within normal limits. Pancreas: Unremarkable. No pancreatic ductal dilatation or  surrounding inflammatory changes. Spleen: Normal in size without focal abnormality. Adrenals/Urinary Tract: Adrenal glands are unremarkable. Kidneys are normal, without renal calculi, focal lesion, or hydronephrosis. Bladder is unremarkable. Stomach/Bowel: There is circumferential wall thickening and mild inflammation of the rectosigmoid colon. Air-fluid levels are seen  throughout the remaining colon. Multiple small bowel loops in the pelvis and the terminal ileum demonstrate circumferential wall thickening with associated mesenteric edema and mild surrounding inflammation compatible with enteritis. There is upstream dilatation of small bowel with air-fluid levels measuring up to 3.2 cm. There is a large hiatal hernia. The stomach is nondilated. No evidence for pneumatosis, portal venous gas, or free air. Appendix is not seen. Vascular/Lymphatic: Aorta and IVC are normal in size. There are prominent perirectal lymph nodes present. These have decreased in size when compared to the prior study. No new enlarged lymph nodes are seen. Reproductive: Uterus and bilateral adnexa are unremarkable. Other: There is new small amount of free fluid throughout the abdomen and pelvis. There is new body wall edema. No focal abdominal wall hernia. Musculoskeletal: No acute or significant osseous findings. IMPRESSION: 1. There is wall thickening and inflammation of ileal loops and terminal ileum compatible with nonspecific enteritis. There is resulted upstream small bowel obstruction. 2. Rectosigmoid colon wall thickening compatible with proctocolitis. 3. Perirectal lymphadenopathy has mildly decreased. 4. New small volume ascites and anasarca. 5. New small right pleural effusion. Electronically Signed   By: Ronney Asters M.D.   On: 11/10/2021 18:31   DG Abd 2 Views  Result Date: 11/10/2021 CLINICAL DATA:  Diarrhea, abdominal pain EXAM: ABDOMEN - 2 VIEW COMPARISON:  Chest radiographs done earlier today FINDINGS: There is abnormal dilation of multiple small bowel loops in upper abdomen measuring up to 4.3 cm in diameter. Stomach is not distended. Colon is not distended. There is no pneumoperitoneum. There is 3 mm calcific density overlying the right kidney, possibly renal stone. Right pleural effusion is seen. IMPRESSION: There is abnormal dilation of small-bowel loops suggesting possible partial  small bowel obstruction. If clinically warranted, follow-up CT may be considered. Possible small right renal stone. Electronically Signed   By: Elmer Picker M.D.   On: 11/10/2021 13:27   DG CHEST PORT 1 VIEW  Result Date: 11/10/2021 CLINICAL DATA:  Leukocytosis EXAM: PORTABLE CHEST 1 VIEW COMPARISON:  CT 08/21/2021 FINDINGS: The cardiomediastinal silhouette is within normal limits. There is a small right pleural effusion and adjacent right basilar opacities. Skin folds overlie the chest bilaterally. No evidence of pneumothorax. Dilated loops of small bowel in the left upper abdomen. IMPRESSION: Small right pleural effusion with adjacent right basilar opacities, could be atelectasis or infection. Dilated loops of small bowel in the upper left abdomen, correlate with exam and consider abdominal radiographs. Electronically Signed   By: Maurine Simmering M.D.   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  GI consulted for intractable diarrhea.    Acute on chronic diarrhea:  At baseline having some diarrhea in the setting of near circumferential nonobstructing tumor in the rectum but per patient was manageable.  Diarrhea worsened approximately 2 weeks into chemoradiation treatments becoming intractable the last week of treatment. Per patient, last treatment was 9/13. Since admission, stool studies including GIP and C. difficile negative. She  has been on Lomotil 2 tablets QID and Questran 4g daily without any significant improvement in diarrhea. Noted increasing WBC count yesterday and small bowel dilation captured on CXR. Follow-up abdominal x-ray with possible partial small bowel obstruction. CT A/P with IV contrast  only ectosigmoid colon wall thickening compatible with proctocolitis, multiple small bowel loops in the pelvis and TI demonstrates circumferential wall thickening with associated mesenteric edema, surrounding inflammation compatible with enteritis with upstream small bowel obstruction with dilation of small bowel with air-fluid levels measuring 3.2 cm.  New small volume ascites and anasarca.  New small right pleural effusion.  Perirectal lymphadenopathy mildly decreased. WBC count up a little more to 14.5 today. BP soft.  Clinically, she isn't presenting as someone that has complete small bowel obstruction, possibly partial small bowel obstruction related to ileitis.  She continues with frequent diarrhea with mild lower abdominal pain prior to bowel movements that improves thereafter.  Denies nausea, vomiting, abdominal distention.   Etiology of ileitis isn't clear. Overall, I feel that we are likely dealing with side effects from Xeloda as this carries high risk of GI toxicity and radiation that she has been receiving. Due to increasing white count, will recheck C. difficile to ensure this was not missed. I will review her case and CT findings further with Dr. Abbey Chatters for further recommendations.   Leukocytosis: Possibly multifactorial in the setting of ileitis, possible UTI as UA had moderate amount of leukocytes and few bacteria.  Needs urine culture.  Blood cultures drawn yesterday with no results yet. We are rechecking C. difficile.    Plan: Recheck C diff, Urine culture Review case/CT findings with Dr. Abbey Chatters for additional recommendations.  Continue scheduled Lomotil Continue Questran 4g daily.    LOS: 12 days    11/11/2021, 7:12 AM   Aliene Altes, [facility]

## 2021-11-12 DIAGNOSIS — R197 Diarrhea, unspecified: Secondary | ICD-10-CM | POA: Diagnosis not present

## 2021-11-12 DIAGNOSIS — L8990 Pressure ulcer of unspecified site, unspecified stage: Secondary | ICD-10-CM | POA: Insufficient documentation

## 2021-11-12 DIAGNOSIS — Z515 Encounter for palliative care: Secondary | ICD-10-CM | POA: Diagnosis not present

## 2021-11-12 DIAGNOSIS — Z7189 Other specified counseling: Secondary | ICD-10-CM | POA: Diagnosis not present

## 2021-11-12 LAB — COMPREHENSIVE METABOLIC PANEL
ALT: 18 U/L (ref 0–44)
AST: 17 U/L (ref 15–41)
Albumin: 1.6 g/dL — ABNORMAL LOW (ref 3.5–5.0)
Alkaline Phosphatase: 84 U/L (ref 38–126)
Anion gap: 11 (ref 5–15)
BUN: 24 mg/dL — ABNORMAL HIGH (ref 8–23)
CO2: 18 mmol/L — ABNORMAL LOW (ref 22–32)
Calcium: 7.1 mg/dL — ABNORMAL LOW (ref 8.9–10.3)
Chloride: 103 mmol/L (ref 98–111)
Creatinine, Ser: 0.84 mg/dL (ref 0.44–1.00)
GFR, Estimated: >60 mL/min (ref >60)
Glucose, Bld: 116 mg/dL — ABNORMAL HIGH (ref 70–99)
Potassium: 4.6 mmol/L (ref 3.5–5.1)
Sodium: 132 mmol/L — ABNORMAL LOW (ref 135–145)
Total Bilirubin: 1.1 mg/dL (ref 0.3–1.2)
Total Protein: 3.9 g/dL — ABNORMAL LOW (ref 6.5–8.1)

## 2021-11-12 LAB — CBC WITH DIFFERENTIAL/PLATELET
Abs Immature Granulocytes: 2.1 10*3/uL — ABNORMAL HIGH (ref 0.00–0.07)
Band Neutrophils: 2 %
Basophils Absolute: 0 10*3/uL (ref 0.0–0.1)
Basophils Relative: 0 %
Eosinophils Absolute: 0.1 10*3/uL (ref 0.0–0.5)
Eosinophils Relative: 1 %
HCT: 34.6 % — ABNORMAL LOW (ref 36.0–46.0)
Hemoglobin: 11.6 g/dL — ABNORMAL LOW (ref 12.0–15.0)
Lymphocytes Relative: 5 %
Lymphs Abs: 0.7 10*3/uL (ref 0.7–4.0)
MCH: 31.4 pg (ref 26.0–34.0)
MCHC: 33.5 g/dL (ref 30.0–36.0)
MCV: 93.5 fL (ref 80.0–100.0)
Metamyelocytes Relative: 11 %
Monocytes Absolute: 1.1 10*3/uL — ABNORMAL HIGH (ref 0.1–1.0)
Monocytes Relative: 8 %
Myelocytes: 3 %
Neutro Abs: 9.8 10*3/uL — ABNORMAL HIGH (ref 1.7–7.7)
Neutrophils Relative %: 69 %
Platelets: 213 10*3/uL (ref 150–400)
Promyelocytes Relative: 1 %
RBC: 3.7 MIL/uL — ABNORMAL LOW (ref 3.87–5.11)
RDW: 24.5 % — ABNORMAL HIGH (ref 11.5–15.5)
WBC: 13.8 10*3/uL — ABNORMAL HIGH (ref 4.0–10.5)
nRBC: 0.1 % (ref 0.0–0.2)

## 2021-11-12 LAB — C DIFFICILE (CDIFF) QUICK SCRN (NO PCR REFLEX)
C Diff antigen: NEGATIVE
C Diff interpretation: NOT DETECTED
C Diff toxin: NEGATIVE

## 2021-11-12 LAB — PHOSPHORUS: Phosphorus: 3.1 mg/dL (ref 2.5–4.6)

## 2021-11-12 LAB — MAGNESIUM: Magnesium: 2.2 mg/dL (ref 1.7–2.4)

## 2021-11-12 MED ORDER — SODIUM BICARBONATE 650 MG PO TABS
650.0000 mg | ORAL_TABLET | Freq: Two times a day (BID) | ORAL | Status: DC
  Administered 2021-11-12 – 2021-11-20 (×17): 650 mg via ORAL
  Filled 2021-11-12 (×18): qty 1

## 2021-11-12 NOTE — Progress Notes (Signed)
Physical Therapy Treatment Patient Details Name: Regina Knox MRN: [id number] DOB: [birth year] Today's Date: 11/12/2021   History of Present Illness Regina Knox is a 76 y.o. female with medical history significant of anemia, asthma, hyperlipidemia, GERD, hypertension, colorectal cancer currently getting radiation, and more presents to the ED with a chief complaint of intractable diarrhea.  Patient reports that she has had diarrhea for 2 weeks.  Its associated with the radiation, which she started 3 weeks ago for colorectal cancer.  Patient reports that since that time she has had 7-8 watery bowel movements per day.  They are nonbloody.  She does have associated abdominal cramping that is relieved after she has a bowel movement.  Patient reports that anything she eats or drinks seems to go straight through her and she has diarrhea immediately after.  She reports it is even noticeable with the IV fluids.  She has taken Lomotil and Imodium with no relief at home.  She had nausea and vomiting x1 that was nonbloody emesis.  Her last normal meal was day prior to presentation.  She has had a decreased appetite.  Patient does report some dysuria that she attributes to dehydration.  She had some generalized weakness which has been expected with radiation.  Patient has no other complaints at this time.    PT Comments    Patient demonstrates slow labored movement for sitting up at bedside, very unsteady on feet and unable to stand without use of AD due to weakness.  Patient limited to a few steps at bedside with frequent leaning over RW due to poor standing balance, weakness, c/o fatigue and had to transfer to [facility] due to diarrhea.  Patient tolerated sitting up in chair after therapy with her son present in room.  Patient will benefit from continued skilled physical therapy in hospital and recommended venue below to increase strength, balance, endurance for safe ADLs and gait.      Recommendations for follow up  therapy are one component of a multi-disciplinary discharge planning process, led by the attending physician.  Recommendations may be updated based on patient status, additional functional criteria and insurance authorization.  Follow Up Recommendations  Skilled nursing-short term rehab (<3 hours/day) Can patient physically be transported by private vehicle: Yes   Assistance Recommended at Discharge Set up Supervision/Assistance  Patient can return home with the following A lot of help with bathing/dressing/bathroom;A lot of help with walking and/or transfers;Help with stairs or ramp for entrance;Assistance with cooking/housework   Equipment Recommendations  None recommended by PT    Recommendations for Other Services       Precautions / Restrictions Precautions Precautions: Fall Restrictions Weight Bearing Restrictions: No     Mobility  Bed Mobility Overal bed mobility: Needs Assistance Bed Mobility: Supine to Sit     Supine to sit: Min assist, Mod assist, HOB elevated     General bed mobility comments: increased time, labored movement with HOB partially rasied    Transfers Overall transfer level: Needs assistance Equipment used: Rolling walker (2 wheels) Transfers: Sit to/from Stand, Bed to chair/wheelchair/BSC Sit to Stand: Mod assist   Step pivot transfers: Mod assist, Min assist       General transfer comment: unsteady labored movement having to rely on walker for support due to BLE weakness    Ambulation/Gait Ambulation/Gait assistance: Mod assist Gait Distance (Feet): 10 Feet Assistive device: Rolling walker (2 wheels) Gait Pattern/deviations: Decreased step length - right, Decreased step length - left, Decreased stride length, Trunk  flexed Gait velocity: decreased     General Gait Details: limited to a few side steps, and steps forward/backwards at bedside due to generalized weakness and c/o fatigue   Stairs             Wheelchair Mobility     Modified Rankin (Stroke Patients Only)       Balance Overall balance assessment: Needs assistance Sitting-balance support: Feet supported, No upper extremity supported Sitting balance-Leahy Scale: Fair Sitting balance - Comments: fair/good seated at EOB   Standing balance support: During functional activity, No upper extremity supported Standing balance-Leahy Scale: Poor Standing balance comment: fair/poor using RW                            Cognition Arousal/Alertness: Awake/alert Behavior During Therapy: WFL for tasks assessed/performed Overall Cognitive Status: Within Functional Limits for tasks assessed                                          Exercises General Exercises - Lower Extremity Long Arc Quad: Seated, AROM, Strengthening, Both, 10 reps Hip Flexion/Marching: Seated, AROM, Strengthening, Both, 10 reps Toe Raises: Seated, AROM, Strengthening, Both, 10 reps Heel Raises: Seated, AROM, Strengthening, Both, 10 reps    General Comments        Pertinent Vitals/Pain Pain Assessment Pain Assessment: No/denies pain    Home Living                          Prior Function            PT Goals (current goals can now be found in the care plan section) Acute Rehab PT Goals Patient Stated Goal: return home PT Goal Formulation: With patient Time For Goal Achievement: 11/28/21 Potential to Achieve Goals: Good Progress towards PT goals: Progressing toward goals    Frequency    Min 3X/week      PT Plan Discharge plan needs to be updated    Co-evaluation              AM-PAC PT "6 Clicks" Mobility   Outcome Measure  Help needed turning from your back to your side while in a flat bed without using bedrails?: A Little Help needed moving from lying on your back to sitting on the side of a flat bed without using bedrails?: A Lot Help needed moving to and from a bed to a chair (including a wheelchair)?: A Lot Help  needed standing up from a chair using your arms (e.g., wheelchair or bedside chair)?: A Lot Help needed to walk in hospital room?: A Lot Help needed climbing 3-5 steps with a railing? : A Lot 6 Click Score: 13    End of Session   Activity Tolerance: Patient tolerated treatment well;Patient limited by fatigue Patient left: in chair;with call bell/phone within reach;with family/visitor present Nurse Communication: Mobility status PT Visit Diagnosis: Unsteadiness on feet (R26.81);Other abnormalities of gait and mobility (R26.89);Muscle weakness (generalized) (M62.81)     Time: 2595-6387 PT Time Calculation (min) (ACUTE ONLY): 27 min  Charges:  $Therapeutic Exercise: 8-22 mins $Therapeutic Activity: 8-22 mins                     12:20 PM, 11/12/21 Lonell Grandchild, MPT Physical Therapist with [facility] [phone] office [phone] mobile  phone

## 2021-11-12 NOTE — Progress Notes (Signed)
Palliative: [name] is resting quietly in bed.  She wakes easily when I enter the room.  She greets me, making and somewhat keeping eye contact.  She appears acutely/chronically ill and somewhat frail.  She is alert and oriented, able to make her basic needs known.  There is no family at bedside at this time.  We talk about her acute health concerns with diarrhea which seems to have slowed.  I offer encouragement.  We talk about her nutritional state.  She is increasing her nutritional drink supplementation.    We talk about her visit with Dr. Raliegh Ip yesterday.  She shares that she is not interested in further chemotherapy/radiation treatment, BUT is agreeable to surgery if it is still offered.  Her son, Regina Knox, is working diligently to manage appointments and has been in contact with the surgeons office.  They have an appointment early October with a tentative plan for surgery end of November/first of December.  Conference with attending, bedside nursing staff, transition of care team related to patient condition, needs, goals of care, disposition.  Plan: At this point continue full scope/full code.  No further chemotherapy/radiation treatment.  Agreeable to surgery as offered.  Anticipate need for short-term rehab with ultimate goal to return home.  Would benefit from outpatient palliative services.  55 minutes Quinn Axe, NP Palliative medicine team Team phone [phone] Greater than 50% of this time was spent counseling and coordinating care related to the above assessment and plan.

## 2021-11-12 NOTE — Progress Notes (Addendum)
PROGRESS NOTE  Regina Knox  NID:[id number] DOB: May 26, 1945 DOA: 10/30/2021 PCP: Chevis Pretty, FNP   Brief Narrative:  Patient is a 76 year old female with history of anemia, asthma, hyperlipidemia, GERD, hypertension, colorectal cancer who presented with intractable diarrhea secondary to recent radiation therapy, weakness.  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  Oncology, general surgery, palliative care following.  Diarrhea did not improve with loperamide.  Currently on octreotide.   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  No leukocytosis or fever.  No abdominal pain.  C. difficile, GI pathogen panel negative.  Vomiting has resolved.  IV fluids stopped due to volume overload.  Continue Lomotil, octreotide.  GI was consulted who recommended Lomotil and Questran.  Recent CT abdomen/pelvis showed circumferential wall thickening, mild inflammation of the rectosigmoid colon, air-fluid level throughout the remaining colon, multiple small bowel loops in the pelvis.  Findings consistent with enteritis.  Palliative care also consulted for goals of care. Currently on clear liquid diet, will advance diet to full liquid.  Will advance as tolerated  History of rectal cancer: Patient was on Xeloda and was receiving radiation treatment.  Patient is not interested on continuing chemotherapy  COPD: Currently stable.  Continue bronchodilators as needed  Hypokalemia: Supplemented and corrected  Non-anion gap metabolic disease: Likely due to loss of bicarb through diarrhea.  Continue sodium bicarb tablets  Hypertension: At home was on olmesartan, hydrochlorothiazide currently  holding.  Blood pressure soft  Peripheral edema :  in the setting of IV fluids, hypoalbuminemia.  IV fluids discontinued.  Given a dose of Lasix.  Continue to monitor  Normocytic anemia: Currently hemoglobin stable  Hypercholesterolemia: Continue supplementation and monitoring  Leukocytosis: Mild.  Cultures have been negative so far.  UA was not consistent with UTI.  Chest x-ray did not show pneumonia.  Sinus tachycardia: Improved.  Weakness/debility: We will consult PT/OT when appropriate    Nutrition Problem: Increased nutrient needs Etiology: acute illness, cancer and cancer related treatments, chronic illness    DVT prophylaxis:Place TED hose Start: 11/06/21 1034 heparin injection 5,000 Units Start: 10/30/21 2215 SCDs Start: 10/30/21 2209     Code Status: Full Code  Family Communication: None at bedside  Patient status:Inpatient  Patient is from :Home  Anticipated discharge RW:[facility] versus SNF  Estimated DC date:Not sure   Consultants: GI, general surgery, palliative care  Procedures: None  Antimicrobials:  Anti-infectives (From admission, onward)    None       Subjective: Patient seen and examined the bedside today.  Found to be very deconditioned, weak, lying in bed.  She says her diarrhea is getting better slowly.  She had a loose bowel movement this morning.  She was eating her breakfast.  Denies any abdomen pain, nausea or vomiting.  Whenever she eats, she says diarrhea occurs.  Complains of weakness  Objective: Vitals:   11/11/21 1331 11/11/21 2041 11/12/21 0446 11/12/21 0552  BP: 116/82 (!) 86/50  (!) 94/52  Pulse: (!) 107 77  91  Resp: '20 19  16  '$ Temp: (!) 97.4 F (36.3 C) (!) 97.4 F (36.3 C)  (!) 97.4 F (36.3 C)  TempSrc: Oral     SpO2: 100% 98%  98%  Weight:   63.8  kg   Height:       No intake or output data in the 24 hours ending 11/12/21 0748 Filed Weights   11/10/21 0516 11/11/21 0500 11/12/21 0446  Weight: 60.7 kg 60.3 kg 63.8  kg    Examination:  General exam: Weak appearing, chronically ill looking HEENT: PERRL Respiratory system:  no wheezes or crackles  Cardiovascular system: S1 & S2 heard, RRR.  Gastrointestinal system: Abdomen is mildly distended, soft and nontender.  Slow bowel sounds present Central nervous system: Alert and oriented Extremities: 2-3+ bilateral lower extremity pitting edema, no clubbing ,no cyanosis Skin: No rashes, no ulcers,no icterus     Data Reviewed: I have personally reviewed following labs and imaging studies  CBC: Recent Labs  Lab 11/08/21 0534 11/09/21 0638 11/10/21 0603 11/11/21 0350 11/12/21 0418  WBC 6.8 7.8 14.3* 14.5* 13.8*  NEUTROABS 6.4 6.4 13.6* 13.5* 9.8*  HGB 11.8* 11.5* 11.8* 11.6* 11.6*  HCT 34.5* 33.2* 35.1* 34.4* 34.6*  MCV 93.2 92.0 93.4 95.3 93.5  PLT 246 222 239 208 938   Basic Metabolic Panel: Recent Labs  Lab 11/08/21 0534 11/09/21 0638 11/10/21 0603 11/11/21 0350 11/12/21 0418  NA 131* 133* 134* 134* 132*  K 4.3 3.6 4.1 4.7 4.6  CL 100 100 101 103 103  CO2 '22 25 23 '$ 19* 18*  GLUCOSE 99 96 95 78 116*  BUN '20 22 23 '$ 24* 24*  CREATININE 0.74 0.74 0.72 0.76 0.84  CALCIUM 7.3* 7.1* 7.1* 6.8* 7.1*  MG 1.9 2.0 2.1 2.2 2.2  PHOS 2.8 3.2 2.8 2.7 3.1     Recent Results (from the past 240 hour(s))  Culture, blood (Routine X 2) w Reflex to ID Panel     Status: None (Preliminary result)   Collection Time: 11/10/21  9:35 AM   Specimen: BLOOD  Result Value Ref Range Status   Specimen Description BLOOD RIGHT ASSIST CONTROL  Final   Special Requests   Final    BOTTLES DRAWN AEROBIC AND ANAEROBIC Blood Culture adequate volume   Culture   Final    NO GROWTH 2 DAYS Performed at [facility], [address], [place], [place] [address]    Report Status PENDING  Incomplete  Culture, blood (Routine X 2) w Reflex to ID Panel     Status: None (Preliminary result)   Collection Time: 11/10/21  9:35 AM   Specimen: BLOOD  Result Value Ref Range Status    Specimen Description BLOOD RIGHT HAND  Final   Special Requests   Final    BOTTLES DRAWN AEROBIC AND ANAEROBIC Blood Culture adequate volume   Culture   Final    NO GROWTH 2 DAYS Performed at [facility], [address], [place], [place] [address]    Report Status PENDING  Incomplete     Radiology Studies: CT ABDOMEN PELVIS W CONTRAST  Result Date: 11/10/2021 CLINICAL DATA:  Abdominal pain. Patient is currently on chemo and radiation for rectal cancer. EXAM: CT ABDOMEN AND PELVIS WITH CONTRAST TECHNIQUE: Multidetector CT imaging of the abdomen and pelvis was performed using the standard protocol following bolus administration of intravenous contrast. RADIATION DOSE REDUCTION: This exam was performed according to the departmental dose-optimization program which includes automated exposure control, adjustment of the mA and/or kV according to patient size and/or use of iterative reconstruction technique. CONTRAST:  37m OMNIPAQUE IOHEXOL 300 MG/ML  SOLN COMPARISON:  CT chest abdomen and pelvis 08/21/2021. MRI abdomen 09/29/2021. FINDINGS: Lower chest: There is a new small right pleural effusion.  Hepatobiliary: Again seen is a calcified granuloma in the liver. No new hepatic lesions are seen. Gallbladder and bile ducts are within normal limits. Pancreas: Unremarkable. No pancreatic ductal dilatation or surrounding inflammatory changes. Spleen: Normal in size without focal abnormality. Adrenals/Urinary Tract: Adrenal glands are unremarkable. Kidneys are normal, without renal calculi, focal lesion, or hydronephrosis. Bladder is unremarkable. Stomach/Bowel: There is circumferential wall thickening and mild inflammation of the rectosigmoid colon. Air-fluid levels are seen throughout the remaining colon. Multiple small bowel loops in the pelvis and the terminal ileum demonstrate circumferential wall thickening with associated mesenteric edema and mild surrounding inflammation compatible with enteritis. There  is upstream dilatation of small bowel with air-fluid levels measuring up to 3.2 cm. There is a large hiatal hernia. The stomach is nondilated. No evidence for pneumatosis, portal venous gas, or free air. Appendix is not seen. Vascular/Lymphatic: Aorta and IVC are normal in size. There are prominent perirectal lymph nodes present. These have decreased in size when compared to the prior study. No new enlarged lymph nodes are seen. Reproductive: Uterus and bilateral adnexa are unremarkable. Other: There is new small amount of free fluid throughout the abdomen and pelvis. There is new body wall edema. No focal abdominal wall hernia. Musculoskeletal: No acute or significant osseous findings. IMPRESSION: 1. There is wall thickening and inflammation of ileal loops and terminal ileum compatible with nonspecific enteritis. There is resulted upstream small bowel obstruction. 2. Rectosigmoid colon wall thickening compatible with proctocolitis. 3. Perirectal lymphadenopathy has mildly decreased. 4. New small volume ascites and anasarca. 5. New small right pleural effusion. Electronically Signed   By: Ronney Asters M.D.   On: 11/10/2021 18:31   DG Abd 2 Views  Result Date: 11/10/2021 CLINICAL DATA:  Diarrhea, abdominal pain EXAM: ABDOMEN - 2 VIEW COMPARISON:  Chest radiographs done earlier today FINDINGS: There is abnormal dilation of multiple small bowel loops in upper abdomen measuring up to 4.3 cm in diameter. Stomach is not distended. Colon is not distended. There is no pneumoperitoneum. There is 3 mm calcific density overlying the right kidney, possibly renal stone. Right pleural effusion is seen. IMPRESSION: There is abnormal dilation of small-bowel loops suggesting possible partial small bowel obstruction. If clinically warranted, follow-up CT may be considered. Possible small right renal stone. Electronically Signed   By: Elmer Picker M.D.   On: 11/10/2021 13:27   DG CHEST PORT 1 VIEW  Result Date:  11/10/2021 CLINICAL DATA:  Leukocytosis EXAM: PORTABLE CHEST 1 VIEW COMPARISON:  CT 08/21/2021 FINDINGS: The cardiomediastinal silhouette is within normal limits. There is a small right pleural effusion and adjacent right basilar opacities. Skin folds overlie the chest bilaterally. No evidence of pneumothorax. Dilated loops of small bowel in the left upper abdomen. IMPRESSION: Small right pleural effusion with adjacent right basilar opacities, could be atelectasis or infection. Dilated loops of small bowel in the upper left abdomen, correlate with exam and consider abdominal radiographs. Electronically Signed   By: Maurine Simmering M.D.   On: 11/10/2021 10:06    Scheduled Meds:  cholestyramine  4 g Oral Daily   diphenoxylate-atropine  2 tablet Oral Q6H   feeding supplement (KATE FARMS STANDARD 1.4)  325 mL Oral Daily   Fluticasone Furoate  1 Inhalation Inhalation Daily   heparin  5,000 Units Subcutaneous Q8H   lactose free nutrition  237 mL Oral TID WC   metoprolol tartrate  25 mg Oral BID   multivitamin with minerals  1 tablet Oral Daily   octreotide  150  mcg Subcutaneous TID   pantoprazole  40 mg Oral Daily   Continuous Infusions:   LOS: 13 days   Shelly Coss, MD [facility] P9/27/2023, 7:48 AM

## 2021-11-12 NOTE — Progress Notes (Signed)
Patient slept through the night. Unable to obtain stool and urine samples, when patient has to urinate the samples become compromised with urine even with hat on bedside commode. Blood pressures running low, held Metoprolol Dr. Josephine Cables notified. Continued to monitor patient. Patient refusing telemetry and heparin.

## 2021-11-12 NOTE — Plan of Care (Signed)
  Problem: Education: Goal: Knowledge of General Education information will improve Description: Including pain rating scale, medication(s)/side effects and non-pharmacologic comfort measures Outcome: Progressing   Problem: Clinical Measurements: Goal: Ability to maintain clinical measurements within normal limits will improve Outcome: Progressing Goal: Diagnostic test results will improve Outcome: Progressing Goal: Cardiovascular complication will be avoided Outcome: Progressing   Problem: Elimination: Goal: Will not experience complications related to urinary retention Outcome: Progressing   Problem: Activity: Goal: Risk for activity intolerance will decrease Outcome: Not Progressing ++Pt remains extremely weak and requires 2 full person assist for standing and transferring. PT did evaluate pt today.   Problem: Nutrition: Goal: Adequate nutrition will be maintained Outcome: Not Progressing ++Pt continues to avoid eating because eating stimulates her to have diarrhea. Encouraged by staff and MD to eat even if it results in loose stools because she needs the nutrition to begin to heal her gut and improve her fluid volume status.   Problem: Elimination: Goal: Will not experience complications related to bowel motility Outcome: Progressing ++ Stool frequency, size and type is improving. Pt continues to have irritation to perineum, rectal area.   Problem: Skin Integrity: Goal: Risk for impaired skin integrity will decrease Outcome: Not Progressing  ++ Pt has developed serous blisters x4 on her left medial buttock and a 1cm x 1cm stage 2 pressure ulcer on her right medial buttock. Pt has been reticent to turn Q2h. Generally moves self in bed. Areas cleaned, and foam dressing applied.

## 2021-11-12 NOTE — Progress Notes (Signed)
Subjective: Diarrhea seems to be improving.  She reports 4-5 bowel movements in the last 24 hours.  Nurse at bedside reporting stool volume seems to be decreasing and also consistency improving, now more applesauce consistency rather than complete water.  No BRBPR, melena.  Mild lower abdominal pain prior to bowel movements that improves or after.  No nausea or vomiting.  Drinking boost breeze.  Had small amount of clear liquids at breakfast.  Still does not have much of an appetite, but is willing to try increasing intake as diarrhea is improving.  Objective: Vital signs in last 24 hours: Temp:  [97.4 F (36.3 C)] 97.4 F (36.3 C) (09/27 0552) Pulse Rate:  [77-107] 91 (09/27 0552) Resp:  [16-20] 16 (09/27 0552) BP: (86-116)/(50-82) 94/52 (09/27 0552) SpO2:  [98 %-100 %] 98 % (09/27 0552) Weight:  [63.8 kg] 63.8 kg (09/27 0446) Last BM Date : 11/12/21 General:   Alert and oriented, pleasant, appears weak and frail, chronically ill, in bedside recliner.  Head:  Normocephalic and atraumatic. Eyes:  No icterus, sclera clear. Conjuctiva pink.  Mouth:  Without lesions, mucosa pink and moist.  Lungs: Clear to auscultation bilaterally, without wheezing, rales, or rhonchi.  Abdomen:  Bowel sounds present, soft, non-distended. Mild suprapubic TTP.  No HSM or hernias noted. No rebound or guarding. No masses appreciated  Msk:  Symmetrical without gross deformities. Normal posture. Extremities:  Without 1+ pitting edema below the knees early.  Compression stockings in place. Neurologic:  Alert and  oriented x4;  grossly normal neurologically. Skin:  Warm and dry, intact without significant lesions.  Psych:  Normal mood and affect.  Intake/Output from previous day: No intake/output data recorded. Intake/Output this shift: Total I/O In: 120 [P.O.:120] Out: -   Lab Results: Recent Labs    11/10/21 0603 11/11/21 0350 11/12/21 0418  WBC 14.3* 14.5* 13.8*  HGB 11.8* 11.6* 11.6*  HCT  35.1* 34.4* 34.6*  PLT 239 208 213   BMET Recent Labs    11/10/21 0603 11/11/21 0350 11/12/21 0418  NA 134* 134* 132*  K 4.1 4.7 4.6  CL 101 103 103  CO2 23 19* 18*  GLUCOSE 95 78 116*  BUN 23 24* 24*  CREATININE 0.72 0.76 0.84  CALCIUM 7.1* 6.8* 7.1*   LFT Recent Labs    11/10/21 0603 11/11/21 0350 11/12/21 0418  PROT 3.7* 3.6* 3.9*  ALBUMIN 1.7* 1.5* 1.6*  AST '19 18 17  '$ ALT '16 18 18  '$ ALKPHOS 66 71 84  BILITOT 1.2 0.7 1.1     Studies/Results: CT ABDOMEN PELVIS W CONTRAST  Result Date: 11/10/2021 CLINICAL DATA:  Abdominal pain. Patient is currently on chemo and radiation for rectal cancer. EXAM: CT ABDOMEN AND PELVIS WITH CONTRAST TECHNIQUE: Multidetector CT imaging of the abdomen and pelvis was performed using the standard protocol following bolus administration of intravenous contrast. RADIATION DOSE REDUCTION: This exam was performed according to the departmental dose-optimization program which includes automated exposure control, adjustment of the mA and/or kV according to patient size and/or use of iterative reconstruction technique. CONTRAST:  78m OMNIPAQUE IOHEXOL 300 MG/ML  SOLN COMPARISON:  CT chest abdomen and pelvis 08/21/2021. MRI abdomen 09/29/2021. FINDINGS: Lower chest: There is a new small right pleural effusion. Hepatobiliary: Again seen is a calcified granuloma in the liver. No new hepatic lesions are seen. Gallbladder and bile ducts are within normal limits. Pancreas: Unremarkable. No pancreatic ductal dilatation or surrounding inflammatory changes. Spleen: Normal in size without focal abnormality. Adrenals/Urinary Tract:  Adrenal glands are unremarkable. Kidneys are normal, without renal calculi, focal lesion, or hydronephrosis. Bladder is unremarkable. Stomach/Bowel: There is circumferential wall thickening and mild inflammation of the rectosigmoid colon. Air-fluid levels are seen throughout the remaining colon. Multiple small bowel loops in the pelvis and the  terminal ileum demonstrate circumferential wall thickening with associated mesenteric edema and mild surrounding inflammation compatible with enteritis. There is upstream dilatation of small bowel with air-fluid levels measuring up to 3.2 cm. There is a large hiatal hernia. The stomach is nondilated. No evidence for pneumatosis, portal venous gas, or free air. Appendix is not seen. Vascular/Lymphatic: Aorta and IVC are normal in size. There are prominent perirectal lymph nodes present. These have decreased in size when compared to the prior study. No new enlarged lymph nodes are seen. Reproductive: Uterus and bilateral adnexa are unremarkable. Other: There is new small amount of free fluid throughout the abdomen and pelvis. There is new body wall edema. No focal abdominal wall hernia. Musculoskeletal: No acute or significant osseous findings. IMPRESSION: 1. There is wall thickening and inflammation of ileal loops and terminal ileum compatible with nonspecific enteritis. There is resulted upstream small bowel obstruction. 2. Rectosigmoid colon wall thickening compatible with proctocolitis. 3. Perirectal lymphadenopathy has mildly decreased. 4. New small volume ascites and anasarca. 5. New small right pleural effusion. Electronically Signed   By: Ronney Asters M.D.   On: 11/10/2021 18:31    Assessment: 76 year old lady with a history of C diff in January 2023, persistent mild diarrhea thereafter and found to have 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  GI consulted for intractable diarrhea.   Acute on chronic diarrhea:  At baseline having some diarrhea in the setting of near circumferential nonobstructing tumor in the rectum but per patient was manageable.  Diarrhea worsened approximately 2 weeks into chemoradiation treatments becoming intractable the last week of  treatment. Per patient, last treatment was 9/13. Since admission, stool studies including GIP and C. difficile negative.  She has been on Lomotil 2 tablets 4 times daily and Questran 4 g daily without significant improvement in diarrhea.  Noted increasing WBC count on 9/25.  CT A/P with IV contrast only with rectosigmoid colon wall thickening compatible with proctocolitis, multiple small bowel loops in the pelvis and TI demonstrates circumferential wall thickening with associated mesenteric edema, surrounding inflammation compatible with enteritis with upstream small bowel obstruction with dilation of small bowel with air-fluid levels measuring 3.2 cm.  New volume ascites and anasarca.  New small right pleural effusion. Perirectal lymphadenopathy mildly decreased.   Clinically, she has not presented as someone with complete bowel obstruction, possible partial small bowel obstruction related to ileitis, but she has no significant abdominal pain aside from discomfort prior to bowel movements, no nausea or vomiting.  Etiology of diarrhea, ileitis, proctocolitis is suspected to be secondary to chemo/radiation therapy.  Due to persistent diarrhea and increasing WBC count, C diff was reordered yesterday, but just collected this morning.  CMV PCR also ordered yesterday and is pending.  Oncology was also consulted and recommended starting octreotide 3 times daily.  Encouragingly, patient is reporting improvement in diarrhea this morning with 4-5 bowel movements total yesterday, down from 9 to 10/day and stool consistency improving. I am hopeful that she will continue to see improvement over the next 24-48 hours.   Leukocytosis: Possibly multifactorial in the setting of ileitis, possible UTI as  UA had moderate amount of leukocytes and few bacteria. Urine culture in process.  Blood cultures drawn 9/25 with no growth x2 days.  We have rechecked C. difficile in light of persistent diarrhea discussed above and this is  pending.  Leukocytosis improving slowly with WBC 13.8, down from 14.5 yesterday.  Remains afebrile.  Plan: Follow-up C. difficile result. Continue Lomotil 4 times daily. Questran 4 g daily. Continue octreotide 3 times daily. Agree with advancing to full liquid diet for lunch and if she tolerates this well, consider advancing to soft diet for dinner.     LOS: 13 days    11/12/2021, 12:04 PM   Aliene Altes, [facility]

## 2021-11-12 NOTE — [place] FL2 (Signed)
Accident MEDICAID FL2 LEVEL OF CARE SCREENING TOOL     IDENTIFICATION  Patient Name: Regina Knox Birthdate: 1945/12/09 Sex: female Admission Date (Current Location): 10/30/2021  [facility] [place] Number:  [employer] and Address:  [place] [address], [place]      Provider Number: [id number]  Attending Physician Name and Address:  Shelly Coss, MD  Relative Name and Phone Number:       Current Level of Care: Hospital Recommended Level of Care: [place] Prior Approval Number:    Date Approved/Denied:   PASRR Number: [id number]  Discharge Plan: SNF    Current Diagnoses: Patient Active Problem List   Diagnosis Date Noted   Enteritis    Proctitis    Malignant neoplasm of colon ([place])    Hypokalemia 10/31/2021   Protein-calorie malnutrition, moderate ([place]) 10/31/2021   Hypocalcemia 10/31/2021   Intractable diarrhea 10/30/2021   Anemia 09/23/2021   Gastroesophageal reflux disease without esophagitis 09/23/2021   Syncope and collapse 09/23/2021   Rectal cancer ([place]) 09/16/2021   BMI 27.0-27.9,adult 11/30/2014   Irregular heart beat 11/30/2014   Asthma, chronic 05/25/2012   Essential hypertension, benign 05/25/2012   Benign essential hypertension 05/25/2012    Orientation RESPIRATION BLADDER Height & Weight     Self, Time, Situation, Place  Normal Continent Weight: 140 lb 10.5 oz (63.8 kg) Height:  '5\' 3"'$  (160 cm)  BEHAVIORAL SYMPTOMS/MOOD NEUROLOGICAL BOWEL NUTRITION STATUS      Incontinent Diet (Full liquid. See d/c summary for updates.)  AMBULATORY STATUS COMMUNICATION OF NEEDS Skin   Extensive Assist Verbally Normal                       Personal Care Assistance Level of Assistance  Bathing, Feeding, Dressing Bathing Assistance: Maximum assistance Feeding assistance: Limited assistance Dressing Assistance: Maximum assistance     Functional Limitations Info  Sight, Hearing, Speech  Sight Info: Adequate Hearing Info: Adequate Speech Info: Adequate    SPECIAL CARE FACTORS FREQUENCY  PT (By licensed PT)     PT Frequency: 5x weekly              Contractures      Additional Factors Info  Code Status, Allergies, Isolation Precautions Code Status Info: Full code Allergies Info: Ibuprofen     Isolation Precautions Info: Enteric     Current Medications (11/12/2021):  This is the current hospital active medication list Current Facility-Administered Medications  Medication Dose Route Frequency Provider Last Rate Last Admin   acetaminophen (TYLENOL) tablet 650 mg  650 mg Oral Q6H PRN Zierle-Ghosh, Asia B, DO       Or   acetaminophen (TYLENOL) suppository 650 mg  650 mg Rectal Q6H PRN Zierle-Ghosh, Asia B, DO       albuterol (PROVENTIL) (2.5 MG/3ML) 0.083% nebulizer solution 2.5 mg  2.5 mg Nebulization Q4H PRN Mariel Aloe, MD       ALPRAZolam Duanne Moron) tablet 0.25 mg  0.25 mg Oral BID PRN Alfredia Ferguson, Omair Latif, DO   0.25 mg at 11/10/21 1347   calcium carbonate (TUMS - dosed in mg elemental calcium) chewable tablet 200 mg of elemental calcium  1 tablet Oral TID WC PRN Mariel Aloe, MD   200 mg of elemental calcium at 11/03/21 1811   cholestyramine (QUESTRAN) packet 4 g  4 g Oral Daily Mahala Menghini, PA-C   4 g at 11/11/21 1336   diphenoxylate-atropine (LOMOTIL) 2.5-0.025 MG  per tablet 2 tablet  2 tablet Oral Q6H Mahala Menghini, PA-C   2 tablet at 11/12/21 0553   feeding supplement (KATE FARMS STANDARD 1.4) liquid 325 mL  325 mL Oral Daily Raiford Noble [place], [place]   325 mL at 11/07/21 1126   Fluticasone Furoate AEPB 1 Inhalation  1 Inhalation Inhalation Daily Raiford Noble [place], DO   1 Inhalation at 11/12/21 0739   heparin injection 5,000 Units  5,000 Units Subcutaneous Q8H Zierle-Ghosh, Asia B, DO       lactose free nutrition (BOOST PLUS) liquid 237 mL  237 mL Oral TID WC Sheikh, Omair [place], DO   237 mL at 11/12/21 0800   multivitamin with minerals tablet 1  tablet  1 tablet Oral Daily Mariel Aloe, MD   1 tablet at 11/12/21 1134   octreotide (SANDOSTATIN) injection 150 mcg  150 mcg Subcutaneous TID Derek Jack, MD   150 mcg at 11/12/21 1133   ondansetron (ZOFRAN) tablet 4 mg  4 mg Oral Q6H PRN Zierle-Ghosh, Asia B, DO   4 mg at 11/06/21 9458   Or   ondansetron (ZOFRAN) injection 4 mg  4 mg Intravenous Q6H PRN Zierle-Ghosh, Asia B, DO       oxyCODONE (Oxy IR/ROXICODONE) immediate release tablet 5 mg  5 mg Oral Q4H PRN Zierle-Ghosh, Asia B, DO       pantoprazole (PROTONIX) EC tablet 40 mg  40 mg Oral Daily Mariel Aloe, MD   40 mg at 11/12/21 1134   sodium bicarbonate tablet 650 mg  650 mg Oral BID Shelly Coss, MD   650 mg at 11/12/21 1134     Discharge Medications: Please see discharge summary for a list of discharge medications.  Relevant Imaging Results:  Relevant Lab Results:   Additional Information SSN: [id number]  Salome Arnt, LCSW

## 2021-11-12 NOTE — Progress Notes (Signed)
   11/12/21 0000  Level of Consciousness  Level of Consciousness Alert  Complaints & Interventions  Diarrhea relieved by Nothing  PCA/Epidural/Spinal Assessment  Respiratory Pattern Regular;Unlabored  Glasgow Coma Scale  Eye Opening 4  Best Verbal Response (NON-intubated) 5  Best Motor Response 6  Glasgow Coma Scale Score 15  Provider Notification  Provider Name/Title Dr. Josephine Cables  Date Provider Notified 11/11/21  Time Provider Notified 2350  Method of Notification  (Secure chat)  Notification Reason Red med refusal (held Metoprolol:  BP 86/50)  Provider response No new orders  Date of Provider Response 11/11/21  Time of Provider Response 2358

## 2021-11-12 NOTE — TOC Progression Note (Signed)
Transition of Care [facility]) - Progression Note    Patient Details  Name: Regina Knox MRN: [id number] Date of Birth: 25-Aug-1945  Transition of Care [facility]) CM/SW Contact  Salome Arnt, [place] Phone Number: 11/12/2021, 11:40 AM  Clinical Narrative: PT now recommending SNF. LCSW discussed with pt's son as pt was not feeling well and defers to him. Son requests [place] or [place] SNF.  Will provide Medicare.gov list/ratings. TOC will start [employer] authorization when appropriate. Sarah with [facility] notified of need for SNF.      Expected Discharge Plan: [place] Barriers to Discharge: Continued Medical Work up  Expected Discharge Plan and Services Expected Discharge Plan: [place] Choice: [place] arrangements for the past 2 months: [place]: PT [place]: [place] Date [place]: 11/11/21   Representative spoke with at [place]: [place] Determinants of Health ([place]) Interventions Housing Interventions: Intervention Not Indicated  Readmission Risk Interventions    11/05/2021    1:45 PM  Readmission Risk Prevention Plan  Transportation Screening Complete  HRI or Real Complete  Social Work Consult for [place] Planning/Counseling Complete  Palliative Care Screening Not Applicable  Medication Review Press photographer) Complete

## 2021-11-12 NOTE — Progress Notes (Addendum)
Pt awake, alert but weak upon am assessment. Pt now requiring 2 full assist to get OOB to [facility] and back to bed due to weakness and leg buckling. Pt oriented x4, attempting to take sips of clear liquid breakfast tray and boost nutritional drink. Denies any c/o pain at present, just complains of "being so weak and tired." MD Tawanna Solo has been in this am to evaluate pt, pt states no questions or concerns at this time.

## 2021-11-12 NOTE — Progress Notes (Signed)
Pt continues to refuse SQ heparin and use of cardiac monitoring. MD Adhikari notified via [facility] chat.

## 2021-11-13 DIAGNOSIS — Z515 Encounter for palliative care: Secondary | ICD-10-CM | POA: Diagnosis not present

## 2021-11-13 DIAGNOSIS — R197 Diarrhea, unspecified: Secondary | ICD-10-CM | POA: Diagnosis not present

## 2021-11-13 DIAGNOSIS — Z7189 Other specified counseling: Secondary | ICD-10-CM | POA: Diagnosis not present

## 2021-11-13 DIAGNOSIS — K529 Noninfective gastroenteritis and colitis, unspecified: Secondary | ICD-10-CM | POA: Diagnosis not present

## 2021-11-13 DIAGNOSIS — K6289 Other specified diseases of anus and rectum: Secondary | ICD-10-CM | POA: Diagnosis not present

## 2021-11-13 LAB — CBC
HCT: 33.5 % — ABNORMAL LOW (ref 36.0–46.0)
Hemoglobin: 11.2 g/dL — ABNORMAL LOW (ref 12.0–15.0)
MCH: 31.6 pg (ref 26.0–34.0)
MCHC: 33.4 g/dL (ref 30.0–36.0)
MCV: 94.6 fL (ref 80.0–100.0)
Platelets: 189 10*3/uL (ref 150–400)
RBC: 3.54 MIL/uL — ABNORMAL LOW (ref 3.87–5.11)
RDW: 24.4 % — ABNORMAL HIGH (ref 11.5–15.5)
WBC: 8.2 10*3/uL (ref 4.0–10.5)
nRBC: 0 % (ref 0.0–0.2)

## 2021-11-13 LAB — BASIC METABOLIC PANEL
Anion gap: 5 (ref 5–15)
BUN: 23 mg/dL (ref 8–23)
CO2: 20 mmol/L — ABNORMAL LOW (ref 22–32)
Calcium: 6.9 mg/dL — ABNORMAL LOW (ref 8.9–10.3)
Chloride: 105 mmol/L (ref 98–111)
Creatinine, Ser: 0.81 mg/dL (ref 0.44–1.00)
GFR, Estimated: >60 mL/min (ref >60)
Glucose, Bld: 132 mg/dL — ABNORMAL HIGH (ref 70–99)
Potassium: 4.5 mmol/L (ref 3.5–5.1)
Sodium: 130 mmol/L — ABNORMAL LOW (ref 135–145)

## 2021-11-13 NOTE — Progress Notes (Addendum)
Palliative: [name] is resting quietly in bed.  She greets me, making and somewhat keeping eye contact.  She appears acutely ill and somewhat frail.  She is alert and oriented, able to make her basic needs known.  There is no family at bedside at this time.  We talk about her acute health concerns, the treatment plan.  We talked about the importance of continuing to increase her nutrition.  We talked again about swelling related to low albumin/nutrition.  We talk about short-term rehab for strength and recovery.  [name], Regina Knox, is assisting.  Transition of care team is working closely with family.  We talked about the benefits of outpatient palliative services for continued goals of care discussion, support.  I shared that this is usually associated with a hospice, but is not hospice care.  Provider choice discussed.  [name] is agreeable to outpatient palliative services with hospice of [facility] for continued support. Addendum: [name] shared that her husband (died 2 years ago approximately) had hospice care and passed at the hospice home in [place].  Conference with attending, bedside nursing staff, transition of care team related to patient condition, needs, goals of care, disposition.  Plan: Continue to treat the treatable, time for outcomes.  No further chemo/radiation treatments.  Goal is for surgical excision November/December.  Outpatient palliative services with hospice of [facility]  20 minutes Quinn Axe, NP Palliative medicine team Team phone [phone] Greater than 50% of this time was spent counseling and coordinating care related to the above assessment and plan.

## 2021-11-13 NOTE — Progress Notes (Signed)
Patient slept on and off during the night, awaking for medications. Assisted patient to reposition in the bed.

## 2021-11-13 NOTE — Progress Notes (Signed)
PROGRESS NOTE  Regina Knox  UJW:[id number] DOB: 1945-02-22 DOA: 10/30/2021 PCP: Chevis Pretty, FNP   Brief Narrative:  Patient is a 76 year old female with history of anemia, asthma, hyperlipidemia, GERD, hypertension, colorectal cancer who presented with intractable diarrhea secondary to recent radiation therapy, weakness.  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  Oncology, general surgery, palliative care following.  Diarrhea did not improve with loperamide.  Currently on octreotide.   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  No leukocytosis or fever.  No abdominal pain.  C. difficile, GI pathogen panel negative.  Vomiting has resolved.  IV fluids stopped due to volume overload.  Continue Lomotil, octreotide.  GI was consulted who recommended Lomotil and Questran.  Recent CT abdomen/pelvis showed circumferential wall thickening, mild inflammation of the rectosigmoid colon, air-fluid level throughout the remaining colon, multiple small bowel loops in the pelvis.  Findings consistent with enteritis.  Palliative care also consulted for goals of care. Currently on solid diet.  Had 2 bowel movements this morning.  Denies abdomen pain, nausea or vomiting  History of rectal cancer: Patient was on Xeloda and was receiving radiation treatment.  Patient is not interested on continuing chemotherapy  COPD: Currently stable.  Continue bronchodilators as needed  Hypokalemia: Supplemented and corrected  Hyponatremia: Sodium slowly going down.  We will continue monitor.  Non-anion gap metabolic disease: Likely due to loss of bicarb through diarrhea.   Continue sodium bicarb tablets  Hypertension: At home was on olmesartan, hydrochlorothiazide currently holding.  Blood pressure soft  Peripheral edema :  in the setting of IV fluids, hypoalbuminemia.  IV fluids discontinued.  Given a dose of Lasix.  Continue to monitor  Normocytic anemia: Currently hemoglobin stable  Leukocytosis: Resolved.  Cultures have been negative so far.  UA was not consistent with UTI.  Chest x-ray did not show pneumonia.  Weakness/debility: PT/OT saw her, recommended SNF on discharge  Goals of care: Palliative care also following.  Current plan is to continue full scope of treatment, full code  Nutrition Problem: Increased nutrient needs Etiology: acute illness, cancer and cancer related treatments, chronic illness Pressure Injury 11/12/21 Buttocks Right;Medial Stage 2 -  Partial thickness loss of dermis presenting as a shallow open injury with a red, pink wound bed without slough. 1cm x 1cm x 0cm (Active)  11/12/21 1000  Location: Buttocks  Location Orientation: Right;Medial  Staging: Stage 2 -  Partial thickness loss of dermis presenting as a shallow open injury with a red, pink wound bed without slough.  Wound Description (Comments): 1cm x 1cm x 0cm  Present on Admission: No  Dressing Type Foam - Lift dressing to assess site every shift 11/12/21 2110    DVT prophylaxis:Place TED hose Start: 11/06/21 1034 heparin injection 5,000 Units Start: 10/30/21 2215 SCDs Start: 10/30/21 2209     Code Status: Full Code  Family Communication: None at bedside  Patient status:Inpatient  Patient is from :Home  Anticipated discharge to:SNF  Estimated DC date: 1 to 2 days   Consultants: GI, general surgery, palliative care  Procedures: None  Antimicrobials:  Anti-infectives (From admission, onward)    None       Subjective: Patient seen and examined  at bedside today.  Hemodynamically stable.  Blood pressure better today.  Sinus tachycardia has  resolved.  Continues to remain weak, lying in bed.  Had 2 bowel movements today.  No nausea or vomiting.  Denies any abdominal pain.  Objective: Vitals:   11/12/21 0552 11/12/21 1205 11/12/21 2115 11/13/21 0538  BP: (!) 94/52 103/66 (!) 97/59 102/66  Pulse: 91 98 96 93  Resp: '16 16 19 18  '$ Temp: (!) 97.4 F (36.3 C) (!) 97.4 F (36.3 C) 98 F (36.7 C) 98.5 F (36.9 C)  TempSrc:  Oral    SpO2: 98% 97% 98% 98%  Weight:      Height:        Intake/Output Summary (Last 24 hours) at 11/13/2021 1047 Last data filed at 11/12/2021 1901 Gross per 24 hour  Intake 360 ml  Output --  Net 360 ml   Filed Weights   11/10/21 0516 11/11/21 0500 11/12/21 0446  Weight: 60.7 kg 60.3 kg 63.8 kg    Examination:  General exam: not in distress, very weak, deconditioned, chronically ill looking HEENT: PERRL Respiratory system:  no wheezes or crackles  Cardiovascular system: S1 & S2 heard, RRR.  Gastrointestinal system: Abdomen is nondistended, soft and nontender. Central nervous system: Alert and oriented Extremities: Trace bilateral lower extremity edema, no clubbing ,no cyanosis Skin: No rashes, no ulcers,no icterus       Data Reviewed: I have personally reviewed following labs and imaging studies  CBC: Recent Labs  Lab 11/08/21 0534 11/09/21 1610 11/10/21 0603 11/11/21 0350 11/12/21 0418 11/13/21 0320  WBC 6.8 7.8 14.3* 14.5* 13.8* 8.2  NEUTROABS 6.4 6.4 13.6* 13.5* 9.8*  --   HGB 11.8* 11.5* 11.8* 11.6* 11.6* 11.2*  HCT 34.5* 33.2* 35.1* 34.4* 34.6* 33.5*  MCV 93.2 92.0 93.4 95.3 93.5 94.6  PLT 246 222 239 208 213 960   Basic Metabolic Panel: Recent Labs  Lab 11/08/21 0534 11/09/21 0638 11/10/21 0603 11/11/21 0350 11/12/21 0418 11/13/21 0320  NA 131* 133* 134* 134* 132* 130*  K 4.3 3.6 4.1 4.7 4.6 4.5  CL 100 100 101 103 103 105  CO2 '22 25 23 '$ 19* 18* 20*  GLUCOSE 99 96 95 78 116* 132*  BUN '20 22 23 '$ 24* 24* 23  CREATININE 0.74 0.74 0.72 0.76 0.84 0.81  CALCIUM 7.3*  7.1* 7.1* 6.8* 7.1* 6.9*  MG 1.9 2.0 2.1 2.2 2.2  --   PHOS 2.8 3.2 2.8 2.7 3.1  --      Recent Results (from the past 240 hour(s))  Culture, blood (Routine X 2) w Reflex to ID Panel     Status: None (Preliminary result)   Collection Time: 11/10/21  9:35 AM   Specimen: BLOOD  Result Value Ref Range Status   Specimen Description BLOOD RIGHT ASSIST CONTROL  Final   Special Requests   Final    BOTTLES DRAWN AEROBIC AND ANAEROBIC Blood Culture adequate volume   Culture   Final    NO GROWTH 3 DAYS Performed at [facility], [address], [place], [place] [address]    Report Status PENDING  Incomplete  Culture, blood (Routine X 2) w Reflex to ID Panel     Status: None (Preliminary result)   Collection Time: 11/10/21  9:35 AM   Specimen: BLOOD  Result Value Ref Range Status   Specimen Description BLOOD RIGHT HAND  Final   Special Requests   Final    BOTTLES DRAWN AEROBIC AND ANAEROBIC Blood Culture adequate volume  Culture   Final    NO GROWTH 3 DAYS Performed at [facility], [address] [place], [place] [address]    Report Status PENDING  Incomplete  Urine Culture     Status: Abnormal (Preliminary result)   Collection Time: 11/10/21  9:40 AM   Specimen: Urine, Clean Catch  Result Value Ref Range Status   Specimen Description   Final    URINE, CLEAN CATCH Performed at [facility], [address], [place], [place] [address]    Special Requests   Final    NONE Performed at [facility], [address], [place], [place] [address]    Culture (A)  Final    20,000 COLONIES/mL ESCHERICHIA COLI 30,000 COLONIES/mL ENTEROCOCCUS FAECALIS CULTURE REINCUBATED FOR BETTER GROWTH Performed at [facility], [place] [address], [place], [place] [address]    Report Status PENDING  Incomplete   Organism ID, Bacteria ESCHERICHIA COLI (A)  Final      Susceptibility   Escherichia coli - MIC*    AMPICILLIN <=2 SENSITIVE Sensitive     CEFAZOLIN <=4 SENSITIVE Sensitive     CEFEPIME  <=0.12 SENSITIVE Sensitive     CEFTRIAXONE <=0.25 SENSITIVE Sensitive     CIPROFLOXACIN <=0.25 SENSITIVE Sensitive     GENTAMICIN <=1 SENSITIVE Sensitive     IMIPENEM <=0.25 SENSITIVE Sensitive     NITROFURANTOIN <=16 SENSITIVE Sensitive     TRIMETH/SULFA <=20 SENSITIVE Sensitive     AMPICILLIN/SULBACTAM <=2 SENSITIVE Sensitive     PIP/TAZO <=4 SENSITIVE Sensitive     * 20,000 COLONIES/mL ESCHERICHIA COLI  C Difficile Quick Screen (NO PCR Reflex)     Status: None   Collection Time: 11/12/21 11:00 AM   Specimen: STOOL  Result Value Ref Range Status   C Diff antigen NEGATIVE NEGATIVE Final   C Diff toxin NEGATIVE NEGATIVE Final   C Diff interpretation No C. difficile detected.  Final    Comment: Performed at [facility], [address], [place], [place] [address]     Radiology Studies: No results found.  Scheduled Meds:  cholestyramine  4 g Oral Daily   diphenoxylate-atropine  2 tablet Oral Q6H   feeding supplement (KATE FARMS STANDARD 1.4)  325 mL Oral Daily   Fluticasone Furoate  1 Inhalation Inhalation Daily   heparin  5,000 Units Subcutaneous Q8H   lactose free nutrition  237 mL Oral TID WC   multivitamin with minerals  1 tablet Oral Daily   octreotide  150 mcg Subcutaneous TID   pantoprazole  40 mg Oral Daily   sodium bicarbonate  650 mg Oral BID   Continuous Infusions:   LOS: 14 days   Shelly Coss, MD [facility] P9/28/2023, 10:47 AM

## 2021-11-13 NOTE — Progress Notes (Signed)
Gastroenterology Progress Note   Referring Provider: No ref. provider found Primary Care Physician:  Chevis Pretty, [place] Primary Gastroenterologist:  Dr. Thornton Park  Patient ID: Regina Knox; [id number]; 05/31/1945   Subjective:    Has had about 4 stools in the last 24 hours. Feels weak. Eating a few bites each meal and trying to drink protein supplements (clear liquid based). She does not like magic cups, ice cream is too thick. She tolerates fruit cups the best. She is still having some llq tenderness/cramps.   Objective:   Vital signs in last 24 hours: Temp:  [97.4 F (36.3 C)-98.5 F (36.9 C)] 98.5 F (36.9 C) (09/28 0538) Pulse Rate:  [93-98] 93 (09/28 0538) Resp:  [16-19] 18 (09/28 0538) BP: (97-103)/(59-66) 102/66 (09/28 0538) SpO2:  [97 %-98 %] 98 % (09/28 0538) Last BM Date : 11/12/21 General:   Alert,  frail appearing, pleasant and cooperative in NAD Head:  Normocephalic and atraumatic. Eyes:  Sclera clear, no icterus.  Abdomen:  Soft,  nondistended.   Normal bowel sounds, without guarding, and without rebound.   Extremities:  Without clubbing, deformity or edema. Neurologic:  Alert and  oriented x4;  grossly normal neurologically. Skin:  Intact without significant lesions or rashes. Psych:  Alert and cooperative. Normal mood and affect.  Intake/Output from previous day: 09/27 0701 - 09/28 0700 In: 580 [P.O.:580] Out: -  Intake/Output this shift: No intake/output data recorded.  Lab Results: CBC Recent Labs    11/11/21 0350 11/12/21 0418 11/13/21 0320  WBC 14.5* 13.8* 8.2  HGB 11.6* 11.6* 11.2*  HCT 34.4* 34.6* 33.5*  MCV 95.3 93.5 94.6  PLT 208 213 189   BMET Recent Labs    11/11/21 0350 11/12/21 0418 11/13/21 0320  NA 134* 132* 130*  K 4.7 4.6 4.5  CL 103 103 105  CO2 19* 18* 20*  GLUCOSE 78 116* 132*  BUN 24* 24* 23  CREATININE 0.76 0.84 0.81  CALCIUM 6.8* 7.1* 6.9*   LFTs Recent Labs    11/11/21 0350 11/12/21 0418   BILITOT 0.7 1.1  ALKPHOS 71 84  AST 18 17  ALT 18 18  PROT 3.6* 3.9*  ALBUMIN 1.5* 1.6*   No results for input(s): "LIPASE" in the last 72 hours. PT/INR No results for input(s): "LABPROT", "INR" in the last 72 hours.       Imaging Studies: CT ABDOMEN PELVIS W CONTRAST  Result Date: 11/10/2021 CLINICAL DATA:  Abdominal pain. Patient is currently on chemo and radiation for rectal cancer. EXAM: CT ABDOMEN AND PELVIS WITH CONTRAST TECHNIQUE: Multidetector CT imaging of the abdomen and pelvis was performed using the standard protocol following bolus administration of intravenous contrast. RADIATION DOSE REDUCTION: This exam was performed according to the departmental dose-optimization program which includes automated exposure control, adjustment of the mA and/or kV according to patient size and/or use of iterative reconstruction technique. CONTRAST:  60m OMNIPAQUE IOHEXOL 300 MG/ML  SOLN COMPARISON:  CT chest abdomen and pelvis 08/21/2021. MRI abdomen 09/29/2021. FINDINGS: Lower chest: There is a new small right pleural effusion. Hepatobiliary: Again seen is a calcified granuloma in the liver. No new hepatic lesions are seen. Gallbladder and bile ducts are within normal limits. Pancreas: Unremarkable. No pancreatic ductal dilatation or surrounding inflammatory changes. Spleen: Normal in size without focal abnormality. Adrenals/Urinary Tract: Adrenal glands are unremarkable. Kidneys are normal, without renal calculi, focal lesion, or hydronephrosis. Bladder is unremarkable. Stomach/Bowel: There is circumferential wall thickening and mild inflammation of the rectosigmoid colon.  Air-fluid levels are seen throughout the remaining colon. Multiple small bowel loops in the pelvis and the terminal ileum demonstrate circumferential wall thickening with associated mesenteric edema and mild surrounding inflammation compatible with enteritis. There is upstream dilatation of small bowel with air-fluid levels  measuring up to 3.2 cm. There is a large hiatal hernia. The stomach is nondilated. No evidence for pneumatosis, portal venous gas, or free air. Appendix is not seen. Vascular/Lymphatic: Aorta and IVC are normal in size. There are prominent perirectal lymph nodes present. These have decreased in size when compared to the prior study. No new enlarged lymph nodes are seen. Reproductive: Uterus and bilateral adnexa are unremarkable. Other: There is new small amount of free fluid throughout the abdomen and pelvis. There is new body wall edema. No focal abdominal wall hernia. Musculoskeletal: No acute or significant osseous findings. IMPRESSION: 1. There is wall thickening and inflammation of ileal loops and terminal ileum compatible with nonspecific enteritis. There is resulted upstream small bowel obstruction. 2. Rectosigmoid colon wall thickening compatible with proctocolitis. 3. Perirectal lymphadenopathy has mildly decreased. 4. New small volume ascites and anasarca. 5. New small right pleural effusion. Electronically Signed   By: Ronney Asters M.D.   On: 11/10/2021 18:31   DG Abd 2 Views  Result Date: 11/10/2021 CLINICAL DATA:  Diarrhea, abdominal pain EXAM: ABDOMEN - 2 VIEW COMPARISON:  Chest radiographs done earlier today FINDINGS: There is abnormal dilation of multiple small bowel loops in upper abdomen measuring up to 4.3 cm in diameter. Stomach is not distended. Colon is not distended. There is no pneumoperitoneum. There is 3 mm calcific density overlying the right kidney, possibly renal stone. Right pleural effusion is seen. IMPRESSION: There is abnormal dilation of small-bowel loops suggesting possible partial small bowel obstruction. If clinically warranted, follow-up CT may be considered. Possible small right renal stone. Electronically Signed   By: Elmer Picker M.D.   On: 11/10/2021 13:27   DG CHEST PORT 1 VIEW  Result Date: 11/10/2021 CLINICAL DATA:  Leukocytosis EXAM: PORTABLE CHEST 1 VIEW  COMPARISON:  CT 08/21/2021 FINDINGS: The cardiomediastinal silhouette is within normal limits. There is a small right pleural effusion and adjacent right basilar opacities. Skin folds overlie the chest bilaterally. No evidence of pneumothorax. Dilated loops of small bowel in the left upper abdomen. IMPRESSION: Small right pleural effusion with adjacent right basilar opacities, could be atelectasis or infection. Dilated loops of small bowel in the upper left abdomen, correlate with exam and consider abdominal radiographs. Electronically Signed   By: Maurine Simmering M.D.   On: 11/10/2021 10:06   CT ABDOMEN PELVIS WO CONTRAST  Result Date: 11/04/2021 Shirline Frees     11/04/2021  5:42 PM Pt refused CT Abe/Pel scan  [2 weeks]  Assessment:   76 year old lady with a history of C diff in January 2023, persistent mild diarrhea thereafter and found to have 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  GI consulted for intractable diarrhea.    Acute on chronic diarrhea: Worsened in the setting of chemoradiation treatments, last treatment September 13.  GIP and C. difficile negative at beginning of her admission.  Initially patient declined CT but she agreed to have it done 11/10/21 and CT abdomen/pelvis with IV contrast only with rectosigmoid colon wall thickening compatible with proctocolitis, multiple small bowel loops in the pelvis and TI with circumferential wall thickening with associated mesenteric edema,  surrounding inflammation compatible with enteritis with upstream small bowel dilation with air-fluid levels measuring 3.2 cm.  Mild decrease in perirectal lymphadenopathy.  Clinically not presenting as a bowel obstruction.  Etiology of diarrhea, ileitis, proctocolitis suspect to be secondary to chemo/radiation therapy. Due to increase in WBC, repeat C. difficile  ordered and was negative. WBC normal today. CMV DNA pending.  Oncology started octreotide 3 times daily in addition to Lomotil and Questran. Stool frequency improved. Appetite remains poor.    Plan:   Encouraged her to try and increase oral intake. Few bites at a time, several times throughout the day. Avoiding milk and ice cream, high fiber foods which will likely exacerbate her symptoms. Otherwise, try to eat what looks appealing to her.  Continue lomotil, questran, octreotide.  F/u CMV DNA.   LOS: 14 days   Laureen Ochs. Bernarda Caffey [facility] [phone] 9/28/202310:19 AM

## 2021-11-14 DIAGNOSIS — C189 Malignant neoplasm of colon, unspecified: Secondary | ICD-10-CM | POA: Diagnosis not present

## 2021-11-14 DIAGNOSIS — K6289 Other specified diseases of anus and rectum: Secondary | ICD-10-CM | POA: Diagnosis not present

## 2021-11-14 DIAGNOSIS — K529 Noninfective gastroenteritis and colitis, unspecified: Secondary | ICD-10-CM | POA: Diagnosis not present

## 2021-11-14 DIAGNOSIS — R197 Diarrhea, unspecified: Secondary | ICD-10-CM | POA: Diagnosis not present

## 2021-11-14 DIAGNOSIS — E440 Moderate protein-calorie malnutrition: Secondary | ICD-10-CM | POA: Diagnosis not present

## 2021-11-14 LAB — URINE CULTURE: Culture: 20000 — AB

## 2021-11-14 LAB — BASIC METABOLIC PANEL
Anion gap: 7 (ref 5–15)
BUN: 19 mg/dL (ref 8–23)
CO2: 21 mmol/L — ABNORMAL LOW (ref 22–32)
Calcium: 6.8 mg/dL — ABNORMAL LOW (ref 8.9–10.3)
Chloride: 104 mmol/L (ref 98–111)
Creatinine, Ser: 0.75 mg/dL (ref 0.44–1.00)
GFR, Estimated: >60 mL/min (ref >60)
Glucose, Bld: 107 mg/dL — ABNORMAL HIGH (ref 70–99)
Potassium: 4.4 mmol/L (ref 3.5–5.1)
Sodium: 132 mmol/L — ABNORMAL LOW (ref 135–145)

## 2021-11-14 LAB — CMV DNA, QUANTITATIVE, PCR
CMV DNA Quant: POSITIVE IU/mL
Log10 CMV Qn DNA Pl: UNDETERMINED log10 IU/mL

## 2021-11-14 MED ORDER — DICYCLOMINE HCL 10 MG PO CAPS
10.0000 mg | ORAL_CAPSULE | Freq: Three times a day (TID) | ORAL | Status: DC
  Administered 2021-11-14 – 2021-11-22 (×20): 10 mg via ORAL
  Filled 2021-11-14 (×23): qty 1

## 2021-11-14 MED ORDER — BOOST / RESOURCE BREEZE PO LIQD CUSTOM
1.0000 | Freq: Three times a day (TID) | ORAL | Status: DC
  Administered 2021-11-14 – 2021-11-23 (×14): 1 via ORAL

## 2021-11-14 MED ORDER — ZINC SULFATE 220 (50 ZN) MG PO CAPS
220.0000 mg | ORAL_CAPSULE | Freq: Every day | ORAL | Status: DC
  Administered 2021-11-14 – 2021-11-20 (×7): 220 mg via ORAL
  Filled 2021-11-14 (×8): qty 1

## 2021-11-14 MED ORDER — DICYCLOMINE HCL 20 MG PO TABS: 20.0000 mg | ORAL_TABLET | Freq: Three times a day (TID) | ORAL | Status: DC | PRN

## 2021-11-14 MED ORDER — VITAMIN C 500 MG PO TABS
500.0000 mg | ORAL_TABLET | Freq: Two times a day (BID) | ORAL | Status: DC
  Administered 2021-11-14 – 2021-11-20 (×13): 500 mg via ORAL
  Filled 2021-11-14 (×14): qty 1

## 2021-11-14 MED ORDER — OCTREOTIDE ACETATE 100 MCG/ML IJ SOLN
200.0000 ug | Freq: Three times a day (TID) | INTRAMUSCULAR | Status: DC
  Administered 2021-11-14 – 2021-11-20 (×20): 200 ug via SUBCUTANEOUS
  Filled 2021-11-14 (×30): qty 2

## 2021-11-14 NOTE — Care Management Important Message (Signed)
Important Message  Patient Details  Name: Regina Knox MRN: [id number] Date of Birth: Feb 14, 1946   Medicare Important Message Given:  Yes (followed up with son Jayline Kilburg at [phone], no additional copy needed)     Tommy Medal 11/14/2021, 1:02 PM

## 2021-11-14 NOTE — Progress Notes (Signed)
PROGRESS NOTE  Regina Knox  IWO:[id number] DOB: 04-22-1945 DOA: 10/30/2021 PCP: Chevis Pretty, FNP   Brief Narrative:  Patient is a 76 year old female with history of anemia, asthma, hyperlipidemia, GERD, hypertension, colorectal cancer who presented with intractable diarrhea secondary to recent radiation therapy, weakness.  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  Oncology, general surgery, palliative care following.  Hospital course remarkable for persistent diarrhea, weakness.  Overall condition slowly improving, diuretics resumed.  Still has poor oral intake.  On Lomotil, Questran, subcutaneous octreotide.  Ultimate plan is to discharge her to skilled nursing  facility.  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  No leukocytosis or fever.  No abdominal pain.  C. difficile, GI pathogen panel negative.  Vomiting has resolved.  IV fluids stopped due to volume overload.  Continue Lomotil, questran,octreotide.  GI following.Recent CT abdomen/pelvis showed circumferential wall thickening, mild inflammation of the rectosigmoid colon, air-fluid level throughout the remaining colon, multiple small bowel loops in the pelvis.  Findings consistent with enteritis.  Palliative care also consulted for goals of care. Currently on solid diet.  Had 2 loose bowel movements last night.  Denies abdomen pain, nausea or vomiting.  Continues to have poor oral intake.  History of rectal cancer: Patient was on Xeloda and was receiving radiation treatment.  Patient is not interested on continuing chemotherapy.  Patient following with surgery to  discuss about surgical options  COPD: Currently stable.  Continue bronchodilators as needed  Hypokalemia: Supplemented and corrected  Hyponatremia: Sodium improved.  We will continue monitor.  Non-anion gap metabolic disease: Likely due to loss of bicarb through diarrhea.  Continue sodium bicarb tablets  Hypertension: At home was on olmesartan, hydrochlorothiazide currently holding.  Blood pressure soft  Peripheral edema :  in the setting of IV fluids, hypoalbuminemia.  IV fluids discontinued.   Normocytic anemia: Currently hemoglobin stable  Leukocytosis: Resolved.  Cultures have been negative so far.  UA was not consistent with UTI.  Chest x-ray did not show pneumonia.  Weakness/debility: PT/OT saw her, recommended SNF on discharge  Goals of care: Palliative care also following.  Current plan is to continue full scope of treatment, full code, outpatient follow-up with palliative services with hosp[facility].  Nutrition Problem: Increased nutrient needs Etiology: acute illness, cancer and cancer related treatments, chronic illness Pressure Injury 11/12/21 Buttocks Right;Medial Stage 2 -  Partial thickness loss of dermis presenting as a shallow open injury with a red, pink wound bed without slough. 1cm x 1cm x 0cm (Active)  11/12/21 1000  Location: Buttocks  Location Orientation: Right;Medial  Staging: Stage 2 -  Partial thickness loss of dermis presenting as a shallow open injury with a red, pink wound bed without slough.  Wound Description (Comments): 1cm x 1cm x 0cm  Present on Admission: No  Dressing Type Foam - Lift dressing to assess site every shift 11/13/21 2115    DVT prophylaxis:Place TED hose Start: 11/06/21 1034 heparin injection 5,000 Units Start: 10/30/21 2215 SCDs Start: 10/30/21 2209     Code Status: Full Code  Family Communication: Called son on phone today but call not received  Patient status:Inpatient  Patient  is from :Home  Anticipated  discharge to:SNF  Estimated DC date: 2-3 days   Consultants: GI, general surgery, palliative care  Procedures: None  Antimicrobials:  Anti-infectives (From admission, onward)    None       Subjective: Patient seen and examined at bedside today.  Hemodynamically stable.  Had 2 loose bowel movements last night.  Denies any abdomen pain nausea or vomiting.  Continues to have poor oral intake and remains weak.  No new complaints  Objective: Vitals:   11/13/21 1406 11/13/21 2135 11/14/21 0500 11/14/21 0527  BP: 123/73 (!) 109/57  122/85  Pulse: 97 98  (!) 40  Resp: 16 16    Temp: (!) 97.5 F (36.4 C) 97.6 F (36.4 C)  (!) 97.5 F (36.4 C)  TempSrc: Oral Oral  Oral  SpO2: 98% 97%  98%  Weight:   62.4 kg   Height:        Intake/Output Summary (Last 24 hours) at 11/14/2021 1128 Last data filed at 11/14/2021 0500 Gross per 24 hour  Intake 540 ml  Output --  Net 540 ml   Filed Weights   11/11/21 0500 11/12/21 0446 11/14/21 0500  Weight: 60.3 kg 63.8 kg 62.4 kg    Examination:  General exam: Not in distress, very deconditioned, weak, chronically looking HEENT: PERRL Respiratory system:  no wheezes or crackles  Cardiovascular system: S1 & S2 heard, RRR.  Gastrointestinal system: Abdomen is mildly distended, soft and nontender.BS present Central nervous system: Alert and oriented Extremities: trace lower  edema, no clubbing ,no cyanosis Skin: No rashes, no ulcers,no icterus     Data Reviewed: I have personally reviewed following labs and imaging studies  CBC: Recent Labs  Lab 11/08/21 0534 11/09/21 4268 11/10/21 0603 11/11/21 0350 11/12/21 0418 11/13/21 0320  WBC 6.8 7.8 14.3* 14.5* 13.8* 8.2  NEUTROABS 6.4 6.4 13.6* 13.5* 9.8*  --   HGB 11.8* 11.5* 11.8* 11.6* 11.6* 11.2*  HCT 34.5* 33.2* 35.1* 34.4* 34.6* 33.5*  MCV 93.2 92.0 93.4 95.3 93.5 94.6  PLT 246 222 239 208 213 341   Basic Metabolic Panel: Recent Labs  Lab 11/08/21 0534 11/09/21 0638  11/10/21 0603 11/11/21 0350 11/12/21 0418 11/13/21 0320 11/14/21 0330  NA 131* 133* 134* 134* 132* 130* 132*  K 4.3 3.6 4.1 4.7 4.6 4.5 4.4  CL 100 100 101 103 103 105 104  CO2 '22 25 23 '$ 19* 18* 20* 21*  GLUCOSE 99 96 95 78 116* 132* 107*  BUN '20 22 23 '$ 24* 24* 23 19  CREATININE 0.74 0.74 0.72 0.76 0.84 0.81 0.75  CALCIUM 7.3* 7.1* 7.1* 6.8* 7.1* 6.9* 6.8*  MG 1.9 2.0 2.1 2.2 2.2  --   --   PHOS 2.8 3.2 2.8 2.7 3.1  --   --      Recent Results (from the past 240 hour(s))  Culture, blood (Routine X 2) w Reflex to ID Panel     Status: None (Preliminary result)   Collection Time: 11/10/21  9:35 AM   Specimen: BLOOD  Result Value Ref Range Status   Specimen Description BLOOD RIGHT ASSIST CONTROL  Final   Special Requests   Final    BOTTLES DRAWN AEROBIC AND ANAEROBIC Blood Culture adequate volume   Culture   Final    NO GROWTH 4 DAYS Performed at [facility], [address], [place], [place] [address]    Report Status PENDING  Incomplete  Culture, blood (Routine X 2) w Reflex to ID Panel  Status: None (Preliminary result)   Collection Time: 11/10/21  9:35 AM   Specimen: BLOOD  Result Value Ref Range Status   Specimen Description BLOOD RIGHT HAND  Final   Special Requests   Final    BOTTLES DRAWN AEROBIC AND ANAEROBIC Blood Culture adequate volume   Culture   Final    NO GROWTH 4 DAYS Performed at [facility], [address], [place], [place] [address]    Report Status PENDING  Incomplete  Urine Culture     Status: Abnormal   Collection Time: 11/10/21  9:40 AM   Specimen: Urine, Clean Catch  Result Value Ref Range Status   Specimen Description   Final    URINE, CLEAN CATCH Performed at [facility], [address] [place], [place] [address]    Special Requests   Final    NONE Performed at [facility], [address] [place], [place] [address]    Culture (A)  Final    20,000 COLONIES/mL ESCHERICHIA COLI 30,000 COLONIES/mL ENTEROCOCCUS FAECALIS    Report Status  11/14/2021 FINAL  Final   Organism ID, Bacteria ESCHERICHIA COLI (A)  Final   Organism ID, Bacteria ENTEROCOCCUS FAECALIS (A)  Final      Susceptibility   Escherichia coli - MIC*    AMPICILLIN <=2 SENSITIVE Sensitive     CEFAZOLIN <=4 SENSITIVE Sensitive     CEFEPIME <=0.12 SENSITIVE Sensitive     CEFTRIAXONE <=0.25 SENSITIVE Sensitive     CIPROFLOXACIN <=0.25 SENSITIVE Sensitive     GENTAMICIN <=1 SENSITIVE Sensitive     IMIPENEM <=0.25 SENSITIVE Sensitive     NITROFURANTOIN <=16 SENSITIVE Sensitive     TRIMETH/SULFA <=20 SENSITIVE Sensitive     AMPICILLIN/SULBACTAM <=2 SENSITIVE Sensitive     PIP/TAZO <=4 SENSITIVE Sensitive     * 20,000 COLONIES/mL ESCHERICHIA COLI   Enterococcus faecalis - MIC*    AMPICILLIN <=2 SENSITIVE Sensitive     NITROFURANTOIN <=16 SENSITIVE Sensitive     VANCOMYCIN 1 SENSITIVE Sensitive     * 30,000 COLONIES/mL ENTEROCOCCUS FAECALIS  C Difficile Quick Screen (NO PCR Reflex)     Status: None   Collection Time: 11/12/21 11:00 AM   Specimen: STOOL  Result Value Ref Range Status   C Diff antigen NEGATIVE NEGATIVE Final   C Diff toxin NEGATIVE NEGATIVE Final   C Diff interpretation No C. difficile detected.  Final    Comment: Performed at [facility], [address], [place], [place] [address]     Radiology Studies: No results found.  Scheduled Meds:  cholestyramine  4 g Oral Daily   diphenoxylate-atropine  2 tablet Oral Q6H   feeding supplement (KATE FARMS STANDARD 1.4)  325 mL Oral Daily   Fluticasone Furoate  1 Inhalation Inhalation Daily   heparin  5,000 Units Subcutaneous Q8H   lactose free nutrition  237 mL Oral TID WC   multivitamin with minerals  1 tablet Oral Daily   octreotide  150 mcg Subcutaneous TID   pantoprazole  40 mg Oral Daily   sodium bicarbonate  650 mg Oral BID   Continuous Infusions:   LOS: 15 days   Shelly Coss, MD [facility] P9/29/2023, 11:28 AM

## 2021-11-14 NOTE — Progress Notes (Addendum)
Nutrition Follow-up  DOCUMENTATION CODES:   Not applicable  INTERVENTION:   -D/c Boost plus -D/c Dillard [place] -Boost Breeze po TID, each supplement provides 250 kcal and 9 grams of protein  -Continue regular diet for widest variety of meal selections -Encourage adequate oral intake; advised family to being in outside food that pt enjoys -MVI with minerals daily -500 mg vitamin C daily -220 mg zinc sulfate daily x 14 days  NUTRITION DIAGNOSIS:   Increased nutrient needs related to acute illness, cancer and cancer related treatments, chronic illness as evidenced by estimated needs.  Ongoing  GOAL:   Patient will meet greater than or equal to 90% of their needs  Progressing   MONITOR:   PO intake, Supplement acceptance, Diet advancement, Labs, Weight trends, I & O's  REASON FOR ASSESSMENT:   Consult Assessment of nutrition requirement/status  ASSESSMENT:   76 y.o. female with medical history of anemia, asthma, HLD, GERD, HTN, colorectal cancer undergoing radiation with concurrent xeloda. She presented to the ED due to intractable diarrhea thought to be 2/2 recent radiation.  Reviewed I/O's: +540 ml x 24 hours and +11.6 L since admission   Per MD notes, pt does not want to pursue chemotherapy and plans to discuss options with general surgery secondary to rectal cancer. Plan for outpatient palliative services per PCT notes.   Spoke with pt son, who reports that pt still has a very poor appetite. She usually consumes a few bites at each meal. Noted meal completions 0-20%. Son reports that GI told pt to avoid dairy products and the Boost Plus and Dillard Essex do not agree with her and exacerbate her diarrhea. Pt likes Boost Breeze and her son has been bringing in supplements at home. Offered to order [employer] here; pt is agreeable.   Discussed importance of good meal and supplement intake to promote healing. Pt on a regular diet, which will provide the widest variety of meal  selections. Son has also bee bringing in outside food, such as fruit cups, which pt enjoys. Encouraged pt son to bring in outside food to help improve oral intake.   Per TOC notes, plan to discharge to SNF early next week.   Medications reviewed and include lomotil.   Labs reviewed: Na: 132, CBGS: 98 (inpatient orders for glycemic control are none).    Diet Order:   Diet Order             Diet regular Room service appropriate? Yes; Fluid consistency: Thin  Diet effective now                   EDUCATION NEEDS:   Education needs have been addressed  Skin:  Skin Assessment: Skin Integrity Issues: Skin Integrity Issues:: Stage II Stage II: rt buttocks  Last BM:  11/14/21 (type 7)  Height:   Ht Readings from Last 1 Encounters:  10/30/21 '5\' 3"'$  (1.6 m)    Weight:   Wt Readings from Last 1 Encounters:  11/14/21 62.4 kg    Ideal Body Weight:  52.3 kg  BMI:  Body mass index is 24.37 kg/m.  Estimated Nutritional Needs:   Kcal:  1650-1850 kcal  Protein:  80-95 grams  Fluid:  >/= 1.8 L/day    Loistine Chance, RD, LDN, [place] Registered Dietitian II Certified Diabetes Care and Education Specialist Please refer to [facility] for RD and/or RD on-call/weekend/after hours pager

## 2021-11-14 NOTE — Consult Note (Signed)
[facility] Progress Note  Name: Regina Knox      MRN: [id number]    Location: A305/A305-01  Date: 11/14/2021 Time:3:30 PM   Subjective: Interval History:Regina Knox is seen on rounds today.  She is having 4-5 diarrhea movements per day.  She still is not able to eat much because of the fear of having diarrhea after eating.  She is weak and is mostly lying in bed.  She reports cramping in her abdomen prior to having a bowel movement.  She is tolerating octreotide very well.  Objective: Vital signs in last 24 hours: Temp:  [97.5 F (36.4 C)-97.6 F (36.4 C)] 97.5 F (36.4 C) (09/29 0527) Pulse Rate:  [40-98] 40 (09/29 0527) Resp:  [16] 16 (09/28 2135) BP: (109-122)/(57-85) 122/85 (09/29 0527) SpO2:  [97 %-98 %] 98 % (09/29 0527) Weight:  [137 lb 9.1 oz (62.4 kg)] 137 lb 9.1 oz (62.4 kg) (09/29 0500)    Intake/Output from previous day: 09/28 0701 - 09/29 0700 In: 540 [P.O.:540] Out: -     Intake/Output this shift: Total I/O In: 240 [P.O.:240] Out: -    PHYSICAL EXAM: BP 122/85   Pulse (!) 40   Temp (!) 97.5 F (36.4 C) (Oral)   Resp 16   Ht _0  (1.6 m)   Wt 137 lb 9.1 oz (62.4 kg)   LMP 05/25/1996   SpO2 98%   BMI 24.37 kg/m  General appearance: alert, cooperative, and appears stated age Lungs: clear to auscultation bilaterally Abdomen:  Soft, vague generalized tenderness.  No masses. Extremities:  No edema or cyanosis.   Studies/Results: Results for orders placed or performed during the hospital encounter of 10/30/21 (from the past 48 hour(s))  CBC     Status: Abnormal   Collection Time: 11/13/21  3:20 AM  Result Value Ref Range   WBC 8.2 4.0 - 10.5 K/uL   RBC 3.54 (L) 3.87 - 5.11 MIL/uL   Hemoglobin 11.2 (L) 12.0 - 15.0 g/dL   HCT 33.5 (L) 36.0 - 46.0 %   MCV 94.6 80.0 - 100.0 fL   MCH 31.6 26.0 - 34.0 pg   MCHC 33.4 30.0 - 36.0 g/dL   RDW 24.4 (H) 11.5 - 15.5 %   Platelets 189 150 - 400 K/uL   nRBC 0.0 0.0 - 0.2 %    Comment:  Performed at [facility], [address] [place], [place] [address]  Basic metabolic panel     Status: Abnormal   Collection Time: 11/13/21  3:20 AM  Result Value Ref Range   Sodium 130 (L) 135 - 145 mmol/L   Potassium 4.5 3.5 - 5.1 mmol/L   Chloride 105 98 - 111 mmol/L   CO2 20 (L) 22 - 32 mmol/L   Glucose, Bld 132 (H) 70 - 99 mg/dL    Comment: Glucose reference range applies only to samples taken after fasting for at least 8 hours.   BUN 23 8 - 23 mg/dL   Creatinine, Ser 0.81 0.44 - 1.00 mg/dL   Calcium 6.9 (L) 8.9 - 10.3 mg/dL   GFR, Estimated >60 >60 mL/min    Comment: (NOTE) Calculated using the CKD-EPI Creatinine Equation (2021)    Anion gap 5 5 - 15    Comment: Performed at [facility], [address], [place], [place] [address]  Basic metabolic panel     Status: Abnormal   Collection Time: 11/14/21  3:30 AM  Result Value Ref Range   Sodium 132 (  L) 135 - 145 mmol/L   Potassium 4.4 3.5 - 5.1 mmol/L   Chloride 104 98 - 111 mmol/L   CO2 21 (L) 22 - 32 mmol/L   Glucose, Bld 107 (H) 70 - 99 mg/dL    Comment: Glucose reference range applies only to samples taken after fasting for at least 8 hours.   BUN 19 8 - 23 mg/dL   Creatinine, Ser 0.75 0.44 - 1.00 mg/dL   Calcium 6.8 (L) 8.9 - 10.3 mg/dL   GFR, Estimated >60 >60 mL/min    Comment: (NOTE) Calculated using the CKD-EPI Creatinine Equation (2021)    Anion gap 7 5 - 15    Comment: Performed at [facility], [address], [place], [place] [address]   No results found.   MEDICATIONS: I have reviewed the patient's current medications.     Assessment 1.  Stage III (T3N2) mid to high rectal cancer, MMR preserved: - Chemoradiation therapy with Xeloda started on 10/02/2021 - Last radiation and Xeloda on 10/30/2021   2.  Social/family history: - She lived by herself at home.  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  She was self employed prior to that.  Non-smoker.  Sister had metastatic  breast cancer.  Paternal aunt had cancer.   Plan:  1.  Intractable diarrhea: - She is receiving octreotide 150 mcg 3 times daily. - She is having 4-5 watery bowel movements per day. - We will titrate the octreotide to 200 mcg 3 times daily. - We will add Bentyl 20 mg 3 times daily for cramping pain prior to diarrhea. - Please discontinue octreotide within 24 hours of resolution of diarrhea. - She is being planned for placement at a rehab center.  2.  Stage III rectal cancer: - Treatment on hold since 10/31/2021 due to admission for intractable diarrhea. - She does not want to pursue any further treatment for rectal cancer.  3.  Protein malnutrition: - Severe malnutrition from decreased p.o. intake.  Albumin is severely low. - She will increase protein intake once diarrhea controlled.    All questions were answered. The patient knows to call the clinic with any problems, questions or concerns. We can certainly see the patient much sooner if necessary.     Derek Jack

## 2021-11-14 NOTE — Progress Notes (Signed)
Gastroenterology Progress Note   Referring Provider: No ref. provider found Primary Care Physician:  Chevis Pretty, [place] Primary Gastroenterologist:  Dr. Thornton Park  Patient ID: Regina Knox; [id number]; [birth year]    Subjective   Poor appetite remains. She has only had about 3-4 bowel movements in the last 24 hours. She reports ongoing cramping prior to bowel movements with urgency and unable to make it to the bedside commode. Not having any abdominal pain otherwise. Reports she has been trying to drink clear boosts and water to stay hydrated. States "I just want to feel better so I can go home". She feels as though the Lucrezia Starch is causing her abdominal crmaping prior to bowel movements.    Objective   Vital signs in last 24 hours Temp:  [97.5 F (36.4 C)-97.6 F (36.4 C)] 97.5 F (36.4 C) (09/29 0527) Pulse Rate:  [40-98] 40 (09/29 0527) Resp:  [16] 16 (09/28 2135) BP: (109-123)/(57-85) 122/85 (09/29 0527) SpO2:  [97 %-98 %] 98 % (09/29 0527) Weight:  [62.4 kg] 62.4 kg (09/29 0500) Last BM Date : 11/13/21  Physical Exam General:   Alert and oriented, ill and frail appearing.  Head:  Normocephalic and atraumatic. Eyes:  No icterus, sclera clear. Conjuctiva pink.  Heart:  S1, S2 present, no murmurs noted.  Lungs: Clear to auscultation bilaterally, without wheezing, rales, or rhonchi.  Abdomen:  Bowel sounds present, soft, non-tender, non-distended. No HSM or hernias noted. No rebound or guarding. No masses appreciated  Msk:  Symmetrical without gross deformities. Normal posture. Extremities:  Without clubbing or edema. Neurologic:  Alert and  oriented x4;  grossly normal neurologically. Skin:  Warm and dry, intact without significant lesions.  Psych:  Alert and cooperative. Normal mood and affect.  Intake/Output from previous day: 09/28 0701 - 09/29 0700 In: 540 [P.O.:540] Out: -  Intake/Output this shift: No intake/output data recorded.  Lab  Results  Recent Labs    11/12/21 0418 11/13/21 0320  WBC 13.8* 8.2  HGB 11.6* 11.2*  HCT 34.6* 33.5*  PLT 213 189   BMET Recent Labs    11/12/21 0418 11/13/21 0320 11/14/21 0330  NA 132* 130* 132*  K 4.6 4.5 4.4  CL 103 105 104  CO2 18* 20* 21*  GLUCOSE 116* 132* 107*  BUN 24* 23 19  CREATININE 0.84 0.81 0.75  CALCIUM 7.1* 6.9* 6.8*   LFT Recent Labs    11/12/21 0418  PROT 3.9*  ALBUMIN 1.6*  AST 17  ALT 18  ALKPHOS 84  BILITOT 1.1   PT/INR No results for input(s): "LABPROT", "INR" in the last 72 hours. Hepatitis Panel No results for input(s): "HEPBSAG", "HCVAB", "HEPAIGM", "HEPBIGM" in the last 72 hours.  C-Diff PCR negative  Studies/Results CT ABDOMEN PELVIS W CONTRAST  Result Date: 11/10/2021 CLINICAL DATA:  Abdominal pain. Patient is currently on chemo and radiation for rectal cancer. EXAM: CT ABDOMEN AND PELVIS WITH CONTRAST TECHNIQUE: Multidetector CT imaging of the abdomen and pelvis was performed using the standard protocol following bolus administration of intravenous contrast. RADIATION DOSE REDUCTION: This exam was performed according to the departmental dose-optimization program which includes automated exposure control, adjustment of the mA and/or kV according to patient size and/or use of iterative reconstruction technique. CONTRAST:  46m OMNIPAQUE IOHEXOL 300 MG/ML  SOLN COMPARISON:  CT chest abdomen and pelvis 08/21/2021. MRI abdomen 09/29/2021. FINDINGS: Lower chest: There is a new small right pleural effusion. Hepatobiliary: Again seen is a calcified granuloma in the liver. No new  hepatic lesions are seen. Gallbladder and bile ducts are within normal limits. Pancreas: Unremarkable. No pancreatic ductal dilatation or surrounding inflammatory changes. Spleen: Normal in size without focal abnormality. Adrenals/Urinary Tract: Adrenal glands are unremarkable. Kidneys are normal, without renal calculi, focal lesion, or hydronephrosis. Bladder is  unremarkable. Stomach/Bowel: There is circumferential wall thickening and mild inflammation of the rectosigmoid colon. Air-fluid levels are seen throughout the remaining colon. Multiple small bowel loops in the pelvis and the terminal ileum demonstrate circumferential wall thickening with associated mesenteric edema and mild surrounding inflammation compatible with enteritis. There is upstream dilatation of small bowel with air-fluid levels measuring up to 3.2 cm. There is a large hiatal hernia. The stomach is nondilated. No evidence for pneumatosis, portal venous gas, or free air. Appendix is not seen. Vascular/Lymphatic: Aorta and IVC are normal in size. There are prominent perirectal lymph nodes present. These have decreased in size when compared to the prior study. No new enlarged lymph nodes are seen. Reproductive: Uterus and bilateral adnexa are unremarkable. Other: There is new small amount of free fluid throughout the abdomen and pelvis. There is new body wall edema. No focal abdominal wall hernia. Musculoskeletal: No acute or significant osseous findings. IMPRESSION: 1. There is wall thickening and inflammation of ileal loops and terminal ileum compatible with nonspecific enteritis. There is resulted upstream small bowel obstruction. 2. Rectosigmoid colon wall thickening compatible with proctocolitis. 3. Perirectal lymphadenopathy has mildly decreased. 4. New small volume ascites and anasarca. 5. New small right pleural effusion. Electronically Signed   By: Ronney Asters M.D.   On: 11/10/2021 18:31   DG Abd 2 Views  Result Date: 11/10/2021 CLINICAL DATA:  Diarrhea, abdominal pain EXAM: ABDOMEN - 2 VIEW COMPARISON:  Chest radiographs done earlier today FINDINGS: There is abnormal dilation of multiple small bowel loops in upper abdomen measuring up to 4.3 cm in diameter. Stomach is not distended. Colon is not distended. There is no pneumoperitoneum. There is 3 mm calcific density overlying the right  kidney, possibly renal stone. Right pleural effusion is seen. IMPRESSION: There is abnormal dilation of small-bowel loops suggesting possible partial small bowel obstruction. If clinically warranted, follow-up CT may be considered. Possible small right renal stone. Electronically Signed   By: Elmer Picker M.D.   On: 11/10/2021 13:27   DG CHEST PORT 1 VIEW  Result Date: 11/10/2021 CLINICAL DATA:  Leukocytosis EXAM: PORTABLE CHEST 1 VIEW COMPARISON:  CT 08/21/2021 FINDINGS: The cardiomediastinal silhouette is within normal limits. There is a small right pleural effusion and adjacent right basilar opacities. Skin folds overlie the chest bilaterally. No evidence of pneumothorax. Dilated loops of small bowel in the left upper abdomen. IMPRESSION: Small right pleural effusion with adjacent right basilar opacities, could be atelectasis or infection. Dilated loops of small bowel in the upper left abdomen, correlate with exam and consider abdominal radiographs. Electronically Signed   By: Maurine Simmering M.D.   On: 11/10/2021 10:06   CT ABDOMEN PELVIS WO CONTRAST  Result Date: 11/04/2021 Shirline Frees     11/04/2021  5:42 PM Pt refused CT Abe/Pel scan    Assessment  76 y.o. female with a history of C. difficile in January 2023, persistent mild diarrhea thereafter and found to have circumferential adenocarcinoma of the rectum in June 2023, previously underwent chemo (Xeloda) and XRT completing 19/28 treatments thus far who presented with acute on chronic nonbloody diarrhea and weakness.  GI consulted for intractable diarrhea.  Acute on chronic diarrhea: Prior to presentation she had  baseline diarrhea in the setting of her circumferential nonobstructing rectal adenocarcinoma.  She had worsening of her diarrhea 2 weeks into her chemoradiation, last treatment September 13.  C. difficile and GI pathogen panel have been negative.  Over the course of her hospitalization, her regimen has been increased.  She was on  Lomotil and Questran without improvement in her diarrhea and had some leukocytosis on 1/25 and had CT A/P with contrast noting rectosigmoid colon wall thickening compatible with proctocolitis, multiple small bowel loops in the pelvis and TI demonstrating circumferential wall thickening with associated mesenteric edema, surrounding inflammation compatible with enteritis with upstream small bowel obstruction with dilation of small bowel with air-fluid levels measuring 3.2 cm.  She had new volume ascites and anasarca.  New small right pleural effusion.  Perirectal lymphadenopathy mildly decreased.  She has not presented with clinical signs of bowel obstruction.  The etiology of her diarrhea, ileitis, proctocolitis is suspected to be secondary to chemoradiation therapy.  She had repeat C. difficile that was negative.  CMV DNA is still pending.  Oncology has been following with patient and recommended octreotide in addition to the Lomotil and Questran which has improved her stool frequency.  Her sodium continues to be low, however her potassium and calcium have been stable.  No CBC checked today.  Prior magnesium levels have been normal.  Her appetite remains poor and she continues to fel very weak. Overall largely improved from presentation from a stool frequency stand point.      Plan / Recommendations  Encouragement for protein intake and frequent small meals Low fiber diet, avoid dairy products Continue lomotil, questran, and octreotide Follow up CMV DNA    LOS: 15 days    11/14/2021, 8:24 AM   Venetia Night, MSN, FNP-BC, AGACNP-BC [facility]

## 2021-11-14 NOTE — TOC Progression Note (Signed)
Transition of Care [facility]) - Progression Note    Patient Details  Name: ADALIND WEITZ MRN: [id number] Date of Birth: November 18, 1945  Transition of Care [facility]) CM/SW Contact  Shade Flood, LCSW Phone Number: 11/14/2021, 1:08 PM  Clinical Narrative:     TOC following. MD anticipating dc early next week. Bed offers presented to pt and her son. They select [facility]. Updated Marita [place] at [facility]. TOC will start insurance auth and follow up Monday.  Expected Discharge Plan: [place] Barriers to Discharge: Continued Medical Work up  Expected Discharge Plan and Services Expected Discharge Plan: [place] Choice: [place] arrangements for the past 2 months: [place]: PT [place]: [place] Date [place]: 11/11/21   Representative spoke with at [place]: [place] Determinants of Health ([place]) Interventions Housing Interventions: Intervention Not Indicated  Readmission Risk Interventions    11/05/2021    1:45 PM  Readmission Risk Prevention Plan  Transportation Screening Complete  HRI or [place] Complete  Social Work Consult for [place] Planning/Counseling Complete  Palliative Care Screening Not Applicable  Medication Review Press photographer) Complete

## 2021-11-14 NOTE — Progress Notes (Signed)
PT Cancellation Note  Patient Details Name: MELANA HINGLE MRN: [id number] DOB: 1945-09-16   Cancelled Treatment:    Reason Eval/Treat Not Completed: Patient declined, just received lunch and prefers to eat at this time. Will attempt to follow-up for PT session as time allows.   Candie Mile, PT, DPT Physical Therapist Acute Rehabilitation Services [facility]  11/14/2021, 12:14 PM

## 2021-11-14 NOTE — Progress Notes (Signed)
Patient slept on and off this night, awaking for medications. This nurse assisted CNA with toileting patient and repositioning the patient in the bed. No complaints of pain this shift.

## 2021-11-15 DIAGNOSIS — R197 Diarrhea, unspecified: Secondary | ICD-10-CM | POA: Diagnosis not present

## 2021-11-15 LAB — CBC WITH DIFFERENTIAL/PLATELET
Abs Immature Granulocytes: 0.7 10*3/uL — ABNORMAL HIGH (ref 0.00–0.07)
Band Neutrophils: 22 %
Basophils Absolute: 0 10*3/uL (ref 0.0–0.1)
Basophils Relative: 0 %
Eosinophils Absolute: 0 10*3/uL (ref 0.0–0.5)
Eosinophils Relative: 0 %
HCT: 35.3 % — ABNORMAL LOW (ref 36.0–46.0)
Hemoglobin: 12.3 g/dL (ref 12.0–15.0)
Lymphocytes Relative: 4 %
Lymphs Abs: 0.4 10*3/uL — ABNORMAL LOW (ref 0.7–4.0)
MCH: 32.4 pg (ref 26.0–34.0)
MCHC: 34.8 g/dL (ref 30.0–36.0)
MCV: 92.9 fL (ref 80.0–100.0)
Metamyelocytes Relative: 5 %
Monocytes Absolute: 0.8 10*3/uL (ref 0.1–1.0)
Monocytes Relative: 7 %
Myelocytes: 1 %
Neutro Abs: 9.3 10*3/uL — ABNORMAL HIGH (ref 1.7–7.7)
Neutrophils Relative %: 61 %
Platelets: 189 10*3/uL (ref 150–400)
RBC: 3.8 MIL/uL — ABNORMAL LOW (ref 3.87–5.11)
RDW: 24.3 % — ABNORMAL HIGH (ref 11.5–15.5)
WBC Morphology: INCREASED
WBC: 11.2 10*3/uL — ABNORMAL HIGH (ref 4.0–10.5)
nRBC: 0 % (ref 0.0–0.2)

## 2021-11-15 LAB — CULTURE, BLOOD (ROUTINE X 2)
Culture: NO GROWTH
Culture: NO GROWTH
Special Requests: ADEQUATE
Special Requests: ADEQUATE

## 2021-11-15 MED ORDER — MEGESTROL ACETATE 400 MG/10ML PO SUSP
400.0000 mg | Freq: Two times a day (BID) | ORAL | Status: DC
  Administered 2021-11-15 – 2021-11-20 (×11): 400 mg via ORAL
  Filled 2021-11-15 (×12): qty 10

## 2021-11-15 NOTE — Progress Notes (Signed)
PROGRESS NOTE  Regina Knox  LTJ:[id number] DOB: March 26, 1945 DOA: 10/30/2021 PCP: Chevis Pretty, FNP   Brief Narrative:  Patient is a 76 year old female with history of anemia, asthma, hyperlipidemia, GERD, hypertension, colorectal cancer who presented with intractable diarrhea secondary to recent radiation therapy, weakness.  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  Oncology, general surgery, palliative care following.  Hospital course remarkable for persistent diarrhea, weakness.  Overall condition slowly improving, diuretics resumed.  Still has poor oral intake.  On Lomotil, Questran, subcutaneous octreotide.  Ultimate plan is to discharge her to skilled nursing  facility.  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  - C. difficile, GI pathogen panel negative.   -Continue Lomotil, questran, and Octreotide.   -GI consult appreciated GI following.Recent CT abdomen/pelvis showed circumferential wall thickening, mild inflammation of the rectosigmoid colon, air-fluid level throughout the remaining colon, multiple small bowel loops in the pelvis.   Currently on solid diet.  Chemo and malignancy induced anorexia/FTT--- give Megace for appetite stimulation -Nutritional supplements as ordered  Vaginal bleeding---??  Radiation vaginitis -Hgb stable =-Outpatient follow-up with GYN advised  History of rectal cancer: Patient was on Xeloda and was receiving radiation treatment.  Patient is not interested on continuing chemotherapy.  Patient following with surgery to discuss  about surgical options -Hgb stable  COPD: Currently stable.  Continue bronchodilators as needed  Hypokalemia: Supplemented and corrected  Hyponatremia: Sodium improved.  We will continue monitor.  Non-anion gap metabolic disease: Likely due to loss of bicarb through diarrhea.  Continue sodium bicarb tablets  Hypertension: At home was on olmesartan, hydrochlorothiazide currently holding.  Blood pressure soft  Peripheral edema :  in the setting of IV fluids, hypoalbuminemia.  IV fluids discontinued.   Normocytic anemia: Currently hemoglobin stable  Leukocytosis: Resolved.  Cultures have been negative so far.  UA was not consistent with UTI.  Chest x-ray did not show pneumonia.  Weakness/debility:/Dispo PT/OT saw her, recommended SNF on discharge  Goals of care: Palliative care also following.  Current plan is to continue full scope of treatment, full code, outpatient follow-up with palliative services with hospice of [facility]  Nutrition Problem: Increased nutrient needs Etiology: acute illness, cancer and cancer related treatments, chronic illness Pressure Injury 11/12/21 Buttocks Right;Medial Stage 2 -  Partial thickness loss of dermis presenting as a shallow open injury with a red, pink wound bed without slough. 1cm x 1cm x 0cm (Active)  11/12/21 1000  Location: Buttocks  Location Orientation: Right;Medial  Staging: Stage 2 -  Partial thickness loss of dermis presenting as a shallow open injury with a red, pink wound bed without slough.  Wound Description (Comments): 1cm x 1cm x 0cm  Present on Admission: No  Dressing Type Foam - Lift dressing to assess site every shift 11/13/21 2115    DVT prophylaxis:Place TED hose Start: 11/06/21 1034 heparin injection 5,000 Units Start: 10/30/21 2215 SCDs Start: 10/30/21 2209     Code Status: Full Code  Family Communication: -Attempted to reach son unsuccessfully Patient status:Inpatient  Patient is from :Home  Anticipated  discharge to:SNF  Estimated DC date: 2-3 days  Consultants: GI, general surgery, palliative care  Procedures: None  Antimicrobials:  Anti-infectives (From admission, onward)    None       Subjective: -Concerns about vaginal bleeding -Loose stools improving No fever  Or chills   Objective: Vitals:   11/14/21 0527 11/14/21 2137 11/15/21 0429 11/15/21 0500  BP: 122/85 118/79 108/73   Pulse: (!) 40 (!) 103 99   Resp:  16 14   Temp: (!) 97.5 F (36.4 C) (!) 97.5 F (36.4 C) 97.8 F (36.6 C)   TempSrc: Oral Oral Oral   SpO2: 98% 97% 96%   Weight:    62.5 kg  Height:        Intake/Output Summary (Last 24 hours) at 11/15/2021 1356 Last data filed at 11/14/2021 1700 Gross per 24 hour  Intake 120 ml  Output --  Net 120 ml   Filed Weights   11/12/21 0446 11/14/21 0500 11/15/21 0500  Weight: 63.8 kg 62.4 kg 62.5 kg   Physical Exam  Gen:- Awake Alert, in no acute distress , frail appearing HEENT:- [place].AT, No sclera icterus Neck-Supple Neck,No JVD,.  Lungs-  CTAB , fair air movement bilaterally  CV- S1, S2 normal, RRR Abd-  +ve B.Sounds, Abd Soft, No tenderness,    Extremity/Skin:- No  edema,   good pedal pulses  Psych-affect is appropriate, oriented x3 Neuro-generalized weakness, no new focal deficits, no tremors    Data Reviewed: I have personally reviewed following labs and imaging studies  CBC: Recent Labs  Lab 11/09/21 0638 11/10/21 0603 11/11/21 0350 11/12/21 0418 11/13/21 0320 11/15/21 0830  WBC 7.8 14.3* 14.5* 13.8* 8.2 11.2*  NEUTROABS 6.4 13.6* 13.5* 9.8*  --  9.3*  HGB 11.5* 11.8* 11.6* 11.6* 11.2* 12.3  HCT 33.2* 35.1* 34.4* 34.6* 33.5* 35.3*  MCV 92.0 93.4 95.3 93.5 94.6 92.9  PLT 222 239 208 213 189 154   Basic Metabolic Panel: Recent Labs  Lab 11/09/21 0638 11/10/21 0603 11/11/21 0350 11/12/21 0418 11/13/21 0320 11/14/21 0330  NA 133* 134* 134* 132* 130* 132*  K 3.6 4.1 4.7 4.6 4.5 4.4  CL 100 101 103 103 105 104  CO2 25 23  19* 18* 20* 21*  GLUCOSE 96 95 78 116* 132* 107*  BUN 22 23 24* 24* 23 19  CREATININE 0.74 0.72 0.76 0.84 0.81 0.75  CALCIUM 7.1* 7.1* 6.8* 7.1* 6.9* 6.8*  MG 2.0 2.1 2.2 2.2  --   --   PHOS 3.2 2.8 2.7 3.1  --   --     Recent Results (from the past 240 hour(s))  Culture, blood (Routine X 2) w Reflex to ID Panel     Status: None   Collection Time: 11/10/21  9:35 AM   Specimen: BLOOD  Result Value Ref Range Status   Specimen Description BLOOD RIGHT ASSIST CONTROL  Final   Special Requests   Final    BOTTLES DRAWN AEROBIC AND ANAEROBIC Blood Culture adequate volume   Culture   Final    NO GROWTH 5 DAYS Performed at [facility], [address] [place], [place] [address]    Report Status 11/15/2021 FINAL  Final  Culture, blood (Routine X 2) w Reflex to ID Panel     Status: None   Collection Time: 11/10/21  9:35 AM   Specimen: BLOOD  Result Value Ref Range Status   Specimen Description BLOOD RIGHT HAND  Final   Special Requests   Final    BOTTLES DRAWN AEROBIC AND ANAEROBIC Blood Culture adequate  volume   Culture   Final    NO GROWTH 5 DAYS Performed at [facility], [address], [place], [place] [address]    Report Status 11/15/2021 FINAL  Final  Urine Culture     Status: Abnormal   Collection Time: 11/10/21  9:40 AM   Specimen: Urine, Clean Catch  Result Value Ref Range Status   Specimen Description   Final    URINE, CLEAN CATCH Performed at [facility], [address], [place], [place] [address]    Special Requests   Final    NONE Performed at [facility], [address] [place], [place] [address]    Culture (A)  Final    20,000 COLONIES/mL ESCHERICHIA COLI 30,000 COLONIES/mL ENTEROCOCCUS FAECALIS    Report Status 11/14/2021 FINAL  Final   Organism ID, Bacteria ESCHERICHIA COLI (A)  Final   Organism ID, Bacteria ENTEROCOCCUS FAECALIS (A)  Final      Susceptibility   Escherichia coli - MIC*    AMPICILLIN <=2 SENSITIVE Sensitive     CEFAZOLIN <=4 SENSITIVE  Sensitive     CEFEPIME <=0.12 SENSITIVE Sensitive     CEFTRIAXONE <=0.25 SENSITIVE Sensitive     CIPROFLOXACIN <=0.25 SENSITIVE Sensitive     GENTAMICIN <=1 SENSITIVE Sensitive     IMIPENEM <=0.25 SENSITIVE Sensitive     NITROFURANTOIN <=16 SENSITIVE Sensitive     TRIMETH/SULFA <=20 SENSITIVE Sensitive     AMPICILLIN/SULBACTAM <=2 SENSITIVE Sensitive     PIP/TAZO <=4 SENSITIVE Sensitive     * 20,000 COLONIES/mL ESCHERICHIA COLI   Enterococcus faecalis - MIC*    AMPICILLIN <=2 SENSITIVE Sensitive     NITROFURANTOIN <=16 SENSITIVE Sensitive     VANCOMYCIN 1 SENSITIVE Sensitive     * 30,000 COLONIES/mL ENTEROCOCCUS FAECALIS  C Difficile Quick Screen (NO PCR Reflex)     Status: None   Collection Time: 11/12/21 11:00 AM   Specimen: STOOL  Result Value Ref Range Status   C Diff antigen NEGATIVE NEGATIVE Final   C Diff toxin NEGATIVE NEGATIVE Final   C Diff interpretation No C. difficile detected.  Final    Comment: Performed at [facility], [address] [place], [place] [address]    Radiology Studies: No results found.  Scheduled Meds:  ascorbic acid  500 mg Oral BID   cholestyramine  4 g Oral Daily   dicyclomine  10 mg Oral TID   diphenoxylate-atropine  2 tablet Oral Q6H   feeding supplement  1 Container Oral TID BM   Fluticasone Furoate  1 Inhalation Inhalation Daily   heparin  5,000 Units Subcutaneous Q8H   megestrol  400 mg Oral BID   multivitamin with minerals  1 tablet Oral Daily   octreotide  200 mcg Subcutaneous TID   pantoprazole  40 mg Oral Daily   sodium bicarbonate  650 mg Oral BID   zinc sulfate  220 mg Oral Daily   Continuous Infusions:   LOS: 16 days   Roxan Hockey, MD [facility] P9/30/2023, 1:56 PM

## 2021-11-15 NOTE — Progress Notes (Signed)
Upon providing peri care  a medium amount of bright red blood was noted coming from vagina. MD was notified.

## 2021-11-16 DIAGNOSIS — R197 Diarrhea, unspecified: Secondary | ICD-10-CM | POA: Diagnosis not present

## 2021-11-16 LAB — CBC
HCT: 34 % — ABNORMAL LOW (ref 36.0–46.0)
Hemoglobin: 11.6 g/dL — ABNORMAL LOW (ref 12.0–15.0)
MCH: 31.7 pg (ref 26.0–34.0)
MCHC: 34.1 g/dL (ref 30.0–36.0)
MCV: 92.9 fL (ref 80.0–100.0)
Platelets: 188 10*3/uL (ref 150–400)
RBC: 3.66 MIL/uL — ABNORMAL LOW (ref 3.87–5.11)
RDW: 24.4 % — ABNORMAL HIGH (ref 11.5–15.5)
WBC: 10.9 10*3/uL — ABNORMAL HIGH (ref 4.0–10.5)
nRBC: 0 % (ref 0.0–0.2)

## 2021-11-16 LAB — COMPREHENSIVE METABOLIC PANEL
ALT: 18 U/L (ref 0–44)
AST: 21 U/L (ref 15–41)
Albumin: <1.5 g/dL — ABNORMAL LOW (ref 3.5–5.0)
Alkaline Phosphatase: 98 U/L (ref 38–126)
Anion gap: 7 (ref 5–15)
BUN: 21 mg/dL (ref 8–23)
CO2: 22 mmol/L (ref 22–32)
Calcium: 6.4 mg/dL — CL (ref 8.9–10.3)
Chloride: 100 mmol/L (ref 98–111)
Creatinine, Ser: 0.91 mg/dL (ref 0.44–1.00)
GFR, Estimated: >60 mL/min (ref >60)
Glucose, Bld: 114 mg/dL — ABNORMAL HIGH (ref 70–99)
Potassium: 4.4 mmol/L (ref 3.5–5.1)
Sodium: 129 mmol/L — ABNORMAL LOW (ref 135–145)
Total Bilirubin: 0.6 mg/dL (ref 0.3–1.2)
Total Protein: 3.7 g/dL — ABNORMAL LOW (ref 6.5–8.1)

## 2021-11-16 MED ORDER — LOPERAMIDE HCL 2 MG PO CAPS
4.0000 mg | ORAL_CAPSULE | Freq: Three times a day (TID) | ORAL | Status: AC
  Administered 2021-11-16 – 2021-11-20 (×11): 4 mg via ORAL
  Filled 2021-11-16 (×12): qty 2

## 2021-11-16 MED ORDER — AMOXICILLIN 250 MG PO CAPS
500.0000 mg | ORAL_CAPSULE | Freq: Three times a day (TID) | ORAL | Status: DC
  Administered 2021-11-16 – 2021-11-17 (×4): 500 mg via ORAL
  Filled 2021-11-16 (×4): qty 2

## 2021-11-16 NOTE — Progress Notes (Signed)
Pt was ambulated to the chair and was able to it up for a hour before wanting to go back to bed. Pt is weak and requires a lot of assistance. Pt denies any pain at this time.

## 2021-11-16 NOTE — Progress Notes (Addendum)
PROGRESS NOTE  Regina Knox  QPR:[id number] DOB: 03/26/1945 DOA: 10/30/2021 PCP: Chevis Pretty, FNP   Brief Narrative:  Patient is a 76 year old female with history of anemia, asthma, hyperlipidemia, GERD, hypertension, colorectal cancer who presented with intractable diarrhea secondary to recent radiation therapy, weakness.  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  Oncology, general surgery, palliative care following.  Hospital course remarkable for persistent diarrhea, weakness.  Overall condition slowly improving, diuretics resumed.  Still has poor oral intake.  On Lomotil, Questran, subcutaneous octreotide.  Ultimate plan is to discharge her to skilled nursing  facility.  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  -WBC is down to 10.9 from 14.5 on 11/11/21  - C. difficile, GI pathogen panel negative.   -Continue Lomotil, questran, and Octreotide.   -Diarrhea improving slowly , but persist - will add Imodium -Consider stopping octreotide once diarrhea improves further -GI consult appreciated -Recent CT abdomen/pelvis showed circumferential wall thickening, mild inflammation of the rectosigmoid colon, air-fluid level throughout the remaining colon, multiple small bowel loops in the pelvis-findings consistent with enteritis and proctocolitis -Diet has been advanced  Chemo and malignancy induced anorexia/FTT--- started Megace for appetite stimulation -Nutritional supplements as ordered  Vaginal bleeding---??  Radiation vaginitis -Hgb stable =-Outpatient follow-up  with GYN advised  History of rectal cancer: Patient was on Xeloda and was receiving radiation treatment.  Patient is not interested on continuing chemotherapy. -  Patient following with surgery to discuss about surgical options -Hgb stable  COPD: Currently stable.  Continue bronchodilators as needed  Hypokalemia: Supplemented and corrected  -Pseudo hypocalcemia--- when corrected for very low albumin, calcium is actually close to normal  Hyponatremia: Suspect dehydration related in the setting of GI losses and poor oral intake -Maintain adequate hydration  Non-anion gap metabolic disease: Likely due to loss of bicarb through diarrhea. -Improving with sodium bicarb tablets  Hypertension: Continue to hold olmesartan and  hydrochlorothiazide as BP is soft and patient had significant GI losses/diarrhea  Peripheral edema :   hypoalbuminemia.  -Judicious with IV fluids to avoid third spacing   Normocytic anemia: Currently hemoglobin stable  Weakness/debility:/Dispo PT/OT saw her, recommended SNF on discharge  Goals of care: Palliative care also following.  Current plan is to continue full scope of treatment, full code, outpatient follow-up with palliative services with hospice of [facility].  Nutrition Problem: Increased nutrient needs Etiology: acute illness, cancer and cancer related treatments, chronic illness Pressure Injury 11/12/21 Buttocks Right;Medial Stage 2 -  Partial thickness loss of dermis presenting as a shallow open injury with a red, pink wound bed without slough. 1cm x 1cm x 0cm (Active)  11/12/21 1000  Location: Buttocks  Location Orientation: Right;Medial  Staging: Stage 2 -  Partial thickness loss of dermis presenting as a shallow open injury with a red, pink wound bed without slough.  Wound Description (Comments): 1cm x 1cm x 0cm  Present on Admission: No  Dressing Type Foam - Lift dressing to assess site every shift 11/15/21 1945    DVT prophylaxis:Place  TED hose Start: 11/06/21 1034 heparin injection 5,000 Units Start: 10/30/21 2215 SCDs  Start: 10/30/21 2209     Code Status: Full Code  Family Communication: -Discussed with patient's son [name] at bedside on 11/16/2021 patient status:Inpatient  Patient is from :Home  Anticipated discharge to:SNF pending insurance authorization  Estimated DC date: In a.m. if SNF bed is available and insurance authorization is obtained  Consultants: GI, general surgery, palliative care  Procedures: None  Antimicrobials:  Anti-infectives (From admission, onward)    Start     Dose/Rate Route Frequency Ordered Stop   11/16/21 1645  amoxicillin (AMOXIL) capsule 500 mg        500 mg Oral Every 8 hours 11/16/21 1547 11/21/21 1359       Subjective: -No further vaginal bleeding -Patient's son [name] is at bedside -Appetite and oral intake remains poor- -planes of fatigue and generalized weakness -4 loose/watery bowel movements in the last 12 hours  Objective: Vitals:   11/15/21 0500 11/15/21 1342 11/15/21 2046 11/16/21 0548  BP:  106/72 104/69 107/78  Pulse:  (!) 104 (!) 105 (!) 102  Resp:   15 15  Temp:  (!) 97.5 F (36.4 C) 97.6 F (36.4 C) 97.9 F (36.6 C)  TempSrc:  Oral Oral Oral  SpO2:  97% 94% 96%  Weight: 62.5 kg     Height:        Intake/Output Summary (Last 24 hours) at 11/16/2021 1550 Last data filed at 11/16/2021 1300 Gross per 24 hour  Intake 360 ml  Output --  Net 360 ml   Filed Weights   11/12/21 0446 11/14/21 0500 11/15/21 0500  Weight: 63.8 kg 62.4 kg 62.5 kg   Physical Exam  Gen:- Awake Alert, in no acute distress , frail appearing HEENT:- [place].AT, No sclera icterus Neck-Supple Neck,No JVD,.  Lungs-  CTAB , fair air movement bilaterally  CV- S1, S2 normal, RRR Abd-  +ve B.Sounds, Abd Soft, No significant tenderness,    Extremity/Skin:- No  edema,   good pedal pulses  Psych-affect is appropriate, oriented x3 Neuro-generalized weakness, no new focal  deficits, no tremors    Data Reviewed: I have personally reviewed following labs and imaging studies  CBC: Recent Labs  Lab 11/10/21 0603 11/11/21 0350 11/12/21 0418 11/13/21 0320 11/15/21 0830 11/16/21 0511  WBC 14.3* 14.5* 13.8* 8.2 11.2* 10.9*  NEUTROABS 13.6* 13.5* 9.8*  --  9.3*  --   HGB 11.8* 11.6* 11.6* 11.2* 12.3 11.6*  HCT 35.1* 34.4* 34.6* 33.5* 35.3* 34.0*  MCV 93.4 95.3 93.5 94.6 92.9 92.9  PLT 239 208 213 189 189 242   Basic Metabolic Panel: Recent Labs  Lab 11/10/21 0603 11/11/21 0350 11/12/21 0418 11/13/21 0320 11/14/21 0330 11/16/21 0511  NA 134* 134* 132* 130* 132* 129*  K 4.1 4.7 4.6 4.5 4.4 4.4  CL 101 103 103 105 104 100  CO2 23 19* 18* 20* 21* 22  GLUCOSE 95 78 116* 132* 107* 114*  BUN 23 24* 24* '23 19 21  '$ CREATININE 0.72 0.76 0.84 0.81 0.75 0.91  CALCIUM 7.1* 6.8* 7.1* 6.9* 6.8* 6.4*  MG 2.1 2.2 2.2  --   --   --   PHOS 2.8 2.7 3.1  --   --   --     Recent Results (from the past 240 hour(s))  Culture, blood (Routine X 2) w Reflex to ID Panel     Status: None   Collection Time: 11/10/21  9:35 AM   Specimen: BLOOD  Result Value Ref Range Status   Specimen Description BLOOD RIGHT ASSIST CONTROL  Final   Special Requests   Final    BOTTLES DRAWN AEROBIC AND ANAEROBIC Blood Culture adequate volume   Culture   Final    NO GROWTH 5 DAYS Performed at [facility], [address], [place], [place] [address]    Report Status 11/15/2021 FINAL  Final  Culture, blood (Routine X 2) w Reflex to ID Panel     Status: None   Collection Time: 11/10/21  9:35 AM   Specimen: BLOOD  Result Value Ref Range Status   Specimen Description BLOOD RIGHT HAND  Final   Special Requests   Final    BOTTLES DRAWN AEROBIC AND ANAEROBIC Blood Culture adequate volume   Culture   Final    NO GROWTH 5 DAYS Performed at [facility], [address] [place], [place] [address]    Report Status 11/15/2021 FINAL  Final  Urine Culture     Status: Abnormal   Collection  Time: 11/10/21  9:40 AM   Specimen: Urine, Clean Catch  Result Value Ref Range Status   Specimen Description   Final    URINE, CLEAN CATCH Performed at [facility], [address], [place], [place] [address]    Special Requests   Final    NONE Performed at [facility], [address] [place], [place] [address]    Culture (A)  Final    20,000 COLONIES/mL ESCHERICHIA COLI 30,000 COLONIES/mL ENTEROCOCCUS FAECALIS    Report Status 11/14/2021 FINAL  Final   Organism ID, Bacteria ESCHERICHIA COLI (A)  Final   Organism ID, Bacteria ENTEROCOCCUS FAECALIS (A)  Final      Susceptibility   Escherichia coli - MIC*    AMPICILLIN <=2 SENSITIVE Sensitive     CEFAZOLIN <=4 SENSITIVE Sensitive     CEFEPIME <=0.12 SENSITIVE Sensitive     CEFTRIAXONE <=0.25 SENSITIVE Sensitive     CIPROFLOXACIN <=0.25 SENSITIVE Sensitive     GENTAMICIN <=1 SENSITIVE Sensitive     IMIPENEM <=0.25 SENSITIVE Sensitive     NITROFURANTOIN <=16 SENSITIVE Sensitive     TRIMETH/SULFA <=20 SENSITIVE Sensitive     AMPICILLIN/SULBACTAM <=2 SENSITIVE Sensitive     PIP/TAZO <=4 SENSITIVE Sensitive     * 20,000 COLONIES/mL ESCHERICHIA COLI   Enterococcus faecalis - MIC*    AMPICILLIN <=2 SENSITIVE Sensitive     NITROFURANTOIN <=16 SENSITIVE Sensitive     VANCOMYCIN 1 SENSITIVE Sensitive     * 30,000 COLONIES/mL ENTEROCOCCUS FAECALIS  C Difficile Quick Screen (NO PCR Reflex)     Status: None   Collection Time: 11/12/21 11:00 AM   Specimen: STOOL  Result Value Ref Range Status   C Diff antigen NEGATIVE NEGATIVE Final   C Diff toxin NEGATIVE NEGATIVE Final   C Diff interpretation No C. difficile detected.  Final    Comment: Performed at [facility], [address] [place], [place] [address]    Radiology Studies: No results found.  Scheduled Meds:  amoxicillin  500 mg Oral Q8H   ascorbic acid  500 mg Oral BID   cholestyramine  4 g Oral Daily   dicyclomine  10 mg Oral TID   diphenoxylate-atropine  2 tablet Oral Q6H    feeding supplement  1 Container Oral TID BM   Fluticasone Furoate  1 Inhalation Inhalation Daily   heparin  5,000 Units Subcutaneous Q8H   loperamide  4 mg Oral TID   megestrol  400 mg Oral BID   multivitamin with minerals  1 tablet Oral Daily   octreotide  200 mcg Subcutaneous TID   pantoprazole  40 mg Oral Daily   sodium bicarbonate  650 mg Oral BID   zinc sulfate  220 mg Oral Daily   Continuous Infusions:   LOS: 17 days   Roxan Hockey, MD [facility] P10/02/2021, 3:50 PM

## 2021-11-16 NOTE — Progress Notes (Signed)
Date and time results received: 11/16/21 1100  (use smartphrase ".now" to insert current time)  Test: Calcium  Critical Value: 6.4  Name of Provider Notified: Denton Brick, courage MD  Orders Received? Or Actions Taken?: No new orders

## 2021-11-16 NOTE — Progress Notes (Signed)
Patient had used Pulmicort inhaler, had her rinse mouth.

## 2021-11-16 NOTE — Progress Notes (Signed)
Patient with improvement in her diarrhea, having approximately 4 stools daily.  Continue on subcutaneous octreotide until diarrhea resolves.  Continue Lomotil, Questran.  CMV DNA positive though less than 200, this is unlikely cause of patient's colitis/enteritis.  Likely due to radiation.  Plan discharge to skilled nursing facility.  GI to sign off, please call with any questions concerns.

## 2021-11-17 DIAGNOSIS — I10 Essential (primary) hypertension: Secondary | ICD-10-CM | POA: Diagnosis not present

## 2021-11-17 DIAGNOSIS — R197 Diarrhea, unspecified: Secondary | ICD-10-CM | POA: Diagnosis not present

## 2021-11-17 DIAGNOSIS — K529 Noninfective gastroenteritis and colitis, unspecified: Secondary | ICD-10-CM | POA: Diagnosis not present

## 2021-11-17 LAB — RENAL FUNCTION PANEL
Albumin: <1.5 g/dL — ABNORMAL LOW (ref 3.5–5.0)
Anion gap: 7 (ref 5–15)
BUN: 24 mg/dL — ABNORMAL HIGH (ref 8–23)
CO2: 21 mmol/L — ABNORMAL LOW (ref 22–32)
Calcium: 6.7 mg/dL — ABNORMAL LOW (ref 8.9–10.3)
Chloride: 99 mmol/L (ref 98–111)
Creatinine, Ser: 0.67 mg/dL (ref 0.44–1.00)
GFR, Estimated: >60 mL/min (ref >60)
Glucose, Bld: 119 mg/dL — ABNORMAL HIGH (ref 70–99)
Phosphorus: 2.7 mg/dL (ref 2.5–4.6)
Potassium: 4.1 mmol/L (ref 3.5–5.1)
Sodium: 127 mmol/L — ABNORMAL LOW (ref 135–145)

## 2021-11-17 MED ORDER — ALBUMIN HUMAN 25 % IV SOLN: 25.0000 g | Freq: Two times a day (BID) | INTRAVENOUS | Status: DC

## 2021-11-17 MED ORDER — ALBUMIN HUMAN 25 % IV SOLN
25.0000 g | Freq: Two times a day (BID) | INTRAVENOUS | Status: AC
  Administered 2021-11-17 – 2021-11-18 (×2): 25 g via INTRAVENOUS
  Filled 2021-11-17 (×2): qty 100

## 2021-11-17 MED ORDER — FUROSEMIDE 20 MG PO TABS
20.0000 mg | ORAL_TABLET | Freq: Every day | ORAL | Status: DC
  Administered 2021-11-17 – 2021-11-18 (×2): 20 mg via ORAL
  Filled 2021-11-17 (×2): qty 1

## 2021-11-17 MED ORDER — CALCIUM POLYCARBOPHIL 625 MG PO TABS
625.0000 mg | ORAL_TABLET | Freq: Two times a day (BID) | ORAL | Status: DC
  Administered 2021-11-17 – 2021-11-21 (×7): 625 mg via ORAL
  Filled 2021-11-17 (×10): qty 1

## 2021-11-17 NOTE — Progress Notes (Signed)
PT was ambulated to the [place] [facility] with a two person assist. Pt was able to sit up in the chair for about two hours today.

## 2021-11-17 NOTE — Progress Notes (Signed)
PROGRESS NOTE  Regina Knox  ZOX:[id number] DOB: Nov 19, 1945 DOA: 10/30/2021 PCP: Chevis Pretty, FNP   Brief Narrative:  Patient is a 76 year old female with history of anemia, asthma, hyperlipidemia, GERD, hypertension, colorectal cancer who presented with intractable diarrhea secondary to recent radiation therapy, weakness.  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  Oncology, general surgery, palliative care following.  Hospital course remarkable for persistent diarrhea, weakness.  Overall condition slowly improving, diuretics resumed.  Still has poor oral intake.  On Lomotil, Imodium, Questran, subcutaneous octreotide.  Ultimate plan is to discharge her to skilled nursing  facility.    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  -WBC is down to 10.9 from 14.5 on 11/11/21  - C. difficile, GI pathogen panel negative.   -Continue Lomotil, questran, Imodium and Octreotide.   -Diarrhea has been persistent - will add fiber to help bulk stools -Consider stopping octreotide once diarrhea improves further -GI consult appreciated -Recent CT abdomen/pelvis showed circumferential wall thickening, mild inflammation of the rectosigmoid colon, air-fluid level throughout the remaining colon, multiple small bowel loops in the pelvis-findings consistent with enteritis and proctocolitis -Diet has been advanced  Chemo and malignancy induced anorexia/FTT--- started Megace for appetite stimulation -Nutritional supplements as ordered  Vaginal bleeding---??  Radiation vaginitis -Hgb stable =-Outpatient follow-up  with GYN advised  History of rectal cancer: Patient was on Xeloda and was receiving radiation treatment.  Patient is not interested on continuing chemotherapy. -  Patient following with surgery to discuss about surgical options -Hgb stable  COPD: Currently stable.  Continue bronchodilators as needed  Hypokalemia: Supplemented and corrected  -Pseudo hypocalcemia--- when corrected for very low albumin, calcium is actually close to normal  Hyponatremia: Suspect dehydration related in the setting of GI losses and poor oral intake -Maintain adequate hydration  Non-anion gap metabolic disease: Likely due to loss of bicarb through diarrhea. -Improving with sodium bicarb tablets  Hypertension: Continue to hold olmesartan and  hydrochlorothiazide as BP is soft and patient had significant GI losses/diarrhea  Peripheral edema :   hypoalbuminemia.  -Started on albumin infusions and Lasix  Normocytic anemia: Currently hemoglobin stable  Weakness/debility:/Dispo PT/OT saw her, recommended SNF on discharge  Goals of care: Palliative care also following.  Current plan is to continue full scope of treatment, full code, outpatient follow-up with palliative services with hospice of [facility].  Nutrition Problem: Increased nutrient needs Etiology: acute illness, cancer and cancer related treatments, chronic illness Pressure Injury 11/12/21 Buttocks Right;Medial Stage 2 -  Partial thickness loss of dermis presenting as a shallow open injury with a red, pink wound bed without slough. 1cm x 1cm x 0cm (Active)  11/12/21 1000  Location: Buttocks  Location Orientation: Right;Medial  Staging: Stage 2 -  Partial thickness loss of dermis presenting as a shallow open injury with a red, pink wound bed without slough.  Wound Description (Comments): 1cm x 1cm x 0cm  Present on Admission: No  Dressing Type Foam - Lift dressing to assess site every shift 11/15/21 1945    DVT prophylaxis:Place TED hose  Start: 11/06/21 1034 heparin injection 5,000 Units Start: 10/30/21 2215 SCDs Start: 10/30/21 2209  Code Status: Full Code  Family Communication: No family present today  patient status:Inpatient  Patient is from :Home  Anticipated discharge to:SNF pending insurance authorization  Estimated DC date: Plan to discharge once diarrhea is better controlled  Consultants: GI, general surgery, palliative care  Procedures: None  Antimicrobials:  Anti-infectives (From admission, onward)    Start     Dose/Rate Route Frequency Ordered Stop   11/16/21 1645  amoxicillin (AMOXIL) capsule 500 mg        500 mg Oral Every 8 hours 11/16/21 1547 11/21/21 1359       Subjective: P.o. intake has been poor She feels weak She has had 4-5 bowel movements yesterday  Objective: Vitals:   11/16/21 2034 11/17/21 0526 11/17/21 0641 11/17/21 1519  BP: (!) 100/59 108/78  110/73  Pulse: 99 100  100  Resp: '14 18  16  '$ Temp: 98 F (36.7 C) 97.7 F (36.5 C)  97.8 F (36.6 C)  TempSrc: Oral Oral    SpO2: 96% 98%  97%  Weight:   65.3 kg   Height:        Intake/Output Summary (Last 24 hours) at 11/17/2021 1917 Last data filed at 11/17/2021 1230 Gross per 24 hour  Intake 240 ml  Output --  Net 240 ml   Filed Weights   11/14/21 0500 11/15/21 0500 11/17/21 0641  Weight: 62.4 kg 62.5 kg 65.3 kg   Physical Exam  Gen:- Awake Alert, in no acute distress , frail appearing HEENT:- [place].AT, No sclera icterus Neck-Supple Neck,No JVD,.  Lungs-  CTAB , fair air movement bilaterally  CV- S1, S2 normal, RRR Abd-  +ve B.Sounds, Abd Soft, No significant tenderness,    Extremity/Skin:- No  edema,   good pedal pulses  Psych-affect is appropriate, oriented x3 Neuro-generalized weakness, no new focal deficits, no tremors    Data Reviewed: I have personally reviewed following labs and imaging studies  CBC: Recent Labs  Lab 11/11/21 0350 11/12/21 0418 11/13/21 0320 11/15/21 0830 11/16/21 0511   WBC 14.5* 13.8* 8.2 11.2* 10.9*  NEUTROABS 13.5* 9.8*  --  9.3*  --   HGB 11.6* 11.6* 11.2* 12.3 11.6*  HCT 34.4* 34.6* 33.5* 35.3* 34.0*  MCV 95.3 93.5 94.6 92.9 92.9  PLT 208 213 189 189 607   Basic Metabolic Panel: Recent Labs  Lab 11/11/21 0350 11/12/21 0418 11/13/21 0320 11/14/21 0330 11/16/21 0511 11/17/21 0539  NA 134* 132* 130* 132* 129* 127*  K 4.7 4.6 4.5 4.4 4.4 4.1  CL 103 103 105 104 100 99  CO2 19* 18* 20* 21* 22 21*  GLUCOSE 78 116* 132* 107* 114* 119*  BUN 24* 24* '23 19 21 '$ 24*  CREATININE 0.76 0.84 0.81 0.75 0.91 0.67  CALCIUM 6.8* 7.1* 6.9* 6.8* 6.4* 6.7*  MG 2.2 2.2  --   --   --   --   PHOS 2.7 3.1  --   --   --  2.7    Recent Results (from the past 240 hour(s))  Culture, blood (Routine X 2) w Reflex to ID Panel     Status: None   Collection Time: 11/10/21  9:35 AM   Specimen: BLOOD  Result Value Ref Range Status   Specimen Description BLOOD RIGHT ASSIST CONTROL  Final   Special Requests   Final    BOTTLES DRAWN AEROBIC AND ANAEROBIC Blood Culture adequate volume   Culture   Final    NO GROWTH 5 DAYS Performed at [facility], [place]  [address] [place], [place] [address]    Report Status 11/15/2021 FINAL  Final  Culture, blood (Routine X 2) w Reflex to ID Panel     Status: None   Collection Time: 11/10/21  9:35 AM   Specimen: BLOOD  Result Value Ref Range Status   Specimen Description BLOOD RIGHT HAND  Final   Special Requests   Final    BOTTLES DRAWN AEROBIC AND ANAEROBIC Blood Culture adequate volume   Culture   Final    NO GROWTH 5 DAYS Performed at [facility], [address] [place], [place] [address]    Report Status 11/15/2021 FINAL  Final  Urine Culture     Status: Abnormal   Collection Time: 11/10/21  9:40 AM   Specimen: Urine, Clean Catch  Result Value Ref Range Status   Specimen Description   Final    URINE, CLEAN CATCH Performed at [facility], [address] [place], [place] [address]    Special Requests   Final     NONE Performed at [facility], [address], [place], [place] [address]    Culture (A)  Final    20,000 COLONIES/mL ESCHERICHIA COLI 30,000 COLONIES/mL ENTEROCOCCUS FAECALIS    Report Status 11/14/2021 FINAL  Final   Organism ID, Bacteria ESCHERICHIA COLI (A)  Final   Organism ID, Bacteria ENTEROCOCCUS FAECALIS (A)  Final      Susceptibility   Escherichia coli - MIC*    AMPICILLIN <=2 SENSITIVE Sensitive     CEFAZOLIN <=4 SENSITIVE Sensitive     CEFEPIME <=0.12 SENSITIVE Sensitive     CEFTRIAXONE <=0.25 SENSITIVE Sensitive     CIPROFLOXACIN <=0.25 SENSITIVE Sensitive     GENTAMICIN <=1 SENSITIVE Sensitive     IMIPENEM <=0.25 SENSITIVE Sensitive     NITROFURANTOIN <=16 SENSITIVE Sensitive     TRIMETH/SULFA <=20 SENSITIVE Sensitive     AMPICILLIN/SULBACTAM <=2 SENSITIVE Sensitive     PIP/TAZO <=4 SENSITIVE Sensitive     * 20,000 COLONIES/mL ESCHERICHIA COLI   Enterococcus faecalis - MIC*    AMPICILLIN <=2 SENSITIVE Sensitive     NITROFURANTOIN <=16 SENSITIVE Sensitive     VANCOMYCIN 1 SENSITIVE Sensitive     * 30,000 COLONIES/mL ENTEROCOCCUS FAECALIS  C Difficile Quick Screen (NO PCR Reflex)     Status: None   Collection Time: 11/12/21 11:00 AM   Specimen: STOOL  Result Value Ref Range Status   C Diff antigen NEGATIVE NEGATIVE Final   C Diff toxin NEGATIVE NEGATIVE Final   C Diff interpretation No C. difficile detected.  Final    Comment: Performed at [facility], [address], [place], [place] [address]    Radiology Studies: No results found.  Scheduled Meds:  amoxicillin  500 mg Oral Q8H   ascorbic acid  500 mg Oral BID   cholestyramine  4 g Oral Daily   dicyclomine  10 mg Oral TID   diphenoxylate-atropine  2 tablet Oral Q6H   feeding supplement  1 Container Oral TID BM   Fluticasone Furoate  1 Inhalation Inhalation Daily   heparin  5,000 Units Subcutaneous Q8H   loperamide  4 mg Oral TID   megestrol  400 mg Oral BID   multivitamin with minerals  1 tablet Oral  Daily   octreotide  200 mcg Subcutaneous TID   pantoprazole  40 mg Oral Daily   polycarbophil  625 mg Oral BID   sodium bicarbonate  650 mg Oral BID   zinc sulfate  220 mg Oral Daily  Continuous Infusions:   LOS: 18 days   Kathie Dike, MD [facility] P10/03/2021, 7:17 PM

## 2021-11-18 DIAGNOSIS — C189 Malignant neoplasm of colon, unspecified: Secondary | ICD-10-CM | POA: Diagnosis not present

## 2021-11-18 DIAGNOSIS — Z7189 Other specified counseling: Secondary | ICD-10-CM | POA: Diagnosis not present

## 2021-11-18 DIAGNOSIS — Z515 Encounter for palliative care: Secondary | ICD-10-CM | POA: Diagnosis not present

## 2021-11-18 DIAGNOSIS — R197 Diarrhea, unspecified: Secondary | ICD-10-CM | POA: Diagnosis not present

## 2021-11-18 LAB — RENAL FUNCTION PANEL
Albumin: 2.2 g/dL — ABNORMAL LOW (ref 3.5–5.0)
Anion gap: 9 (ref 5–15)
BUN: 22 mg/dL (ref 8–23)
CO2: 24 mmol/L (ref 22–32)
Calcium: 6.9 mg/dL — ABNORMAL LOW (ref 8.9–10.3)
Chloride: 98 mmol/L (ref 98–111)
Creatinine, Ser: 0.72 mg/dL (ref 0.44–1.00)
GFR, Estimated: >60 mL/min (ref >60)
Glucose, Bld: 119 mg/dL — ABNORMAL HIGH (ref 70–99)
Phosphorus: 2.7 mg/dL (ref 2.5–4.6)
Potassium: 3.6 mmol/L (ref 3.5–5.1)
Sodium: 131 mmol/L — ABNORMAL LOW (ref 135–145)

## 2021-11-18 LAB — CBC
HCT: 29.4 % — ABNORMAL LOW (ref 36.0–46.0)
Hemoglobin: 10 g/dL — ABNORMAL LOW (ref 12.0–15.0)
MCH: 31.7 pg (ref 26.0–34.0)
MCHC: 34 g/dL (ref 30.0–36.0)
MCV: 93.3 fL (ref 80.0–100.0)
Platelets: 183 10*3/uL (ref 150–400)
RBC: 3.15 MIL/uL — ABNORMAL LOW (ref 3.87–5.11)
RDW: 24.6 % — ABNORMAL HIGH (ref 11.5–15.5)
WBC: 9.4 10*3/uL (ref 4.0–10.5)
nRBC: 0 % (ref 0.0–0.2)

## 2021-11-18 NOTE — Progress Notes (Signed)
PROGRESS NOTE    Regina Knox  TML:[id number] DOB: 10-24-1945 DOA: 10/30/2021 PCP: Chevis Pretty, FNP   Brief Narrative:    Patient is a 76 year old female with history of anemia, asthma, hyperlipidemia, GERD, hypertension, colorectal cancer who presented with intractable diarrhea secondary to recent radiation therapy, weakness.  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  Oncology, general surgery, palliative care following.  Hospital course remarkable for persistent diarrhea, weakness.  Overall condition slowly improving, diuretics resumed.  Still has poor oral intake.  On Lomotil, Imodium, Questran, subcutaneous octreotide.  Ultimate plan is to discharge her to skilled nursing  facility.    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  -WBC is down to 10.9 from 14.5 on 11/11/21  - C. difficile, GI pathogen panel negative.   -Continue Lomotil, questran, Imodium and Octreotide.   -Diarrhea has been persistent - will add fiber to help bulk stools -Consider stopping octreotide once diarrhea improves further -GI consult appreciated -Recent CT abdomen/pelvis showed circumferential wall thickening, mild inflammation of the rectosigmoid colon, air-fluid level throughout the remaining colon, multiple small bowel loops in the pelvis-findings consistent with enteritis and proctocolitis -Diet has been advanced   Chemo and malignancy induced anorexia/FTT--- started Megace for appetite stimulation -Nutritional supplements as ordered   Vaginal bleeding---??  Radiation  vaginitis -Hgb stable =-Outpatient follow-up with GYN advised   History of rectal cancer: Patient was on Xeloda and was receiving radiation treatment.  Patient is not interested on continuing chemotherapy. -  Patient following with surgery to discuss about surgical options -Hgb stable   COPD: Currently stable.  Continue bronchodilators as needed   Hypokalemia: Supplemented and corrected   -Pseudo hypocalcemia--- when corrected for very low albumin, calcium is actually close to normal   Hyponatremia: Suspect dehydration related in the setting of GI losses and poor oral intake -Maintain adequate hydration   Non-anion gap metabolic disease: Likely due to loss of bicarb through diarrhea. -Improving with sodium bicarb tablets   Hypertension: Continue to hold olmesartan and  hydrochlorothiazide as BP is soft and patient had significant GI losses/diarrhea   Peripheral edema :   hypoalbuminemia.  -Started on albumin infusions and Lasix   Normocytic anemia: Currently hemoglobin stable   Weakness/debility:/Dispo PT/OT saw her, recommended SNF on discharge   Goals of care: Palliative care also following.  Current plan is to continue full scope of treatment, full code, outpatient follow-up with palliative services with hospice of [facility].   Nutrition Problem: Increased nutrient needs Etiology: acute illness, cancer and cancer related treatments, chronic illness     Pressure Injury 11/12/21 Buttocks Right;Medial Stage 2 -  Partial thickness loss of dermis presenting as a shallow open injury with a red, pink wound bed without slough. 1cm x 1cm x 0cm (Active)  11/12/21 1000  Location: Buttocks  Location Orientation: Right;Medial  Staging: Stage 2 -  Partial thickness loss of dermis presenting as a shallow open injury with a red, pink wound bed without slough.  Wound Description (Comments): 1cm x 1cm x 0cm  Present on Admission: No  Dressing Type Foam - Lift dressing to assess  site  every shift 11/15/21 1945      DVT prophylaxis:Place TED hose Start: 11/06/21 1034 heparin injection 5,000 Units Start: 10/30/21 2215 SCDs Start: 10/30/21 2209       Code Status: Full Code   Family Communication: No family present today   patient status:Inpatient   Patient is from :Home   Anticipated discharge to:SNF pending insurance authorization   Estimated DC date: Plan to discharge once diarrhea is better controlled   Consultants: GI, general surgery, palliative care   Procedures: None   Antimicrobials:  Anti-infectives (From admission, onward)    Start     Dose/Rate Route Frequency Ordered Stop   11/16/21 1645  amoxicillin (AMOXIL) capsule 500 mg  Status:  Discontinued        500 mg Oral Every 8 hours 11/16/21 1547 11/17/21 1943      Subjective: Patient seen and evaluated today with ongoing loose bowel movements approximately 5-6 times a day.  She feels quite weak and is not tolerating very much oral intake either.  Objective: Vitals:   11/17/21 1519 11/17/21 2144 11/18/21 0500 11/18/21 0616  BP: 110/73 104/68  (!) 98/59  Pulse: 100 91  87  Resp: '16 14  14  '$ Temp: 97.8 F (36.6 C) 97.8 F (36.6 C)  98.5 F (36.9 C)  TempSrc:  Oral    SpO2: 97% 98%  99%  Weight:   63 kg   Height:        Intake/Output Summary (Last 24 hours) at 11/18/2021 1302 Last data filed at 11/18/2021 0656 Gross per 24 hour  Intake 202.42 ml  Output --  Net 202.42 ml   Filed Weights   11/15/21 0500 11/17/21 0641 11/18/21 0500  Weight: 62.5 kg 65.3 kg 63 kg    Examination:  General exam: Appears calm and comfortable  Respiratory system: Clear to auscultation. Respiratory effort normal. Cardiovascular system: S1 & S2 heard, RRR.  Gastrointestinal system: Abdomen is soft Central nervous system: Alert and awake Extremities: No edema Skin: No significant lesions noted Psychiatry: Flat affect.    Data Reviewed: I have personally reviewed following labs and imaging  studies  CBC: Recent Labs  Lab 11/12/21 0418 11/13/21 0320 11/15/21 0830 11/16/21 0511 11/18/21 0332  WBC 13.8* 8.2 11.2* 10.9* 9.4  NEUTROABS 9.8*  --  9.3*  --   --   HGB 11.6* 11.2* 12.3 11.6* 10.0*  HCT 34.6* 33.5* 35.3* 34.0* 29.4*  MCV 93.5 94.6 92.9 92.9 93.3  PLT 213 189 189 188 811   Basic Metabolic Panel: Recent Labs  Lab 11/12/21 0418 11/13/21 0320 11/14/21 0330 11/16/21 0511 11/17/21 0539 11/18/21 0332  NA 132* 130* 132* 129* 127* 131*  K 4.6 4.5 4.4 4.4 4.1 3.6  CL 103 105 104 100 99 98  CO2 18* 20* 21* 22 21* 24  GLUCOSE 116* 132* 107* 114* 119* 119*  BUN 24* '23 19 21 '$ 24* 22  CREATININE 0.84 0.81 0.75 0.91 0.67 0.72  CALCIUM 7.1* 6.9* 6.8* 6.4* 6.7* 6.9*  MG 2.2  --   --   --   --   --   PHOS 3.1  --   --   --  2.7 2.7   GFR: Estimated Creatinine Clearance: 51.8 mL/min (by C-G formula based on SCr of 0.72 mg/dL). Liver Function Tests: Recent Labs  Lab 11/12/21 0418 11/16/21 0511 11/17/21 0539 11/18/21 0332  AST 17 21  --   --   ALT 18 18  --   --  ALKPHOS 84 98  --   --   BILITOT 1.1 0.6  --   --   PROT 3.9* 3.7*  --   --   ALBUMIN 1.6* <1.5* <1.5* 2.2*   No results for input(s): "LIPASE", "AMYLASE" in the last 168 hours. No results for input(s): "AMMONIA" in the last 168 hours. Coagulation Profile: No results for input(s): "INR", "PROTIME" in the last 168 hours. Cardiac Enzymes: No results for input(s): "CKTOTAL", "CKMB", "CKMBINDEX", "TROPONINI" in the last 168 hours. BNP (last 3 results) No results for input(s): "PROBNP" in the last 8760 hours. HbA1C: No results for input(s): "HGBA1C" in the last 72 hours. CBG: No results for input(s): "GLUCAP" in the last 168 hours. Lipid Profile: No results for input(s): "CHOL", "HDL", "LDLCALC", "TRIG", "CHOLHDL", "LDLDIRECT" in the last 72 hours. Thyroid Function Tests: No results for input(s): "TSH", "T4TOTAL", "FREET4", "T3FREE", "THYROIDAB" in the last 72 hours. Anemia Panel: No results  for input(s): "VITAMINB12", "FOLATE", "FERRITIN", "TIBC", "IRON", "RETICCTPCT" in the last 72 hours. Sepsis Labs: No results for input(s): "PROCALCITON", "LATICACIDVEN" in the last 168 hours.  Recent Results (from the past 240 hour(s))  Culture, blood (Routine X 2) w Reflex to ID Panel     Status: None   Collection Time: 11/10/21  9:35 AM   Specimen: BLOOD  Result Value Ref Range Status   Specimen Description BLOOD RIGHT ASSIST CONTROL  Final   Special Requests   Final    BOTTLES DRAWN AEROBIC AND ANAEROBIC Blood Culture adequate volume   Culture   Final    NO GROWTH 5 DAYS Performed at [facility], [address], [place], [place] [address]    Report Status 11/15/2021 FINAL  Final  Culture, blood (Routine X 2) w Reflex to ID Panel     Status: None   Collection Time: 11/10/21  9:35 AM   Specimen: BLOOD  Result Value Ref Range Status   Specimen Description BLOOD RIGHT HAND  Final   Special Requests   Final    BOTTLES DRAWN AEROBIC AND ANAEROBIC Blood Culture adequate volume   Culture   Final    NO GROWTH 5 DAYS Performed at [facility], [address], [place], [place] [address]    Report Status 11/15/2021 FINAL  Final  Urine Culture     Status: Abnormal   Collection Time: 11/10/21  9:40 AM   Specimen: Urine, Clean Catch  Result Value Ref Range Status   Specimen Description   Final    URINE, CLEAN CATCH Performed at [facility], [address], [place], [place] [address]    Special Requests   Final    NONE Performed at [facility], [address] [place], [place] [address]    Culture (A)  Final    20,000 COLONIES/mL ESCHERICHIA COLI 30,000 COLONIES/mL ENTEROCOCCUS FAECALIS    Report Status 11/14/2021 FINAL  Final   Organism ID, Bacteria ESCHERICHIA COLI (A)  Final   Organism ID, Bacteria ENTEROCOCCUS FAECALIS (A)  Final      Susceptibility   Escherichia coli - MIC*    AMPICILLIN <=2 SENSITIVE Sensitive     CEFAZOLIN <=4 SENSITIVE Sensitive     CEFEPIME <=0.12  SENSITIVE Sensitive     CEFTRIAXONE <=0.25 SENSITIVE Sensitive     CIPROFLOXACIN <=0.25 SENSITIVE Sensitive     GENTAMICIN <=1 SENSITIVE Sensitive     IMIPENEM <=0.25 SENSITIVE Sensitive     NITROFURANTOIN <=16 SENSITIVE Sensitive     TRIMETH/SULFA <=20 SENSITIVE Sensitive     AMPICILLIN/SULBACTAM <=  2 SENSITIVE Sensitive     PIP/TAZO <=4 SENSITIVE Sensitive     * 20,000 COLONIES/mL ESCHERICHIA COLI   Enterococcus faecalis - MIC*    AMPICILLIN <=2 SENSITIVE Sensitive     NITROFURANTOIN <=16 SENSITIVE Sensitive     VANCOMYCIN 1 SENSITIVE Sensitive     * 30,000 COLONIES/mL ENTEROCOCCUS FAECALIS  C Difficile Quick Screen (NO PCR Reflex)     Status: None   Collection Time: 11/12/21 11:00 AM   Specimen: STOOL  Result Value Ref Range Status   C Diff antigen NEGATIVE NEGATIVE Final   C Diff toxin NEGATIVE NEGATIVE Final   C Diff interpretation No C. difficile detected.  Final    Comment: Performed at [facility], [address], [place], [place] [address]         Radiology Studies: No results found.      Scheduled Meds:  ascorbic acid  500 mg Oral BID   cholestyramine  4 g Oral Daily   dicyclomine  10 mg Oral TID   diphenoxylate-atropine  2 tablet Oral Q6H   feeding supplement  1 Container Oral TID BM   Fluticasone Furoate  1 Inhalation Inhalation Daily   furosemide  20 mg Oral Daily   heparin  5,000 Units Subcutaneous Q8H   loperamide  4 mg Oral TID   megestrol  400 mg Oral BID   multivitamin with minerals  1 tablet Oral Daily   octreotide  200 mcg Subcutaneous TID   pantoprazole  40 mg Oral Daily   polycarbophil  625 mg Oral BID   sodium bicarbonate  650 mg Oral BID   zinc sulfate  220 mg Oral Daily     LOS: 19 days    Time spent: 35 minutes    Haliegh Khurana Darleen Crocker, DO [facility]  If 7PM-7AM, please contact night-coverage [email or web address] 11/18/2021, 1:02 PM

## 2021-11-18 NOTE — Progress Notes (Signed)
Palliative:  HPI:  76 y.o. female  with past medical history of colorectal cancer with concurrent chemoradiation, last treatment approximately 2 weeks ago, completed 19 sessions, HTN/HLD, GERD, anemia, asthma, admitted on 10/30/2021 with intractable diarrhea likely secondary to radiation therapy and resultant radiation enteritis.   I met today after reviewing records and discussing with RN. Ms. Lavey is lying in bed. She is weak and frail. She expresses some frustration and discouragement that she is not really improving. She reports that she did feel like her diarrhea was improving but then felt it was a little worse yesterday. She tells me that her doctors keep telling her to wait for the side effects to improve from her treatment but she is frustrated that she is 3 weeks in and is still feeling very badly. She reports that she is trying to have intake and trying to move. She says it is difficult to sit in chair as her back hurts in the recliner. She reports that she is still willing to try.   Ms. Furgeson shares with me about caring for her husband who had Parkinson's and he died ~2 years ago. She reports that she cared with him for many months where he had chronic diarrhea. She shares that her health has declined since he died. He was cared for in [facility] at the end of his life. We reviewed how difficult these decisions are and how difficult it is to make decisions for hospice but also because "hospice" is such a scary word people tend to use this help too late and not get the full benefits from that support.   I offered listening and support. I reassured Ms. Guinn that I will continue to check on her and support her in any way that I am able.   I called and was able to speak with son, [name]. [name] and I had a long discussion regarding his mother's cancer, symptoms, and progression while hospitalized. [name] shares that she was able to eat most of a baked potato that he brought her and  is just now beginning to ask for foods that she may want. He knows that her nutrition is a significant issue because she has had poor nutrition since mid August (she had significant nausea and difficulty keeping food down). He understands that the longer her symptoms persist the more this takes out of her overall. We discussed diet and recommended foods. We discussed the difficult question of surgery and if she will be strong enough to proceed. [name] is fully aware of his mother's tenuous state and although he is hopeful for improvement with time he understands there are many unknowns. We also discussed resuscitation and after discussion he does believe that his mother would desire DNR status but continued treatment to try and improve. [name] will be here tomorrow ~1130 and we will discuss and clarify this together.   All questions/concerns addressed. Emotional support provided.   Exam: Alert but significant weakness and fatigue. No distress. Breathing regular, unlabored. Abd soft. Moves all extremities.   Plans: - Ongoing goals of care discussions and support.  - Hopes to clarify code status desire tomorrow.  - Watchful waiting.   [place], NP Palliative Medicine Team Pager [phone] (Please see [email or web address] for schedule) Team Phone [phone]    Greater than 50%  of this time was spent counseling and coordinating care related to the above assessment and plan

## 2021-11-19 DIAGNOSIS — C189 Malignant neoplasm of colon, unspecified: Secondary | ICD-10-CM | POA: Diagnosis not present

## 2021-11-19 DIAGNOSIS — R197 Diarrhea, unspecified: Secondary | ICD-10-CM | POA: Diagnosis not present

## 2021-11-19 DIAGNOSIS — Z515 Encounter for palliative care: Secondary | ICD-10-CM | POA: Diagnosis not present

## 2021-11-19 DIAGNOSIS — Z7189 Other specified counseling: Secondary | ICD-10-CM | POA: Diagnosis not present

## 2021-11-19 LAB — CBC
HCT: 30.3 % — ABNORMAL LOW (ref 36.0–46.0)
Hemoglobin: 10.5 g/dL — ABNORMAL LOW (ref 12.0–15.0)
MCH: 31.8 pg (ref 26.0–34.0)
MCHC: 34.7 g/dL (ref 30.0–36.0)
MCV: 91.8 fL (ref 80.0–100.0)
Platelets: 183 10*3/uL (ref 150–400)
RBC: 3.3 MIL/uL — ABNORMAL LOW (ref 3.87–5.11)
RDW: 24.4 % — ABNORMAL HIGH (ref 11.5–15.5)
WBC: 10.4 10*3/uL (ref 4.0–10.5)
nRBC: 0 % (ref 0.0–0.2)

## 2021-11-19 LAB — BASIC METABOLIC PANEL
Anion gap: 12 (ref 5–15)
BUN: 17 mg/dL (ref 8–23)
CO2: 22 mmol/L (ref 22–32)
Calcium: 6.7 mg/dL — ABNORMAL LOW (ref 8.9–10.3)
Chloride: 98 mmol/L (ref 98–111)
Creatinine, Ser: 0.56 mg/dL (ref 0.44–1.00)
GFR, Estimated: >60 mL/min (ref >60)
Glucose, Bld: 82 mg/dL (ref 70–99)
Potassium: 3.4 mmol/L — ABNORMAL LOW (ref 3.5–5.1)
Sodium: 132 mmol/L — ABNORMAL LOW (ref 135–145)

## 2021-11-19 LAB — MAGNESIUM: Magnesium: 2.1 mg/dL (ref 1.7–2.4)

## 2021-11-19 MED ORDER — POTASSIUM CHLORIDE CRYS ER 20 MEQ PO TBCR
40.0000 meq | EXTENDED_RELEASE_TABLET | Freq: Once | ORAL | Status: AC
  Administered 2021-11-19: 40 meq via ORAL
  Filled 2021-11-19: qty 2

## 2021-11-19 MED ORDER — MIRTAZAPINE 15 MG PO TABS
7.5000 mg | ORAL_TABLET | Freq: Every day | ORAL | Status: DC
  Administered 2021-11-19: 7.5 mg via ORAL
  Filled 2021-11-19: qty 1

## 2021-11-19 NOTE — Progress Notes (Signed)
PROGRESS NOTE    Regina Knox  JKD:[id number] DOB: 09/29/1945 DOA: 10/30/2021 PCP: Chevis Pretty, FNP   Brief Narrative:    Patient is a 76 year old female with history of anemia, asthma, hyperlipidemia, GERD, hypertension, colorectal cancer who presented with intractable diarrhea secondary to recent radiation therapy, weakness.  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  Oncology, general surgery, palliative care following.  Hospital course remarkable for persistent diarrhea, weakness.  Overall condition slowly improving, diuretics resumed.  Still has poor oral intake.  On Lomotil, Imodium, Questran, subcutaneous octreotide.  Ultimate plan is to discharge her to skilled nursing  facility, but palliative discussions are pending.  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  -WBC is down to 10.9 from 14.5 on 11/11/21  - C. difficile, GI pathogen panel negative.   -Continue Lomotil, questran, Imodium and Octreotide.   -Diarrhea has been persistent - will add fiber to help bulk stools -Consider stopping octreotide once diarrhea improves further -GI consult appreciated -Recent CT abdomen/pelvis showed circumferential wall thickening, mild inflammation of the rectosigmoid colon, air-fluid level throughout the remaining colon, multiple small bowel loops in the pelvis-findings consistent with enteritis and proctocolitis -Diet has been advanced   Chemo and malignancy induced anorexia/FTT--- started Megace for appetite stimulation -Nutritional supplements as ordered    Vaginal bleeding---??  Radiation vaginitis -Hgb stable =-Outpatient follow-up with GYN advised   History of rectal cancer: Patient was on Xeloda and was receiving radiation treatment.  Patient is not interested on continuing chemotherapy. -  Patient following with surgery to discuss about surgical options -Hgb stable   COPD: Currently stable.  Continue bronchodilators as needed   Hypokalemia: Supplemented, will follow in am   -Pseudo hypocalcemia--- when corrected for very low albumin, calcium is actually close to normal   Hyponatremia: Suspect dehydration related in the setting of GI losses and poor oral intake -Maintain adequate hydration   Non-anion gap metabolic disease: Likely due to loss of bicarb through diarrhea. -Improving with sodium bicarb tablets   Hypertension: Continue to hold olmesartan and  hydrochlorothiazide as BP is soft and patient had significant GI losses/diarrhea   Peripheral edema :   hypoalbuminemia.  -Started on albumin infusions and Lasix   Normocytic anemia: Currently hemoglobin stable   Weakness/debility:/Dispo PT/OT saw her, recommended SNF on discharge   Goals of care: Palliative care also following with further discussions to be had today.  Current plan is to continue full scope of treatment, full code, outpatient follow-up with palliative services with hospice of [facility].   Nutrition Problem: Increased nutrient needs Etiology: acute illness, cancer and cancer related treatments, chronic illness        Pressure Injury 11/12/21 Buttocks Right;Medial Stage 2 -  Partial thickness loss of dermis presenting as a shallow open injury with a red, pink wound bed without slough. 1cm x 1cm x 0cm (Active)  11/12/21 1000  Location: Buttocks  Location Orientation: Right;Medial  Staging: Stage 2 -  Partial thickness loss of dermis presenting as a shallow open injury with a red, pink wound bed without slough.  Wound Description (Comments): 1cm x 1cm  x 0cm  Present on Admission: No  Dressing Type Foam - Lift dressing to assess site every shift 11/15/21 1945      DVT prophylaxis:Place TED hose Start: 11/06/21 1034 heparin injection 5,000 Units Start: 10/30/21 2215 SCDs Start: 10/30/21 2209       Code Status: Full Code   Family Communication: No family present today   patient status:Inpatient   Patient is from :Home   Anticipated discharge to:SNF pending insurance authorization   Estimated DC date: Plan to discharge once diarrhea is better controlled   Consultants: GI, general surgery, palliative care   Procedures: None    Antimicrobials:  Anti-infectives (From admission, onward)    Start     Dose/Rate Route Frequency Ordered Stop   11/16/21 1645  amoxicillin (AMOXIL) capsule 500 mg  Status:  Discontinued        500 mg Oral Every 8 hours 11/16/21 1547 11/17/21 1943       Subjective: Patient seen and evaluated today with ongoing poor oral intake and ongoing frequent bowel movements.  Objective: Vitals:   11/18/21 1619 11/18/21 2122 11/19/21 0402 11/19/21 0500  BP: (!) 96/58 (!) 92/54 (!) 92/57   Pulse: 90 95 95   Resp: '16 14 14   '$ Temp: 98.8 F (37.1 C) (!) 97.1 F (36.2 C) (!) 97 F (36.1 C)   TempSrc:      SpO2: 98% 98% 99%   Weight:    65 kg  Height:        Intake/Output Summary (Last 24 hours) at 11/19/2021 1106 Last data filed at 11/19/2021 0900 Gross per 24 hour  Intake 300 ml  Output --  Net 300 ml   Filed Weights   11/17/21 0641 11/18/21 0500 11/19/21 0500  Weight: 65.3 kg 63 kg 65 kg    Examination:  General exam: Appears calm and comfortable  Respiratory system: Clear to auscultation. Respiratory effort normal. Cardiovascular system: S1 & S2 heard, RRR.  Gastrointestinal system: Abdomen is soft Central nervous system: Alert and awake Extremities: No edema Skin: No significant lesions noted Psychiatry: Flat affect.    Data Reviewed: I have personally reviewed following labs and  imaging studies  CBC: Recent Labs  Lab 11/13/21 0320 11/15/21 0830 11/16/21 0511 11/18/21 0332 11/19/21 0553  WBC 8.2 11.2* 10.9* 9.4 10.4  NEUTROABS  --  9.3*  --   --   --   HGB 11.2* 12.3 11.6* 10.0* 10.5*  HCT 33.5* 35.3* 34.0* 29.4* 30.3*  MCV 94.6 92.9 92.9 93.3 91.8  PLT 189 189 188 183 417   Basic Metabolic Panel: Recent Labs  Lab 11/14/21 0330 11/16/21 0511 11/17/21 0539 11/18/21 0332 11/19/21 0339  NA 132* 129* 127* 131* 132*  K 4.4 4.4 4.1 3.6 3.4*  CL 104 100 99 98 98  CO2 21* 22 21* 24 22  GLUCOSE 107* 114* 119* 119* 82  BUN 19 21 24* 22 17  CREATININE 0.75 0.91 0.67 0.72 0.56  CALCIUM 6.8* 6.4* 6.7* 6.9* 6.7*  MG  --   --   --   --  2.1  PHOS  --   --  2.7 2.7  --    GFR: Estimated Creatinine Clearance: 52.5 mL/min (by C-G formula based on SCr of 0.56 mg/dL). Liver Function Tests: Recent Labs  Lab 11/16/21 0511 11/17/21 0539 11/18/21 0332  AST 21  --   --   ALT 18  --   --   ALKPHOS 98  --   --  BILITOT 0.6  --   --   PROT 3.7*  --   --   ALBUMIN <1.5* <1.5* 2.2*   No results for input(s): "LIPASE", "AMYLASE" in the last 168 hours. No results for input(s): "AMMONIA" in the last 168 hours. Coagulation Profile: No results for input(s): "INR", "PROTIME" in the last 168 hours. Cardiac Enzymes: No results for input(s): "CKTOTAL", "CKMB", "CKMBINDEX", "TROPONINI" in the last 168 hours. BNP (last 3 results) No results for input(s): "PROBNP" in the last 8760 hours. HbA1C: No results for input(s): "HGBA1C" in the last 72 hours. CBG: No results for input(s): "GLUCAP" in the last 168 hours. Lipid Profile: No results for input(s): "CHOL", "HDL", "LDLCALC", "TRIG", "CHOLHDL", "LDLDIRECT" in the last 72 hours. Thyroid Function Tests: No results for input(s): "TSH", "T4TOTAL", "FREET4", "T3FREE", "THYROIDAB" in the last 72 hours. Anemia Panel: No results for input(s): "VITAMINB12", "FOLATE", "FERRITIN", "TIBC", "IRON", "RETICCTPCT" in the last 72  hours. Sepsis Labs: No results for input(s): "PROCALCITON", "LATICACIDVEN" in the last 168 hours.  Recent Results (from the past 240 hour(s))  Culture, blood (Routine X 2) w Reflex to ID Panel     Status: None   Collection Time: 11/10/21  9:35 AM   Specimen: BLOOD  Result Value Ref Range Status   Specimen Description BLOOD RIGHT ASSIST CONTROL  Final   Special Requests   Final    BOTTLES DRAWN AEROBIC AND ANAEROBIC Blood Culture adequate volume   Culture   Final    NO GROWTH 5 DAYS Performed at [facility], [address], [place], [place] [address]    Report Status 11/15/2021 FINAL  Final  Culture, blood (Routine X 2) w Reflex to ID Panel     Status: None   Collection Time: 11/10/21  9:35 AM   Specimen: BLOOD  Result Value Ref Range Status   Specimen Description BLOOD RIGHT HAND  Final   Special Requests   Final    BOTTLES DRAWN AEROBIC AND ANAEROBIC Blood Culture adequate volume   Culture   Final    NO GROWTH 5 DAYS Performed at [facility], [address], [place], [place] [address]    Report Status 11/15/2021 FINAL  Final  Urine Culture     Status: Abnormal   Collection Time: 11/10/21  9:40 AM   Specimen: Urine, Clean Catch  Result Value Ref Range Status   Specimen Description   Final    URINE, CLEAN CATCH Performed at [facility], [address], [place], [place] [address]    Special Requests   Final    NONE Performed at [facility], [address] [place],  [address]    Culture (A)  Final    20,000 COLONIES/mL ESCHERICHIA COLI 30,000 COLONIES/mL ENTEROCOCCUS FAECALIS    Report Status 11/14/2021 FINAL  Final   Organism ID, Bacteria ESCHERICHIA COLI (A)  Final   Organism ID, Bacteria ENTEROCOCCUS FAECALIS (A)  Final      Susceptibility   Escherichia coli - MIC*    AMPICILLIN <=2 SENSITIVE Sensitive     CEFAZOLIN <=4 SENSITIVE Sensitive     CEFEPIME <=0.12 SENSITIVE Sensitive     CEFTRIAXONE <=0.25 SENSITIVE Sensitive     CIPROFLOXACIN <=0.25 SENSITIVE  Sensitive     GENTAMICIN <=1 SENSITIVE Sensitive     IMIPENEM <=0.25 SENSITIVE Sensitive     NITROFURANTOIN <=16 SENSITIVE Sensitive     TRIMETH/SULFA <=20 SENSITIVE Sensitive     AMPICILLIN/SULBACTAM <=2 SENSITIVE Sensitive     PIP/TAZO <=4 SENSITIVE Sensitive     *  20,000 COLONIES/mL ESCHERICHIA COLI   Enterococcus faecalis - MIC*    AMPICILLIN <=2 SENSITIVE Sensitive     NITROFURANTOIN <=16 SENSITIVE Sensitive     VANCOMYCIN 1 SENSITIVE Sensitive     * 30,000 COLONIES/mL ENTEROCOCCUS FAECALIS  C Difficile Quick Screen (NO PCR Reflex)     Status: None   Collection Time: 11/12/21 11:00 AM   Specimen: STOOL  Result Value Ref Range Status   C Diff antigen NEGATIVE NEGATIVE Final   C Diff toxin NEGATIVE NEGATIVE Final   C Diff interpretation No C. difficile detected.  Final    Comment: Performed at [facility], [address] [place], [place] [address]         Radiology Studies: No results found.      Scheduled Meds:  ascorbic acid  500 mg Oral BID   cholestyramine  4 g Oral Daily   dicyclomine  10 mg Oral TID   diphenoxylate-atropine  2 tablet Oral Q6H   feeding supplement  1 Container Oral TID BM   Fluticasone Furoate  1 Inhalation Inhalation Daily   heparin  5,000 Units Subcutaneous Q8H   loperamide  4 mg Oral TID   megestrol  400 mg Oral BID   multivitamin with minerals  1 tablet Oral Daily   octreotide  200 mcg Subcutaneous TID   pantoprazole  40 mg Oral Daily   polycarbophil  625 mg Oral BID   sodium bicarbonate  650 mg Oral BID   zinc sulfate  220 mg Oral Daily     LOS: 20 days    Time spent: 35 minutes    Chrstopher Malenfant Darleen Crocker, DO [facility]  If 7PM-7AM, please contact night-coverage [email or web address] 11/19/2021, 11:06 AM

## 2021-11-19 NOTE — Progress Notes (Signed)
Physical Therapy Treatment Patient Details Name: Regina Knox MRN: [id number] DOB: Nov 14, 1945 Today's Date: 11/19/2021   History of Present Illness Regina Knox is a 76 y.o. female with medical history significant of anemia, asthma, hyperlipidemia, GERD, hypertension, colorectal cancer currently getting radiation, and more presents to the ED with a chief complaint of intractable diarrhea.  Patient reports that she has had diarrhea for 2 weeks.  Its associated with the radiation, which she started 3 weeks ago for colorectal cancer.  Patient reports that since that time she has had 7-8 watery bowel movements per day.  They are nonbloody.  She does have associated abdominal cramping that is relieved after she has a bowel movement.  Patient reports that anything she eats or drinks seems to go straight through her and she has diarrhea immediately after.  She reports it is even noticeable with the IV fluids.  She has taken Lomotil and Imodium with no relief at home.  She had nausea and vomiting x1 that was nonbloody emesis.  Her last normal meal was day prior to presentation.  She has had a decreased appetite.  Patient does report some dysuria that she attributes to dehydration.  She had some generalized weakness which has been expected with radiation.  Patient has no other complaints at this time.    PT Comments    Patient demonstrates labored movement for bed mobility requiring mod assist for supine to sit transfer due to weakness with patient able to complete a few LE exercises at EOB but limited by fair seated balance and decreased strength. Patient unable to stand requiring max assist and repeated attempts with use of RW. Patient put back in bed with LPN in room. Patient will benefit from continued skilled physical therapy in hospital and recommended venue below to increase strength, balance, endurance for safe ADLs and gait.    Recommendations for follow up therapy are one component of a  multi-disciplinary discharge planning process, led by the attending physician.  Recommendations may be updated based on patient status, additional functional criteria and insurance authorization.  Follow Up Recommendations  Skilled nursing-short term rehab (<3 hours/day) Can patient physically be transported by private vehicle: No   Assistance Recommended at Discharge Set up Supervision/Assistance  Patient can return home with the following A lot of help with bathing/dressing/bathroom;A lot of help with walking and/or transfers;Help with stairs or ramp for entrance;Assistance with cooking/housework   Equipment Recommendations  None recommended by PT    Recommendations for Other Services       Precautions / Restrictions Precautions Precautions: Fall Restrictions Weight Bearing Restrictions: No     Mobility  Bed Mobility Overal bed mobility: Needs Assistance Bed Mobility: Supine to Sit, Sit to Supine     Supine to sit: Mod assist, HOB elevated Sit to supine: Mod assist, Max assist   General bed mobility comments: increased time, labored movement, requires assist for elevating trunk and scooting EOB for supine to sit transfer, requires max assist for sit to supine with poor control and significant weakness    Transfers Overall transfer level: Needs assistance Equipment used: Rolling walker (2 wheels) Transfers: Sit to/from Stand Sit to Stand: Max assist           General transfer comment: Patient unable to stand fully due to significant weakness, required max assist to get to squatting position before having to sit with use of RW    Ambulation/Gait  Stairs             Wheelchair Mobility    Modified Rankin (Stroke Patients Only)       Balance Overall balance assessment: Needs assistance Sitting-balance support: Feet supported, Bilateral upper extremity supported Sitting balance-Leahy Scale: Poor Sitting balance - Comments:  poor seated EOB with poor trunk control and occasional posterior loss of balance Postural control: Posterior lean Standing balance support: Bilateral upper extremity supported, Reliant on assistive device for balance, During functional activity Standing balance-Leahy Scale: Zero Standing balance comment: patient unable to stand by herself, requires max assist                            Cognition Arousal/Alertness: Awake/alert Behavior During Therapy: WFL for tasks assessed/performed Overall Cognitive Status: Within Functional Limits for tasks assessed                                          Exercises General Exercises - Lower Extremity Long Arc Quad: Seated, AROM, Strengthening, Both, 10 reps Hip Flexion/Marching: Seated, Strengthening, Both, 5 reps, AAROM Toe Raises: Seated, AROM, Strengthening, Both, 10 reps Heel Raises: Seated, AROM, Strengthening, Both, 10 reps    General Comments        Pertinent Vitals/Pain Pain Assessment Pain Assessment: No/denies pain    Home Living                          Prior Function            PT Goals (current goals can now be found in the care plan section) Acute Rehab PT Goals Patient Stated Goal: return home after rehab PT Goal Formulation: With patient Time For Goal Achievement: 12/03/21 Potential to Achieve Goals: Fair Progress towards PT goals: Progressing toward goals    Frequency    Min 3X/week      PT Plan Current plan remains appropriate    Co-evaluation              AM-PAC PT "6 Clicks" Mobility   Outcome Measure  Help needed turning from your back to your side while in a flat bed without using bedrails?: A Lot Help needed moving from lying on your back to sitting on the side of a flat bed without using bedrails?: A Lot Help needed moving to and from a bed to a chair (including a wheelchair)?: A Lot Help needed standing up from a chair using your arms (e.g.,  wheelchair or bedside chair)?: A Lot Help needed to walk in hospital room?: Total Help needed climbing 3-5 steps with a railing? : Total 6 Click Score: 10    End of Session   Activity Tolerance: Patient limited by fatigue;Patient tolerated treatment well Patient left: in bed;with call bell/phone within reach Nurse Communication: Mobility status PT Visit Diagnosis: Unsteadiness on feet (R26.81);Other abnormalities of gait and mobility (R26.89);Muscle weakness (generalized) (M62.81)     Time: 3299-2426 PT Time Calculation (min) (ACUTE ONLY): 26 min  Charges:  $Therapeutic Exercise: 8-22 mins $Therapeutic Activity: 8-22 mins                     Zigmund Gottron, SPT

## 2021-11-19 NOTE — Progress Notes (Signed)
Palliative:  HPI:  76 y.o. female  with past medical history of colorectal cancer with concurrent chemoradiation, last treatment approximately 2 weeks ago, completed 19 sessions, HTN/HLD, GERD, anemia, asthma, admitted on 10/30/2021 with intractable diarrhea likely secondary to radiation therapy and resultant radiation enteritis.    I met today with Ms. Leabo and son, [name], at bedside. Ms. Shimada is very lethargic and weak today. She worked with therapy recently and shares that she was too weak to stand. We discussed that nutrition is a significant barrier to her ability to tolerate activity and without adequate nutrition (especially with ongoing diarrhea - although this has slowed significantly) she unfortunately will become more weak. We discussed the reality of what even 24 hours in bed can do and and the longer the poor nutrition and weakness the more difficult it is to have improvement. Ms. Carachure and [name] both express their desire to continue efforts with nutrition and therapy with hopes of having even slow improvement.   I discussed with Ms. Carley and Ute code status. I shared with them the realities of what this means and looks like. I express my concern that if she were to decline to require resuscitation that we would be in a much worse situation than we are currently and those interventions would be worse than what she has experienced during this hospitalization. I asked her to consider what she is willing to go through if the outcome is expected to be poor. After some consideration Ms. Morado shares "I don't want to go through more." They both agree with DNR status but continuing all other measures to prevent her from declining to that point.   We also discussed her spirits and depression. I offered consideration of antidepressant. [name] reports that she has required Xanax to help her sleep at times. Ms. Skillman is hesitant to consider antidepressant. I discussed with them  consideration of mirtazapine which has properties of antidepressant, sleep aide, and appetite aide. They both agree that they would like to try this. I was very clear that this is a trial and this helps some people more than others but may be worth trying and if not beneficial we can always consider stopping.   All questions/concerns addressed. Emotional support provided. Updated Dr. Manuella Ghazi.   Exam: Lethargic. Significant weakness. Able to awaken and answer questions. Flat affect. Breathing regular, unlabored. Abd flat. Moves all extremities. BLE edema.   Plan: - DNR decided. Full scope desired otherwise.  - Trial of mirtazapine 7.5 po qhs for depression/appetite.  - Ongoing palliative support.   [place], NP Palliative Medicine Team Pager [phone] (Please see [email or web address] for schedule) Team Phone [phone]    Greater than 50%  of this time was spent counseling and coordinating care related to the above assessment and plan

## 2021-11-20 DIAGNOSIS — Z7189 Other specified counseling: Secondary | ICD-10-CM | POA: Diagnosis not present

## 2021-11-20 DIAGNOSIS — Z515 Encounter for palliative care: Secondary | ICD-10-CM | POA: Diagnosis not present

## 2021-11-20 DIAGNOSIS — R197 Diarrhea, unspecified: Secondary | ICD-10-CM | POA: Diagnosis not present

## 2021-11-20 DIAGNOSIS — C189 Malignant neoplasm of colon, unspecified: Secondary | ICD-10-CM | POA: Diagnosis not present

## 2021-11-20 LAB — BASIC METABOLIC PANEL
Anion gap: 9 (ref 5–15)
BUN: 20 mg/dL (ref 8–23)
CO2: 25 mmol/L (ref 22–32)
Calcium: 6.9 mg/dL — ABNORMAL LOW (ref 8.9–10.3)
Chloride: 97 mmol/L — ABNORMAL LOW (ref 98–111)
Creatinine, Ser: 0.59 mg/dL (ref 0.44–1.00)
GFR, Estimated: >60 mL/min (ref >60)
Glucose, Bld: 101 mg/dL — ABNORMAL HIGH (ref 70–99)
Potassium: 4 mmol/L (ref 3.5–5.1)
Sodium: 131 mmol/L — ABNORMAL LOW (ref 135–145)

## 2021-11-20 LAB — MAGNESIUM: Magnesium: 2.1 mg/dL (ref 1.7–2.4)

## 2021-11-20 NOTE — Progress Notes (Signed)
Pt turned yellow MEWS due to low b/p, MD aware. No new orders at this time. Will continue to monitor.

## 2021-11-20 NOTE — Progress Notes (Signed)
Palliative:  HPI:  76 y.o. female  with past medical history of colorectal cancer with concurrent chemoradiation, last treatment approximately 2 weeks ago, completed 19 sessions, HTN/HLD, GERD, anemia, asthma, admitted on 10/30/2021 with intractable diarrhea likely secondary to radiation therapy and resultant radiation enteritis.    I met today with Regina Knox again today - son not yet to bedside. Regina Knox is weak and fatigued - not much change from past few days. I asked her if medication last night has made her more sleepy today and she shares it is difficult to tell but she does report she slept better. Appetite not any better and she has not really had anything much today. She has not questions or concerns and does not have much energy for further discussion at this time.   I returned again but no family at bedside. I reached out to son [name] and left voicemail. I do worry about path forward and lack of progress in status despite improvement in diarrhea symptoms. I am very concerned that Regina Knox continues to get worse and further away from being able to pursue surgical intervention to control her cancer. I worry about where we go from here.   All questions/concerns addressed. Emotional support provided.   Exam: Lethargic. Awakens and oriented but very poor reserve to remain awake. Breathing regular, unlabored. Abd soft. Moves all extremities. Significant weakness and fatigue.   Plan: - Will need ongoing goals of care conversation. She is severely debilitated at this stage. Prognosis is poor.  - I will follow up on my return Monday.   25 min  Vinie Sill, NP Palliative Medicine Team Pager [phone] (Please see [email or web address] for schedule) Team Phone [phone]    Greater than 50%  of this time was spent counseling and coordinating care related to the above assessment and plan

## 2021-11-20 NOTE — Progress Notes (Signed)
PROGRESS NOTE    Regina Knox  KAJ:[id number] DOB: March 03, 1945 DOA: 10/30/2021 PCP: Chevis Pretty, FNP   Brief Narrative:    Patient is a 76 year old female with history of anemia, asthma, hyperlipidemia, GERD, hypertension, colorectal cancer who presented with intractable diarrhea secondary to recent radiation therapy, weakness.  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  Oncology, general surgery, palliative care following.  Hospital course remarkable for persistent diarrhea, weakness.  Overall condition slowly improving, diuretics resumed.  Still has poor oral intake.  On Lomotil, Imodium, Questran, subcutaneous octreotide.  Ultimate plan is to discharge her to skilled nursing  facility, but palliative discussions are pending.  Possibly will require home with palliative services.  Started on mirtazapine 10/4.  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  -WBC is down to 10.9 from 14.5 on 11/11/21  - C. difficile, GI pathogen panel negative.   -Continue Lomotil, questran, Imodium and Octreotide.   -Diarrhea has been persistent - will add fiber to help bulk stools -Consider stopping octreotide once diarrhea improves further -GI consult appreciated -Recent CT abdomen/pelvis showed circumferential wall thickening, mild inflammation of the rectosigmoid colon, air-fluid level throughout the remaining colon, multiple small bowel loops in the pelvis-findings consistent with enteritis and proctocolitis -Diet has been advanced   Chemo and malignancy induced anorexia/FTT---  started Megace for appetite stimulation -Nutritional supplements as ordered -Started on mirtazapine 10/4   Vaginal bleeding---??  Radiation vaginitis -Hgb stable =-Outpatient follow-up with GYN advised   History of rectal cancer: Patient was on Xeloda and was receiving radiation treatment.  Patient is not interested on continuing chemotherapy. -  Patient following with surgery to discuss about surgical options -Hgb stable   COPD: Currently stable.  Continue bronchodilators as needed   Hypokalemia: Supplemented, will follow in am   -Pseudo hypocalcemia--- when corrected for very low albumin, calcium is actually close to normal   Hyponatremia: Suspect dehydration related in the setting of GI losses and poor oral intake -Maintain adequate hydration   Non-anion gap metabolic disease: Likely due to loss of bicarb through diarrhea. -Improving with sodium bicarb tablets   Hypertension: Continue to hold olmesartan and  hydrochlorothiazide as BP is soft and patient had significant GI losses/diarrhea   Peripheral edema :   hypoalbuminemia.  -Started on albumin infusions and Lasix   Normocytic anemia: Currently hemoglobin stable   Weakness/debility:/Dispo PT/OT saw her, recommended SNF on discharge   Goals of care: Palliative care also following with further discussions to be had today.  Current plan is to continue full scope of treatment, full code, outpatient follow-up with palliative services with hospice of [facility].   Nutrition Problem: Increased nutrient needs Etiology: acute illness, cancer and cancer related treatments, chronic illness        Pressure Injury 11/12/21 Buttocks Right;Medial Stage 2 -  Partial thickness loss of dermis presenting as a shallow open injury with a red, pink wound bed without slough. 1cm x 1cm x 0cm (Active)  11/12/21 1000  Location: Buttocks  Location Orientation: Right;Medial  Staging: Stage 2 -  Partial thickness loss of dermis presenting  as a shallow  open injury with a red, pink wound bed without slough.  Wound Description (Comments): 1cm x 1cm x 0cm  Present on Admission: No  Dressing Type Foam - Lift dressing to assess site every shift 11/15/21 1945      DVT prophylaxis:Place TED hose Start: 11/06/21 1034 heparin injection 5,000 Units Start: 10/30/21 2215 SCDs Start: 10/30/21 2209       Code Status: Full Code   Family Communication: No family present today   patient status:Inpatient   Patient is from :Home   Anticipated discharge to:SNF pending insurance authorization   Estimated DC date: Plan to discharge once diarrhea is better controlled   Consultants: GI, general surgery, palliative care   Procedures: None    Antimicrobials:  Anti-infectives (From admission, onward)    Start     Dose/Rate Route Frequency Ordered Stop   11/16/21 1645  amoxicillin (AMOXIL) capsule 500 mg  Status:  Discontinued        500 mg Oral Every 8 hours 11/16/21 1547 11/17/21 1943       Subjective: Patient seen and evaluated today and appears quite weak and fatigued.  She has not had much oral intake and states that her bowel movement frequency has remained about the same.  Objective: Vitals:   11/19/21 1238 11/19/21 2011 11/20/21 0442 11/20/21 0500  BP: 111/85 103/63 109/67   Pulse: (!) 109 (!) 103 (!) 106   Resp: '18 14 16   '$ Temp: 98.9 F (37.2 C) (!) 97 F (36.1 C) (!) 97 F (36.1 C)   TempSrc: Oral     SpO2: 96% 95% 99%   Weight:    64.8 kg  Height:        Intake/Output Summary (Last 24 hours) at 11/20/2021 1129 Last data filed at 11/20/2021 0700 Gross per 24 hour  Intake 440 ml  Output --  Net 440 ml   Filed Weights   11/18/21 0500 11/19/21 0500 11/20/21 0500  Weight: 63 kg 65 kg 64.8 kg    Examination:  General exam: Appears calm and comfortable  Respiratory system: Clear to auscultation. Respiratory effort normal. Cardiovascular system: S1 & S2 heard, RRR.  Gastrointestinal system: Abdomen is  soft Central nervous system: Alert and awake Extremities: No edema Skin: No significant lesions noted Psychiatry: Flat affect.    Data Reviewed: I have personally reviewed following labs and imaging studies  CBC: Recent Labs  Lab 11/15/21 0830 11/16/21 0511 11/18/21 0332 11/19/21 0553  WBC 11.2* 10.9* 9.4 10.4  NEUTROABS 9.3*  --   --   --   HGB 12.3 11.6* 10.0* 10.5*  HCT 35.3* 34.0* 29.4* 30.3*  MCV 92.9 92.9 93.3 91.8  PLT 189 188 183 937   Basic Metabolic Panel: Recent Labs  Lab 11/16/21 0511 11/17/21 0539 11/18/21 0332 11/19/21 0339 11/20/21 0303  NA 129* 127* 131* 132* 131*  K 4.4 4.1 3.6 3.4* 4.0  CL 100 99 98 98 97*  CO2 22 21* '24 22 25  '$ GLUCOSE 114* 119* 119* 82 101*  BUN 21 24* '22 17 20  '$ CREATININE 0.91 0.67 0.72 0.56 0.59  CALCIUM 6.4* 6.7* 6.9* 6.7* 6.9*  MG  --   --   --  2.1 2.1  PHOS  --  2.7 2.7  --   --    GFR: Estimated Creatinine Clearance: 52.5 mL/min (by C-G formula based on SCr of 0.59 mg/dL). Liver Function Tests: Recent Labs  Lab 11/16/21 0511 11/17/21 0539 11/18/21 0332  AST 21  --   --  ALT 18  --   --   ALKPHOS 98  --   --   BILITOT 0.6  --   --   PROT 3.7*  --   --   ALBUMIN <1.5* <1.5* 2.2*   No results for input(s): "LIPASE", "AMYLASE" in the last 168 hours. No results for input(s): "AMMONIA" in the last 168 hours. Coagulation Profile: No results for input(s): "INR", "PROTIME" in the last 168 hours. Cardiac Enzymes: No results for input(s): "CKTOTAL", "CKMB", "CKMBINDEX", "TROPONINI" in the last 168 hours. BNP (last 3 results) No results for input(s): "PROBNP" in the last 8760 hours. HbA1C: No results for input(s): "HGBA1C" in the last 72 hours. CBG: No results for input(s): "GLUCAP" in the last 168 hours. Lipid Profile: No results for input(s): "CHOL", "HDL", "LDLCALC", "TRIG", "CHOLHDL", "LDLDIRECT" in the last 72 hours. Thyroid Function Tests: No results for input(s): "TSH", "T4TOTAL", "FREET4", "T3FREE",  "THYROIDAB" in the last 72 hours. Anemia Panel: No results for input(s): "VITAMINB12", "FOLATE", "FERRITIN", "TIBC", "IRON", "RETICCTPCT" in the last 72 hours. Sepsis Labs: No results for input(s): "PROCALCITON", "LATICACIDVEN" in the last 168 hours.  Recent Results (from the past 240 hour(s))  C Difficile Quick Screen (NO PCR Reflex)     Status: None   Collection Time: 11/12/21 11:00 AM   Specimen: STOOL  Result Value Ref Range Status   C Diff antigen NEGATIVE NEGATIVE Final   C Diff toxin NEGATIVE NEGATIVE Final   C Diff interpretation No C. difficile detected.  Final    Comment: Performed at [facility], [address] [place],  [address]         Radiology Studies: No results found.      Scheduled Meds:  ascorbic acid  500 mg Oral BID   cholestyramine  4 g Oral Daily   dicyclomine  10 mg Oral TID   diphenoxylate-atropine  2 tablet Oral Q6H   feeding supplement  1 Container Oral TID BM   Fluticasone Furoate  1 Inhalation Inhalation Daily   heparin  5,000 Units Subcutaneous Q8H   loperamide  4 mg Oral TID   megestrol  400 mg Oral BID   mirtazapine  7.5 mg Oral QHS   multivitamin with minerals  1 tablet Oral Daily   octreotide  200 mcg Subcutaneous TID   pantoprazole  40 mg Oral Daily   polycarbophil  625 mg Oral BID   sodium bicarbonate  650 mg Oral BID   zinc sulfate  220 mg Oral Daily     LOS: 21 days    Time spent: 35 minutes    Juliet Vasbinder Darleen Crocker, DO [facility]  If 7PM-7AM, please contact night-coverage [email or web address] 11/20/2021, 11:29 AM

## 2021-11-21 DIAGNOSIS — R197 Diarrhea, unspecified: Secondary | ICD-10-CM | POA: Diagnosis not present

## 2021-11-21 MED ORDER — GLYCOPYRROLATE 0.2 MG/ML IJ SOLN
0.2000 mg | INTRAMUSCULAR | Status: DC | PRN
  Administered 2021-11-26: 0.2 mg via SUBCUTANEOUS
  Filled 2021-11-21: qty 1

## 2021-11-21 MED ORDER — GLYCOPYRROLATE 1 MG PO TABS
1.0000 mg | ORAL_TABLET | ORAL | Status: DC | PRN
  Administered 2021-11-24: 1 mg via ORAL
  Filled 2021-11-21 (×2): qty 1

## 2021-11-21 MED ORDER — GLYCOPYRROLATE 0.2 MG/ML IJ SOLN: 0.2000 mg | INTRAMUSCULAR | Status: DC | PRN

## 2021-11-21 MED ORDER — HYDROMORPHONE HCL 1 MG/ML IJ SOLN
0.5000 mg | INTRAMUSCULAR | Status: DC | PRN
  Administered 2021-11-22 – 2021-11-23 (×4): 0.5 mg via INTRAVENOUS
  Filled 2021-11-21 (×5): qty 0.5

## 2021-11-21 MED ORDER — LORAZEPAM 1 MG PO TABS: 1.0000 mg | ORAL_TABLET | ORAL | Status: DC | PRN

## 2021-11-21 MED ORDER — LORAZEPAM 2 MG/ML PO CONC: 1.0000 mg | ORAL | Status: DC | PRN

## 2021-11-21 MED ORDER — LORAZEPAM 2 MG/ML IJ SOLN: 1.0000 mg | INTRAMUSCULAR | Status: DC | PRN

## 2021-11-21 NOTE — Progress Notes (Addendum)
Hydrologist [facility]) Hospital Liaison: RN note    Notified by Transition of Care Manger of patient/family request for [facility] services at home after discharge. Chart and patient information under review by [facility] physician. Hospice eligibility pending currently.    Writer spoke with  [name] to initiate education related to hospice philosophy, services and team approach to care.  [name] verbalized understanding of information given.   Per discussion, plan is for discharge to home by PTAR tomorrow once cleared by MD.  Please send signed and completed DNR form home with patient/family. Patient will need prescriptions for discharge comfort medications.     DME needs have been discussed, patient currently has the following equipment in the home: [facility].  Patient/family requests the following DME for delivery to the home:  Hospital Bed and BS table. [employer] equipment manager has been notified and will contact DME provider to arrange delivery to the home. Home address has been verified and is correct in the chart.  [name] is the family member to contact to arrange time of delivery.     [facility] aware of the above. Please notify [facility] when patient is ready to leave the unit at discharge. (Call [phone] or [phone] after 5pm.) [facility] information and contact numbers given to [facility]      Please call with any hospice related questions.     Thank you for this referral.    Clementeen Hoof, RN, [facility] (listed on AMION under Hospice and [place] [facility] [phone]  [phone]

## 2021-11-21 NOTE — Progress Notes (Signed)
PROGRESS NOTE    Regina Knox  LZJ:[id number] DOB: [birth year] DOA: 10/30/2021 PCP: Chevis Pretty, FNP   Brief Narrative:    Patient is a 76 year old female with history of anemia, asthma, hyperlipidemia, GERD, hypertension, colorectal cancer who presented with intractable diarrhea secondary to recent radiation therapy, weakness.  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  Oncology, general surgery, palliative care following.  Hospital course remarkable for persistent diarrhea, weakness.  Overall condition slowly improving, diuretics resumed.  Still has poor oral intake.  On Lomotil, Imodium, Questran, subcutaneous octreotide.  Ultimate plan is to discharge her to skilled nursing  facility, but palliative discussions are pending.  Possibly will require home with palliative services.  Started on mirtazapine 10/4.  She has had worsening lethargy and has now been transitioned to comfort care on 10/6.  She is currently awaiting home hospice placement.  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  -WBC is down to 10.9 from 14.5 on 11/11/21  - C. difficile, GI pathogen panel negative.   -Continue Lomotil, questran, Imodium and Octreotide.   -Diarrhea has been persistent - will add fiber to help bulk stools -Consider stopping octreotide once diarrhea improves further -GI consult appreciated -Recent CT abdomen/pelvis showed circumferential wall thickening, mild inflammation of the rectosigmoid colon, air-fluid level throughout the remaining colon, multiple small bowel loops in the  pelvis-findings consistent with enteritis and proctocolitis -Diet has been advanced   Chemo and malignancy induced anorexia/FTT--- started Megace for appetite stimulation -Nutritional supplements as ordered -Started on mirtazapine 10/4   Vaginal bleeding---??  Radiation vaginitis -Hgb stable =-Outpatient follow-up with GYN advised   History of rectal cancer: Patient was on Xeloda and was receiving radiation treatment.  Patient is not interested on continuing chemotherapy. -  Patient following with surgery to discuss about surgical options -Hgb stable   COPD: Currently stable.  Continue bronchodilators as needed   Hypokalemia: Supplemented, will follow in am   -Pseudo hypocalcemia--- when corrected for very low albumin, calcium is actually close to normal   Hyponatremia: Suspect dehydration related in the setting of GI losses and poor oral intake -Maintain adequate hydration   Non-anion gap metabolic disease: Likely due to loss of bicarb through diarrhea. -Improving with sodium bicarb tablets   Hypertension: Continue to hold olmesartan and  hydrochlorothiazide as BP is soft and patient had significant GI losses/diarrhea   Peripheral edema :   hypoalbuminemia.  -Started on albumin infusions and Lasix   Normocytic anemia: Currently hemoglobin stable   Weakness/debility:/Dispo PT/OT saw her, recommended SNF on discharge   Goals of care: Patient is now comfort care and awaiting home hospice placement.   Nutrition Problem: Increased nutrient needs Etiology: acute illness, cancer and cancer related treatments, chronic illness        Pressure Injury 11/12/21 Buttocks Right;Medial Stage 2 -  Partial thickness loss of dermis presenting as a shallow open injury with a red, pink wound bed without slough. 1cm x 1cm x 0cm (Active)  11/12/21 1000  Location: Buttocks  Location Orientation: Right;Medial  Staging: Stage 2 -  Partial thickness loss of dermis presenting as a shallow  open  injury with a red, pink wound bed without slough.  Wound Description (Comments): 1cm x 1cm x 0cm  Present on Admission: No  Dressing Type Foam - Lift dressing to assess site every shift 11/15/21 1945      DVT prophylaxis:Place TED hose Start: 11/06/21 1034 heparin injection 5,000 Units Start: 10/30/21 2215 SCDs Start: 10/30/21 2209       Code Status: DNR/comfort care   Family Communication: Discussed with son at bedside 10/6   patient status:Inpatient   Patient is :Home with hospice   Consultants: GI, general surgery, palliative care   Procedures: None    Antimicrobials:  Anti-infectives (From admission, onward)    Start     Dose/Rate Route Frequency Ordered Stop   11/16/21 1645  amoxicillin (AMOXIL) capsule 500 mg  Status:  Discontinued        500 mg Oral Every 8 hours 11/16/21 1547 11/17/21 1943       Subjective: Patient seen and evaluated today and appears quite lethargic.  Son is at bedside.  Objective: Vitals:   11/20/21 1326 11/20/21 1500 11/20/21 1759 11/21/21 0500  BP: 97/73 94/64 103/68   Pulse: (!) 107 (!) 105 (!) 108   Resp: 14 14    Temp: 98 F (36.7 C) 97.8 F (36.6 C) 98 F (36.7 C)   TempSrc: Oral Oral Oral   SpO2: 96% 94% 95%   Weight:    60.3 kg  Height:        Intake/Output Summary (Last 24 hours) at 11/21/2021 1407 Last data filed at 11/21/2021 1000 Gross per 24 hour  Intake 290 ml  Output 1700 ml  Net -1410 ml   Filed Weights   11/19/21 0500 11/20/21 0500 11/21/21 0500  Weight: 65 kg 64.8 kg 60.3 kg    Examination:  General exam: Appears lethargic/somnolent Respiratory system: Clear to auscultation. Respiratory effort normal. Cardiovascular system: S1 & S2 heard, RRR.  Gastrointestinal system: Abdomen is soft Central nervous system: Lethargic Extremities: No edema Skin: No significant lesions noted Psychiatry: Flat affect.    Data Reviewed: I have personally reviewed following labs and imaging studies  CBC: Recent Labs   Lab 11/15/21 0830 11/16/21 0511 11/18/21 0332 11/19/21 0553  WBC 11.2* 10.9* 9.4 10.4  NEUTROABS 9.3*  --   --   --   HGB 12.3 11.6* 10.0* 10.5*  HCT 35.3* 34.0* 29.4* 30.3*  MCV 92.9 92.9 93.3 91.8  PLT 189 188 183 283   Basic Metabolic Panel: Recent Labs  Lab 11/16/21 0511 11/17/21 0539 11/18/21 0332 11/19/21 0339 11/20/21 0303  NA 129* 127* 131* 132* 131*  K 4.4 4.1 3.6 3.4* 4.0  CL 100 99 98 98 97*  CO2 22 21* '24 22 25  '$ GLUCOSE 114* 119* 119* 82 101*  BUN 21 24* '22 17 20  '$ CREATININE 0.91 0.67 0.72 0.56 0.59  CALCIUM 6.4* 6.7* 6.9* 6.7* 6.9*  MG  --   --   --  2.1 2.1  PHOS  --  2.7 2.7  --   --    GFR: Estimated Creatinine Clearance: 47.9 mL/min (by C-G formula based on SCr of 0.59 mg/dL). Liver Function Tests: Recent Labs  Lab 11/16/21 0511 11/17/21 0539 11/18/21 0332  AST 21  --   --   ALT 18  --   --   ALKPHOS 98  --   --   BILITOT 0.6  --   --   PROT 3.7*  --   --   ALBUMIN <  1.5* <1.5* 2.2*   No results for input(s): "LIPASE", "AMYLASE" in the last 168 hours. No results for input(s): "AMMONIA" in the last 168 hours. Coagulation Profile: No results for input(s): "INR", "PROTIME" in the last 168 hours. Cardiac Enzymes: No results for input(s): "CKTOTAL", "CKMB", "CKMBINDEX", "TROPONINI" in the last 168 hours. BNP (last 3 results) No results for input(s): "PROBNP" in the last 8760 hours. HbA1C: No results for input(s): "HGBA1C" in the last 72 hours. CBG: No results for input(s): "GLUCAP" in the last 168 hours. Lipid Profile: No results for input(s): "CHOL", "HDL", "LDLCALC", "TRIG", "CHOLHDL", "LDLDIRECT" in the last 72 hours. Thyroid Function Tests: No results for input(s): "TSH", "T4TOTAL", "FREET4", "T3FREE", "THYROIDAB" in the last 72 hours. Anemia Panel: No results for input(s): "VITAMINB12", "FOLATE", "FERRITIN", "TIBC", "IRON", "RETICCTPCT" in the last 72 hours. Sepsis Labs: No results for input(s): "PROCALCITON", "LATICACIDVEN" in the  last 168 hours.  Recent Results (from the past 240 hour(s))  C Difficile Quick Screen (NO PCR Reflex)     Status: None   Collection Time: 11/12/21 11:00 AM   Specimen: STOOL  Result Value Ref Range Status   C Diff antigen NEGATIVE NEGATIVE Final   C Diff toxin NEGATIVE NEGATIVE Final   C Diff interpretation No C. difficile detected.  Final    Comment: Performed at [facility], [address], [place], [place] [address]         Radiology Studies: No results found.      Scheduled Meds:  cholestyramine  4 g Oral Daily   dicyclomine  10 mg Oral TID   diphenoxylate-atropine  2 tablet Oral Q6H   feeding supplement  1 Container Oral TID BM   Fluticasone Furoate  1 Inhalation Inhalation Daily   multivitamin with minerals  1 tablet Oral Daily   pantoprazole  40 mg Oral Daily   polycarbophil  625 mg Oral BID     LOS: 22 days    Time spent: 35 minutes    Brindle Leyba Darleen Crocker, DO [facility]  If 7PM-7AM, please contact night-coverage [email or web address] 11/21/2021, 2:07 PM

## 2021-11-21 NOTE — Progress Notes (Signed)
Patient scheduled 2200 medications held, patient not alert enough to swallow medications. When attempting to give her water she could not stay awake and began to cough. Family at bedside and agree with holding medication for now. Secure chat sent to Dr. Josephine Cables.

## 2021-11-21 NOTE — TOC Progression Note (Signed)
Transition of Care [facility]) - Progression Note    Patient Details  Name: Regina Knox MRN: [id number] Date of Birth: Dec 21, 1945  Transition [facility]) CM/SW Contact  Shade Flood, LCSW Phone Number: 11/21/2021, 2:10 PM  Clinical Narrative:     TOC following. At this time, pt is electing to return home with hospice services. TOC discussed CMS provider options with pt/family and referral made to [facility], [place]. At this time, plan is for dc home tomorrow once hospital bed has been set up in the home. MD aware.  Weekend TOC will follow.  Expected Discharge Plan: [place] Barriers to Discharge: Equipment Delay  Expected Discharge Plan and Services Expected Discharge Plan: [place]: Hospice Living arrangements for the past 2 months: [place]: PT [place]: [place] Date [place]: 11/11/21   Representative spoke with at [place]: [place] Determinants of Health (SDOH) Interventions Housing Interventions: Intervention Not Indicated  Readmission Risk Interventions    11/05/2021    1:45 PM  Readmission Risk Prevention Plan  Transportation Screening Complete  HRI or [place] Complete  Social Work Consult for [place] Planning/Counseling Complete  Palliative Care Screening Not Applicable  Medication Review [employer]) Complete

## 2021-11-21 NOTE — Progress Notes (Signed)
   11/21/21 0941  Urethral Catheter val dildy, LPN Non-latex 16 Fr.  Placement Date/Time: 11/21/21 0935   Perineal care performed prior to insertion?: Yes  Person Inserting LDA: val dildy, LPN  Person Assisting with Catheter Insertion: Timia Casselman, RN/brittany Alford Highland, LPN  Patient Location at Time of Insertion (Ca...  Indication for Insertion or Continuance of Catheter Acute urinary retention (I&O Cath for 24 hrs prior to catheter insertion- Inpatient Only) (bladder scan volume <745)  Site Assessment Clean, Dry, Intact  Catheter Maintenance Bag below level of bladder;Catheter secured;Drainage bag/tubing not touching floor;Insertion date on drainage bag;Seal intact;No dependent loops  Collection Container Standard drainage bag  Securement Method Securing device (Describe) (stat lock)  Urinary Catheter Interventions (if applicable) Unclamped   Bladder scan volume >745. Notified Dr Manuella Ghazi, order to insert foley. Immediate return of yellow/amber cloudy urine output, >700 out.

## 2021-11-21 NOTE — Progress Notes (Signed)
Patient with persistent frequent bowel movements and discomfort.  Family agreed to rectal tube per RN.  Rectal tube was ordered.

## 2021-11-22 DIAGNOSIS — R197 Diarrhea, unspecified: Secondary | ICD-10-CM | POA: Diagnosis not present

## 2021-11-22 MED ORDER — LORAZEPAM 0.5 MG PO TABS: 0.5000 mg | ORAL_TABLET | Freq: Three times a day (TID) | ORAL | 0 refills | Status: AC | PRN

## 2021-11-22 MED ORDER — MORPHINE SULFATE 20 MG/5ML PO SOLN: 2.5000 mg | ORAL | 0 refills | Status: AC | PRN

## 2021-11-22 NOTE — Discharge Summary (Signed)
Physician Discharge Summary  Regina Knox:[id number] DOB: 09-15-1945 DOA: 10/30/2021  PCP: Chevis Pretty, FNP  Admit date: 10/30/2021  Discharge date: 11/22/2021  Admitted From:Home  Disposition:  Home with hospice  Recommendations for Outpatient Follow-up:  Follow up with hospice agency Morphine solution provided for comfort Ativan provided for comfort/anxiety  Home Health: None  Equipment/Devices: None  Discharge Condition:Stable  CODE STATUS: DNR  Diet recommendation: Pleasure feeds  Brief/Interim Summary: Patient is a 76 year old female with history of anemia, asthma, hyperlipidemia, GERD, hypertension, colorectal cancer who presented with intractable diarrhea secondary to recent radiation therapy, weakness.  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  Oncology, general surgery, palliative care following.  Hospital course remarkable for persistent diarrhea, weakness.  Overall condition slowly has worsened.  She continues to have ongoing diarrhea and poor oral intake.  Palliative care has had discussions with family members and she was made DNR.  Started on mirtazapine 10/4.  She has had worsening lethargy and has now been transitioned to comfort care on 10/6.  She is now in stable condition for discharge to home with hospice and appears to have a prognosis of hours to days.  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  Discharge Instructions  Discharge Instructions     Diet - low sodium heart healthy   Complete by: As directed    Increase activity slowly    Complete by: As directed    No wound care   Complete by: As directed       Allergies as of 11/22/2021       Reactions   Ibuprofen Rash        Medication List     STOP taking these medications    Arnuity Ellipta 100 MCG/ACT Aepb Generic drug: Fluticasone Furoate   capecitabine 500 MG tablet Commonly known as: XELODA   fluticasone 50 MCG/ACT nasal spray Commonly known as: FLONASE   Iron (Ferrous Sulfate) 325 (65 Fe) MG Tabs   olmesartan-hydrochlorothiazide 40-25 MG tablet Commonly known as: Benicar HCT   Vitamin D (Ergocalciferol) 1.25 MG (50000 UNIT) Caps capsule Commonly known as: DRISDOL       TAKE these medications    diphenoxylate-atropine 2.5-0.025 MG tablet Commonly known as: LOMOTIL Take 1 tablet by mouth 4 (four) times daily as needed for diarrhea or loose stools.   LORazepam 0.5 MG tablet Commonly known as: Ativan Take 1 tablet (0.5 mg total) by mouth every 8 (eight) hours as needed for anxiety.   morphine 20 MG/5ML solution Take 0.6 mLs (2.4 mg total) by mouth every 2 (two) hours as needed for pain (dyspnea or anxiety).   prochlorperazine 10 MG tablet Commonly known as: COMPAZINE Take 1 tablet (10 mg total) by mouth every 6 (six) hours as needed for nausea or vomiting.        Contact information for follow-up providers     Accident Follow up.   Why: [facility] staff will call you to schedule in home visits             Contact information for after-discharge care     Destination     [facility] .   Service: Skilled Nursing Contact information: [address] [address] [phone]                    Allergies  Allergen Reactions   Ibuprofen Rash    Consultations: GI General surgery Palliative care   Procedures/Studies: CT ABDOMEN PELVIS W CONTRAST  Result Date: 11/10/2021 CLINICAL DATA:  Abdominal  pain. Patient is currently on chemo and radiation for rectal cancer. EXAM: CT ABDOMEN AND PELVIS WITH CONTRAST TECHNIQUE: Multidetector CT imaging of the abdomen and pelvis was performed using the standard protocol following bolus administration of intravenous contrast. RADIATION DOSE REDUCTION: This exam was performed according to the departmental dose-optimization program which includes automated exposure control, adjustment of the mA and/or kV according to patient size and/or use of iterative reconstruction technique. CONTRAST:  69m OMNIPAQUE IOHEXOL 300 MG/ML  SOLN COMPARISON:  CT chest abdomen and pelvis 08/21/2021. MRI abdomen 09/29/2021. FINDINGS: Lower chest: There is a new small right pleural effusion. Hepatobiliary: Again seen is a calcified granuloma in the liver. No new hepatic lesions are seen. Gallbladder and bile ducts are within normal limits. Pancreas: Unremarkable. No pancreatic ductal dilatation or surrounding inflammatory changes. Spleen: Normal in size without focal abnormality. Adrenals/Urinary Tract: Adrenal glands are unremarkable. Kidneys are normal, without renal calculi, focal lesion, or hydronephrosis. Bladder is unremarkable. Stomach/Bowel: There is circumferential wall thickening and mild inflammation of the rectosigmoid colon. Air-fluid levels are seen throughout the remaining colon. Multiple small bowel loops in the pelvis and the terminal ileum demonstrate circumferential wall thickening with associated mesenteric edema and mild surrounding inflammation compatible with enteritis. There is upstream dilatation of small bowel with air-fluid levels measuring up to 3.2 cm. There is a large hiatal hernia. The stomach is nondilated. No evidence for pneumatosis, portal venous gas, or free air. Appendix is not seen. Vascular/Lymphatic: Aorta and IVC are normal in size. There are prominent perirectal lymph nodes present. These have decreased in size when compared to the prior study. No new  enlarged lymph nodes are seen. Reproductive: Uterus and bilateral adnexa are unremarkable. Other: There is new small amount of free fluid throughout the abdomen and pelvis. There is new body wall edema. No focal abdominal wall hernia. Musculoskeletal: No acute or significant osseous findings. IMPRESSION: 1. There is wall thickening and inflammation of ileal loops and terminal ileum compatible with nonspecific enteritis. There is resulted upstream small bowel obstruction. 2. Rectosigmoid colon wall thickening compatible with proctocolitis. 3. Perirectal lymphadenopathy has mildly decreased. 4. New small volume ascites and anasarca. 5. New small right pleural effusion. Electronically Signed   By: ARonney AstersM.D.   On: 11/10/2021 18:31   DG Abd 2 Views  Result Date: 11/10/2021 CLINICAL DATA:  Diarrhea, abdominal pain EXAM: ABDOMEN - 2 VIEW COMPARISON:  Chest radiographs done earlier today FINDINGS: There is abnormal dilation of multiple small bowel loops in upper abdomen measuring up to 4.3 cm in diameter. Stomach is not distended. Colon is not distended. There is no pneumoperitoneum. There is 3 mm calcific density overlying the right kidney, possibly renal stone. Right pleural effusion is seen. IMPRESSION: There is abnormal dilation of small-bowel loops suggesting possible partial small bowel obstruction. If clinically warranted, follow-up CT may be considered. Possible small right renal stone. Electronically Signed   By: PElmer PickerM.D.   On: 11/10/2021 13:27   DG CHEST PORT 1 VIEW  Result Date: 11/10/2021 CLINICAL DATA:  Leukocytosis EXAM: PORTABLE CHEST 1 VIEW COMPARISON:  CT 08/21/2021 FINDINGS: The cardiomediastinal silhouette is within normal limits. There is a small right pleural effusion and adjacent right basilar opacities. Skin folds overlie the chest bilaterally. No evidence of pneumothorax. Dilated loops of small bowel in the left upper abdomen. IMPRESSION: Small right pleural effusion  with adjacent right basilar opacities, could be atelectasis or infection. Dilated loops of small bowel in the upper left abdomen, correlate with exam and consider abdominal radiographs. Electronically Signed   By: Maurine Simmering M.D.   On: 11/10/2021 10:06   CT ABDOMEN PELVIS WO CONTRAST  Result Date: 11/04/2021 Shirline Frees     11/04/2021  5:42 PM Pt refused CT Abe/Pel scan     Discharge Exam: Vitals:   11/20/21 1500 11/20/21 1759  BP: 94/64 103/68  Pulse: (!) 105 (!) 108  Resp: 14   Temp: 97.8 F (36.6 C) 98 F (36.7 C)  SpO2: 94% 95%   Vitals:   11/20/21 1500 11/20/21 1759 11/21/21 0500 11/22/21 0500  BP: 94/64 103/68    Pulse: (!) 105 (!) 108    Resp: 14     Temp: 97.8 F (36.6 C) 98 F (36.7 C)    TempSrc: Oral Oral    SpO2: 94% 95%    Weight:   60.3 kg 60.3 kg  Height:        General: Pt unresponsive and lethargic Cardiovascular: RRR, S1/S2 +, no rubs, no gallops Respiratory: CTA bilaterally, no wheezing, no rhonchi Abdominal: Soft, NT, ND, bowel sounds + Extremities: no edema, no cyanosis    The results of significant diagnostics from this hospitalization (including imaging, microbiology, ancillary and laboratory) are listed below for reference.     Microbiology: Recent Results (from the past 240 hour(s))  C Difficile Quick Screen (NO PCR Reflex)     Status: None   Collection Time: 11/12/21 11:00 AM   Specimen: STOOL  Result Value Ref Range Status   C Diff antigen NEGATIVE NEGATIVE Final   C Diff toxin NEGATIVE NEGATIVE Final   C Diff interpretation No C. difficile detected.  Final    Comment: Performed at [facility], [address] [place], [place] [address]     Labs: BNP (last 3 results) No results for input(s): "BNP" in the last 8760 hours. Basic Metabolic Panel: Recent Labs  Lab 11/16/21 0511 11/17/21 0539 11/18/21 0332 11/19/21 0339 11/20/21 0303  NA 129* 127* 131* 132* 131*  K 4.4 4.1 3.6 3.4* 4.0  CL 100 99 98 98 97*  CO2 22 21* '24  22 25  '$ GLUCOSE 114* 119* 119* 82 101*  BUN 21 24* '22 17 20  '$ CREATININE 0.91 0.67 0.72 0.56 0.59  CALCIUM 6.4* 6.7* 6.9* 6.7* 6.9*  MG  --   --   --  2.1 2.1  PHOS  --  2.7 2.7  --   --    Liver Function Tests: Recent Labs  Lab 11/16/21 0511 11/17/21 0539 11/18/21 0332  AST 21  --   --   ALT 18  --   --   ALKPHOS 98  --   --   BILITOT 0.6  --   --   PROT 3.7*  --   --   ALBUMIN <1.5* <1.5* 2.2*   No results for input(s): "LIPASE", "AMYLASE" in the last 168 hours. No results for input(s): "AMMONIA" in the last 168 hours. CBC: Recent Labs  Lab 11/16/21 0511 11/18/21 0332 11/19/21 0553  WBC 10.9* 9.4  10.4  HGB 11.6* 10.0* 10.5*  HCT 34.0* 29.4* 30.3*  MCV 92.9 93.3 91.8  PLT 188 183 183   Cardiac Enzymes: No results for input(s): "CKTOTAL", "CKMB", "CKMBINDEX", "TROPONINI" in the last 168 hours. BNP: Invalid input(s): "POCBNP" CBG: No results for input(s): "GLUCAP" in the last 168 hours. D-Dimer No results for input(s): "DDIMER" in the last 72 hours. Hgb A1c No results for input(s): "HGBA1C" in the last 72 hours. Lipid Profile No results for input(s): "CHOL", "HDL", "LDLCALC", "TRIG", "CHOLHDL", "LDLDIRECT" in the last 72 hours. Thyroid function studies No results for input(s): "TSH", "T4TOTAL", "T3FREE", "THYROIDAB" in the last 72 hours.  Invalid input(s): "FREET3" Anemia work up No results for input(s): "VITAMINB12", "FOLATE", "FERRITIN", "TIBC", "IRON", "RETICCTPCT" in the last 72 hours. Urinalysis    Component Value Date/Time   COLORURINE AMBER (A) 11/10/2021 0904   APPEARANCEUR CLOUDY (A) 11/10/2021 0904   APPEARANCEUR Cloudy (A) 06/07/2017 1748   LABSPEC 1.027 11/10/2021 0904   PHURINE 5.0 11/10/2021 0904   GLUCOSEU NEGATIVE 11/10/2021 0904   HGBUR MODERATE (A) 11/10/2021 0904   BILIRUBINUR NEGATIVE 11/10/2021 0904   BILIRUBINUR Negative 06/07/2017 1748   KETONESUR 20 (A) 11/10/2021 0904   PROTEINUR 30 (A) 11/10/2021 0904   NITRITE NEGATIVE  11/10/2021 0904   LEUKOCYTESUR MODERATE (A) 11/10/2021 0904   Sepsis Labs Recent Labs  Lab 11/16/21 0511 11/18/21 0332 11/19/21 0553  WBC 10.9* 9.4 10.4   Microbiology Recent Results (from the past 240 hour(s))  C Difficile Quick Screen (NO PCR Reflex)     Status: None   Collection Time: 11/12/21 11:00 AM   Specimen: STOOL  Result Value Ref Range Status   C Diff antigen NEGATIVE NEGATIVE Final   C Diff toxin NEGATIVE NEGATIVE Final   C Diff interpretation No C. difficile detected.  Final    Comment: Performed at [facility], [address], [place], [place] [address]     Time coordinating discharge: 35 minutes  SIGNED:   Rodena Goldmann, DO [facility] 11/22/2021, 8:59 AM  If 7PM-7AM, please contact night-coverage [email or web address]

## 2021-11-22 NOTE — Plan of Care (Signed)
  Problem: Coping: Goal: Level of anxiety will decrease Outcome: Progressing   Problem: Pain Managment: Goal: General experience of comfort will improve Outcome: Progressing   Problem: Skin Integrity: Goal: Risk for impaired skin integrity will decrease Outcome: Progressing   

## 2021-11-22 NOTE — Progress Notes (Signed)
Manufacturing engineer (ACC)  Spoke with son [name] who stated his mother has taken a turn for the worse and now would remain in the hospital for EOL.   I have updated our team.   If you have any questions feel free to call.  Thank you for this referral.  Clementeen Hoof, RN, [facility] [phone]

## 2021-11-23 DIAGNOSIS — R197 Diarrhea, unspecified: Secondary | ICD-10-CM | POA: Diagnosis not present

## 2021-11-23 NOTE — Progress Notes (Signed)
Over night, patient oriented to person and place, able to answer 'Yes' or 'No', or nods head when RN asks questions, able to swallow small sips of water. Lost IV access, RN gave PRN po medication crushed in half spoonful lemon flavored [place] ice the family brought in. Family refused patient's scheduled medications, but allowed pain medications.

## 2021-11-23 NOTE — Progress Notes (Signed)
PROGRESS NOTE    Regina Knox  FTD:[id number] DOB: April 02, 1945 DOA: 10/30/2021 PCP: Chevis Pretty, FNP   Brief Narrative:    Patient is a 76 year old female with history of anemia, asthma, hyperlipidemia, GERD, hypertension, colorectal cancer who presented with intractable diarrhea secondary to recent radiation therapy, weakness.  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  Oncology, general surgery, palliative care following.  Hospital course remarkable for persistent diarrhea, weakness.  Overall condition slowly improving, diuretics resumed.  Still has poor oral intake.  On Lomotil, Imodium, Questran, subcutaneous octreotide.  Ultimate plan is to discharge her to skilled nursing  facility, but palliative discussions are pending.  Possibly will require home with palliative services.  Started on mirtazapine 10/4.  She has had worsening lethargy and has now been transitioned to comfort care on 10/6.  She is currently on comfort care and has a prognosis of hours to days.  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  -WBC is down to 10.9 from 14.5 on 11/11/21  - C. difficile, GI pathogen panel negative.   -Continue Lomotil, questran, Imodium and Octreotide.   -Diarrhea has been persistent -Rectal tube placed   Chemo and malignancy induced anorexia/FTT--- started Megace for appetite stimulation -Nutritional supplements as ordered -Started on mirtazapine 10/4   Vaginal bleeding---??  Radiation vaginitis -Hgb stable =-Outpatient follow-up with GYN advised   History of  rectal cancer: Patient was on Xeloda and was receiving radiation treatment.  Patient is not interested on continuing chemotherapy. -  Patient following with surgery to discuss about surgical options -Hgb stable   COPD: Currently stable.  Continue bronchodilators as needed    Hypertension: Continue to hold olmesartan and  hydrochlorothiazide as BP is soft and patient had significant GI losses/diarrhea   Peripheral edema :   hypoalbuminemia.    Normocytic anemia: Currently hemoglobin stable   Weakness/debility:/Dispo PT/OT saw her, recommended SNF on discharge, now comfort care   Goals of care: Patient is now comfort care and awaiting home hospice placement.   Nutrition Problem: Increased nutrient needs Etiology: acute illness, cancer and cancer related treatments, chronic illness        Pressure Injury 11/12/21 Buttocks Right;Medial Stage 2 -  Partial thickness loss of dermis presenting as a shallow open injury with a red, pink wound bed without slough. 1cm x 1cm x 0cm (Active)  11/12/21 1000  Location: Buttocks  Location Orientation: Right;Medial  Staging: Stage 2 -  Partial thickness loss of dermis presenting as a shallow open injury with a red, pink wound bed without slough.  Wound Description (Comments): 1cm x 1cm x 0cm  Present on Admission: No  Dressing Type Foam - Lift dressing to assess site every shift 11/15/21 1945      DVT prophylaxis:Place TED hose Start: 11/06/21 1034 heparin injection 5,000 Units Start: 10/30/21 2215 SCDs Start: 10/30/21 2209       Code Status: DNR/comfort care   Family Communication: Discussed with son at bedside 10/8   patient status:Inpatient   Patient is :Home with hospice   Consultants: GI, general surgery, palliative care   Procedures: None  Antimicrobials:  Anti-infectives (From admission, onward)    Start     Dose/Rate Route Frequency Ordered Stop   11/16/21 1645  amoxicillin (AMOXIL) capsule 500 mg  Status:  Discontinued         500 mg Oral Every 8 hours 11/16/21 1547 11/17/21 1943       Subjective: Patient seen and evaluated today and appears more awake and alert this morning.  Liquid stool in rectal tube.  Objective: Vitals:   11/20/21 1759 11/21/21 0500 11/22/21 0500 11/22/21 1015  BP: 103/68     Pulse: (!) 108     Resp:    (!) 6  Temp: 98 F (36.7 C)     TempSrc: Oral     SpO2: 95%     Weight:  60.3 kg 60.3 kg   Height:       No intake or output data in the 24 hours ending 11/23/21 0940 Filed Weights   11/20/21 0500 11/21/21 0500 11/22/21 0500  Weight: 64.8 kg 60.3 kg 60.3 kg    Examination:  General exam: Appears calm and comfortable  Respiratory system: Clear to auscultation. Respiratory effort normal. Cardiovascular system: S1 & S2 heard, RRR.  Gastrointestinal system: Abdomen is soft Central nervous system: Alert and awake Extremities: No edema Skin: No significant lesions noted Psychiatry: Flat affect.    Data Reviewed: I have personally reviewed following labs and imaging studies  CBC: Recent Labs  Lab 11/18/21 0332 11/19/21 0553  WBC 9.4 10.4  HGB 10.0* 10.5*  HCT 29.4* 30.3*  MCV 93.3 91.8  PLT 183 409   Basic Metabolic Panel: Recent Labs  Lab 11/17/21 0539 11/18/21 0332 11/19/21 0339 11/20/21 0303  NA 127* 131* 132* 131*  K 4.1 3.6 3.4* 4.0  CL 99 98 98 97*  CO2 21* '24 22 25  '$ GLUCOSE 119* 119* 82 101*  BUN 24* '22 17 20  '$ CREATININE 0.67 0.72 0.56 0.59  CALCIUM 6.7* 6.9* 6.7* 6.9*  MG  --   --  2.1 2.1  PHOS 2.7 2.7  --   --    GFR: Estimated Creatinine Clearance: 47.9 mL/min (by C-G formula based on SCr of 0.59 mg/dL). Liver Function Tests: Recent Labs  Lab 11/17/21 0539 11/18/21 0332  ALBUMIN <1.5* 2.2*   No results for input(s): "LIPASE", "AMYLASE" in the last 168 hours. No results for input(s): "AMMONIA" in the last 168 hours. Coagulation Profile: No results for input(s): "INR", "PROTIME" in the last 168 hours. Cardiac Enzymes: No  results for input(s): "CKTOTAL", "CKMB", "CKMBINDEX", "TROPONINI" in the last 168 hours. BNP (last 3 results) No results for input(s): "PROBNP" in the last 8760 hours. HbA1C: No results for input(s): "HGBA1C" in the last 72 hours. CBG: No results for input(s): "GLUCAP" in the last 168 hours. Lipid Profile: No results for input(s): "CHOL", "HDL", "LDLCALC", "TRIG", "CHOLHDL", "LDLDIRECT" in the last 72 hours. Thyroid Function Tests: No results for input(s): "TSH", "T4TOTAL", "FREET4", "T3FREE", "THYROIDAB" in the last 72 hours. Anemia Panel: No results for input(s): "VITAMINB12", "FOLATE", "FERRITIN", "TIBC", "IRON", "RETICCTPCT" in the last 72 hours. Sepsis Labs: No results for input(s): "PROCALCITON", "LATICACIDVEN" in the last 168 hours.  No results found for this or any previous visit (from the past 240 hour(s)).       Radiology Studies: No results found.      Scheduled Meds:  cholestyramine  4 g Oral Daily   dicyclomine  10 mg Oral TID   diphenoxylate-atropine  2 tablet Oral Q6H   feeding supplement  1 Container Oral TID BM   Fluticasone Furoate  1 Inhalation Inhalation Daily   multivitamin with minerals  1 tablet Oral Daily   pantoprazole  40 mg Oral Daily   polycarbophil  625 mg Oral BID     LOS: 24 days    Time spent: 35 minutes    Leeanna Slaby Darleen Crocker, DO [facility]  If 7PM-7AM, please contact night-coverage [email or web address] 11/23/2021, 9:40 AM

## 2021-11-24 DIAGNOSIS — R197 Diarrhea, unspecified: Secondary | ICD-10-CM | POA: Diagnosis not present

## 2021-11-24 DIAGNOSIS — Z515 Encounter for palliative care: Secondary | ICD-10-CM | POA: Diagnosis not present

## 2021-11-24 MED ORDER — PROMETHAZINE HCL 25 MG RE SUPP: 25.0000 mg | Freq: Three times a day (TID) | RECTAL | Status: DC | PRN

## 2021-11-24 MED ORDER — MORPHINE SULFATE (CONCENTRATE) 10 MG/0.5ML PO SOLN
10.0000 mg | ORAL | Status: DC | PRN
  Administered 2021-11-25 – 2021-11-26 (×2): 10 mg via ORAL
  Filled 2021-11-24 (×2): qty 0.5

## 2021-11-24 MED ORDER — MORPHINE SULFATE (CONCENTRATE) 10 MG/0.5ML PO SOLN
5.0000 mg | Freq: Three times a day (TID) | ORAL | Status: DC
  Administered 2021-11-24 – 2021-11-26 (×6): 5 mg via SUBLINGUAL
  Filled 2021-11-24 (×6): qty 0.5

## 2021-11-24 MED ORDER — HYDROCORTISONE 1 % EX CREA
TOPICAL_CREAM | Freq: Three times a day (TID) | CUTANEOUS | Status: DC
  Filled 2021-11-24: qty 28

## 2021-11-24 MED ORDER — LIDOCAINE 5 % EX PTCH
1.0000 | MEDICATED_PATCH | CUTANEOUS | Status: DC
  Administered 2021-11-24 – 2021-11-26 (×3): 1 via TRANSDERMAL
  Filled 2021-11-24 (×2): qty 1

## 2021-11-24 MED ORDER — DIPHENOXYLATE-ATROPINE 2.5-0.025 MG PO TABS: 2.0000 | ORAL_TABLET | Freq: Four times a day (QID) | ORAL | Status: DC | PRN

## 2021-11-24 MED ORDER — CHOLESTYRAMINE 4 G PO PACK: 4.0000 g | PACK | Freq: Every day | ORAL | Status: DC | PRN

## 2021-11-24 NOTE — Progress Notes (Signed)
PROGRESS NOTE    Regina Knox  YYQ:[id number] DOB: 30-Aug-1945 DOA: 10/30/2021 PCP: Chevis Pretty, FNP   Brief Narrative:    Patient is a 76 year old female with history of anemia, asthma, hyperlipidemia, GERD, hypertension, colorectal cancer who presented with intractable diarrhea secondary to recent radiation therapy, weakness.  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  Oncology, general surgery, palliative care following.  Hospital course remarkable for persistent diarrhea, weakness.  Overall condition slowly improving, diuretics resumed.  Still has poor oral intake.  On Lomotil, Imodium, Questran, subcutaneous octreotide.  Ultimate plan is to discharge her to skilled nursing  facility, but palliative discussions are pending.  Possibly will require home with palliative services.  Started on mirtazapine 10/4.  She has had worsening lethargy and has now been transitioned to comfort care on 10/6.  She is currently on comfort care and has a prognosis of hours to days.  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  -WBC is down to 10.9 from 14.5 on 11/11/21  - C. difficile, GI pathogen panel negative.   -Continue Lomotil, questran, Imodium and Octreotide.   -Diarrhea has been persistent -Rectal tube placed   Chemo and malignancy induced anorexia/FTT--- started Megace for appetite stimulation -Nutritional supplements as ordered -Started on mirtazapine 10/4   Vaginal bleeding---??  Radiation vaginitis -Hgb stable =-Outpatient follow-up with GYN advised   History of  rectal cancer: Patient was on Xeloda and was receiving radiation treatment.  Patient is not interested on continuing chemotherapy. -  Patient following with surgery to discuss about surgical options -Hgb stable   COPD: Currently stable.  Continue bronchodilators as needed     Hypertension: Continue to hold olmesartan and  hydrochlorothiazide as BP is soft and patient had significant GI losses/diarrhea   Peripheral edema :   hypoalbuminemia.    Normocytic anemia: Currently hemoglobin stable   Weakness/debility:/Dispo PT/OT saw her, recommended SNF on discharge, now comfort care   Goals of care: Patient is now comfort care and awaiting home hospice placement.   Nutrition Problem: Increased nutrient needs Etiology: acute illness, cancer and cancer related treatments, chronic illness        Pressure Injury 11/12/21 Buttocks Right;Medial Stage 2 -  Partial thickness loss of dermis presenting as a shallow open injury with a red, pink wound bed without slough. 1cm x 1cm x 0cm (Active)  11/12/21 1000  Location: Buttocks  Location Orientation: Right;Medial  Staging: Stage 2 -  Partial thickness loss of dermis presenting as a shallow open injury with a red, pink wound bed without slough.  Wound Description (Comments): 1cm x 1cm x 0cm  Present on Admission: No  Dressing Type Foam - Lift dressing to assess site every shift 11/15/21 1945      DVT prophylaxis:Place TED hose Start: 11/06/21 1034 heparin injection 5,000 Units Start: 10/30/21 2215 SCDs Start: 10/30/21 2209       Code Status: DNR/comfort care   Family Communication: Discussed with son at bedside 10/9   patient status:Inpatient   Patient is :Home with hospice   Consultants: GI, general surgery, palliative care   Procedures: None  Antimicrobials:  Anti-infectives (From admission, onward)    Start     Dose/Rate Route Frequency Ordered Stop   11/16/21 1645  amoxicillin (AMOXIL) capsule 500 mg  Status:  Discontinued         500 mg Oral Every 8 hours 11/16/21 1547 11/17/21 1943       Subjective: Patient seen and evaluated today and she is complaining of some discomfort related to her rectal tube.  Son is at bedside and would like for her to remain hospitalized with comfort measures.  Objective: Vitals:   11/20/21 1759 11/21/21 0500 11/22/21 0500 11/22/21 1015  BP: 103/68     Pulse: (!) 108     Resp:    (!) 6  Temp: 98 F (36.7 C)     TempSrc: Oral     SpO2: 95%     Weight:  60.3 kg 60.3 kg   Height:        Intake/Output Summary (Last 24 hours) at 11/24/2021 1235 Last data filed at 11/23/2021 1854 Gross per 24 hour  Intake 60 ml  Output 650 ml  Net -590 ml   Filed Weights   11/20/21 0500 11/21/21 0500 11/22/21 0500  Weight: 64.8 kg 60.3 kg 60.3 kg    Examination:  General exam: Appears calm and comfortable  Respiratory system: Clear to auscultation. Respiratory effort normal. Cardiovascular system: S1 & S2 heard, RRR.  Gastrointestinal system: Abdomen is soft Central nervous system: Alert and awake Extremities: No edema Skin: No significant lesions noted Psychiatry: Flat affect.    Data Reviewed: I have personally reviewed following labs and imaging studies  CBC: Recent Labs  Lab 11/18/21 0332 11/19/21 0553  WBC 9.4 10.4  HGB 10.0* 10.5*  HCT 29.4* 30.3*  MCV 93.3 91.8  PLT 183 892   Basic Metabolic Panel: Recent Labs  Lab 11/18/21 0332 11/19/21 0339 11/20/21 0303  NA 131* 132* 131*  K 3.6 3.4* 4.0  CL 98 98 97*  CO2 '24 22 25  '$ GLUCOSE 119* 82 101*  BUN '22 17 20  '$ CREATININE 0.72 0.56 0.59  CALCIUM 6.9* 6.7* 6.9*  MG  --  2.1 2.1  PHOS 2.7  --   --    GFR: Estimated Creatinine Clearance: 47.9 mL/min (by C-G formula based on SCr of 0.59 mg/dL). Liver Function Tests: Recent Labs  Lab 11/18/21 0332  ALBUMIN 2.2*   No results for input(s): "LIPASE", "AMYLASE" in the last 168 hours. No results for input(s): "AMMONIA" in the last 168 hours. Coagulation  Profile: No results for input(s): "INR", "PROTIME" in the last 168 hours. Cardiac Enzymes: No results for input(s): "CKTOTAL", "CKMB", "CKMBINDEX", "TROPONINI" in the last 168 hours. BNP (last 3 results) No results for input(s): "PROBNP" in the last 8760 hours. HbA1C: No results for input(s): "HGBA1C" in the last 72 hours. CBG: No results for input(s): "GLUCAP" in the last 168 hours. Lipid Profile: No results for input(s): "CHOL", "HDL", "LDLCALC", "TRIG", "CHOLHDL", "LDLDIRECT" in the last 72 hours. Thyroid Function Tests: No results for input(s): "TSH", "T4TOTAL", "FREET4", "T3FREE", "THYROIDAB" in the last 72 hours. Anemia Panel: No results for input(s): "VITAMINB12", "FOLATE", "FERRITIN", "TIBC", "IRON", "RETICCTPCT" in the last 72 hours. Sepsis Labs: No results for input(s): "PROCALCITON", "LATICACIDVEN" in the last 168 hours.  No results found for this or any previous visit (from the past 240 hour(s)).       Radiology Studies: No results found.      Scheduled Meds:  cholestyramine  4 g Oral Daily  dicyclomine  10 mg Oral TID   diphenoxylate-atropine  2 tablet Oral Q6H   feeding supplement  1 Container Oral TID BM   Fluticasone Furoate  1 Inhalation Inhalation Daily   hydrocortisone cream   Topical TID   lidocaine  1 patch Transdermal Q24H   pantoprazole  40 mg Oral Daily   polycarbophil  625 mg Oral BID    LOS: 25 days    Time spent: 35 minutes    Regina Knox Darleen Crocker, DO [facility]  If 7PM-7AM, please contact night-coverage [email or web address] 11/24/2021, 12:35 PM

## 2021-11-24 NOTE — Progress Notes (Signed)
Palliative:  HPI:  76 y.o. female  with past medical history of colorectal cancer with concurrent chemoradiation, last treatment approximately 2 weeks ago, completed 19 sessions, HTN/HLD, GERD, anemia, asthma, admitted on 10/30/2021 with intractable diarrhea likely secondary to radiation therapy and resultant radiation enteritis.    I spoke with son, [name], as Ms. Nicolaisen slept. [name] is leaving the hospital for the first time in days at the urging of staff encouraging him to care for himself. He has a trusted friend coming to stay with his mother. He recounts conversation and decline over the past days. He expresses acceptance that his mother is at end of life. He originally wished to take her home with hospice but as she worsened the risk of transport was discussed and he did not want to put her through transport with the risk she could die in transport. He sees how much just turning and bathing in bed takes out of her and he does not feel that she would be able to make it home. He does not want to put her through a transfer somewhere else and wishes to maintain her comfort at end of life here in the hospital.   [name] expresses that she is able to have some meaningful moments with him and they have much love and support from family and friends but he has also struggled to try and maintain visits while allowing his mother much needed rest. We discussed some boundaries and allowing hospital staff to help with minimizing visitation as desired. [name] is appreciative of the care his mother has received. He plans to return to hospital later today to continue to hold vigil at his mother's bedside. I explained that we will continue to support him and I will make some recommendations for improved symptom management without use of IV.   All questions/concerns addressed. Emotional support provided.   Exam: Sleeping peacefully. Pale. Frail. I did not awaken. Breathing shallow.   Plan: - Continue comfort  care. Anticipate hospital death.  - Medications adjusted to provide po/SL alternatives for symptom management. Minimized po medications as she has not been taking many over the past days - will d/c.  - Scheduled low dose pain medication to try and maintain improved relief.   [place], NP Palliative Medicine Team Pager [phone] (Please see [email or web address] for schedule) Team Phone [phone]    Greater than 50%  of this time was spent counseling and coordinating care related to the above assessment and plan

## 2021-11-24 NOTE — TOC Progression Note (Signed)
Transition of Care [facility]) - Progression Note    Patient Details  Name: DERITA MICHELSEN MRN: [id number] Date of Birth: [birth year]  Transition of Care [facility]) CM/SW Contact  Salome Arnt, [place] Phone Number: 11/24/2021, 2:40 PM  Clinical Narrative:  LCSW discussed pt with MD and palliative. Pt appropriate for GIP. Referral was made to [facility] last week with plan to return home with hospice. However, pt not stable for transport. LCSW discussed GIP with pt's son who is agreeable to refer to [facility]. [facility] updated. Per [facility], admission RN will come at noon tomorrow to admit to GIP. MD and palliative updated.      Expected Discharge Plan: [place] Barriers to Discharge: Equipment Delay  Expected Discharge Plan and Services Expected Discharge Plan: [place] Choice: Hospice Living arrangements for the past 2 months: Single Family Home Expected Discharge Date: 11/22/21                         HH Arranged: PT HH Agency: [place] Date [place]: 11/11/21   Representative spoke with at [place]: [place] Determinants of Health (SDOH) Interventions Housing Interventions: Intervention Not Indicated  Readmission Risk Interventions    11/05/2021    1:45 PM  Readmission Risk Prevention Plan  Transportation Screening Complete  HRI or [place] Complete  Social Work Consult for [place] Planning/Counseling Complete  Palliative Care Screening Not Applicable  Medication Review Press photographer) Complete

## 2021-11-24 NOTE — Progress Notes (Signed)
NT and nurse bathed pt. Oral medication was given 30 minutes prior to moving pt around pt was whimpering with every movement and expressed she was in pain. Nurse asked if she wanted IV pain medication to help and she stated yes. Attempt was made for new IV and was unsuccessful. Charge nurse tried while son was bedside and Son stated " he didn't want to put his mom throw that and the doctor agreed". Pt was then agreeable that she no longer wanted IV.

## 2021-11-25 DIAGNOSIS — Y842 Radiological procedure and radiotherapy as the cause of abnormal reaction of the patient, or of later complication, without mention of misadventure at the time of the procedure: Secondary | ICD-10-CM | POA: Diagnosis present

## 2021-11-25 DIAGNOSIS — E8770 Fluid overload, unspecified: Secondary | ICD-10-CM | POA: Diagnosis not present

## 2021-11-25 DIAGNOSIS — R188 Other ascites: Secondary | ICD-10-CM | POA: Diagnosis present

## 2021-11-25 DIAGNOSIS — J449 Chronic obstructive pulmonary disease, unspecified: Secondary | ICD-10-CM | POA: Diagnosis present

## 2021-11-25 DIAGNOSIS — N939 Abnormal uterine and vaginal bleeding, unspecified: Secondary | ICD-10-CM | POA: Diagnosis not present

## 2021-11-25 DIAGNOSIS — C20 Malignant neoplasm of rectum: Secondary | ICD-10-CM | POA: Diagnosis present

## 2021-11-25 DIAGNOSIS — D630 Anemia in neoplastic disease: Secondary | ICD-10-CM | POA: Diagnosis present

## 2021-11-25 DIAGNOSIS — Z66 Do not resuscitate: Secondary | ICD-10-CM | POA: Diagnosis not present

## 2021-11-25 DIAGNOSIS — E860 Dehydration: Secondary | ICD-10-CM | POA: Diagnosis present

## 2021-11-25 DIAGNOSIS — R197 Diarrhea, unspecified: Secondary | ICD-10-CM | POA: Diagnosis not present

## 2021-11-25 DIAGNOSIS — R54 Age-related physical debility: Secondary | ICD-10-CM | POA: Diagnosis present

## 2021-11-25 DIAGNOSIS — F32A Depression, unspecified: Secondary | ICD-10-CM | POA: Diagnosis present

## 2021-11-25 DIAGNOSIS — L89312 Pressure ulcer of right buttock, stage 2: Secondary | ICD-10-CM | POA: Diagnosis not present

## 2021-11-25 DIAGNOSIS — E43 Unspecified severe protein-calorie malnutrition: Secondary | ICD-10-CM | POA: Diagnosis present

## 2021-11-25 DIAGNOSIS — I10 Essential (primary) hypertension: Secondary | ICD-10-CM | POA: Diagnosis present

## 2021-11-25 DIAGNOSIS — Z515 Encounter for palliative care: Secondary | ICD-10-CM | POA: Diagnosis not present

## 2021-11-25 DIAGNOSIS — E871 Hypo-osmolality and hyponatremia: Secondary | ICD-10-CM | POA: Diagnosis present

## 2021-11-25 DIAGNOSIS — K6289 Other specified diseases of anus and rectum: Secondary | ICD-10-CM | POA: Diagnosis present

## 2021-11-25 DIAGNOSIS — I959 Hypotension, unspecified: Secondary | ICD-10-CM | POA: Diagnosis not present

## 2021-11-25 DIAGNOSIS — R64 Cachexia: Secondary | ICD-10-CM | POA: Diagnosis present

## 2021-11-25 DIAGNOSIS — R000 Tachycardia, unspecified: Secondary | ICD-10-CM | POA: Diagnosis not present

## 2021-11-25 DIAGNOSIS — E8809 Other disorders of plasma-protein metabolism, not elsewhere classified: Secondary | ICD-10-CM | POA: Diagnosis present

## 2021-11-25 DIAGNOSIS — E861 Hypovolemia: Secondary | ICD-10-CM | POA: Diagnosis present

## 2021-11-25 DIAGNOSIS — K520 Gastroenteritis and colitis due to radiation: Secondary | ICD-10-CM | POA: Diagnosis present

## 2021-11-25 DIAGNOSIS — R17 Unspecified jaundice: Secondary | ICD-10-CM | POA: Diagnosis not present

## 2021-11-25 NOTE — Plan of Care (Signed)
  Problem: Role Relationship: Goal: Family's ability to cope with current situation will improve Outcome: Not Progressing   Problem: Role Relationship: Goal: Ability to verbalize concerns, feelings, and thoughts to partner or family member will improve Outcome: Not Progressing

## 2021-11-25 NOTE — H&P (Signed)
History and Physical    Regina Knox YBW:[id number] DOB: [birth year] DOA: 10/30/2021  PCP: Chevis Pretty, FNP   Patient coming from: Inpatient hospice   HPI: Regina Knox is a 76 y.o. female with medical history significant for  history of anemia, asthma, hyperlipidemia, GERD, hypertension, colorectal cancer who presented with intractable diarrhea secondary to recent radiation therapy, weakness.  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  Oncology, general surgery, palliative care following.  Hospital course remarkable for persistent diarrhea, weakness.  Overall condition slowly improving, diuretics resumed.  Still has poor oral intake.  On Lomotil, Imodium, Questran, subcutaneous octreotide.  Ultimate plan is to discharge her to skilled nursing  facility, but palliative discussions are pending.  Possibly will require home with palliative services.  Started on mirtazapine 10/4.  She has had worsening lethargy and has now been transitioned to comfort care on 10/6.  She is currently on comfort care and has a prognosis of hours to days  Review of Systems: Could not be obtained given patient condition.  Past Medical History:  Diagnosis Date   Allergy    Anemia    Asthma    Elevated cholesterol    GERD (gastroesophageal reflux disease)    Hypertension     Past Surgical History:  Procedure Laterality Date   BREAST SURGERY  80's   right breast   ORIF ANKLE FRACTURE Right 11/06/2013   Procedure: OPEN REDUCTION INTERNAL FIXATION (ORIF) ANKLE FRACTURE;  Surgeon: Sanjuana Kava, MD;  Location: [facility];  Service: Orthopedics;  Laterality: Right;   TUBAL LIGATION       reports that she has never smoked. She has never used smokeless tobacco. She reports that she does not drink alcohol and does not use drugs.  Allergies  Allergen Reactions   Ibuprofen Rash    Family History  Problem Relation Age of Onset   Hypertension Mother    Diabetes Mother    Hypertension  Father    Heart disease Father    Cancer Sister        brain, breast   Hypertension Sister    Diabetes Sister    Hypertension Sister    Heart disease Brother        congestive heart failure   Diabetes Maternal Grandmother    Diabetes Paternal Grandmother    Hypertension Son    Diabetes Son    Stomach cancer Neg Hx    Esophageal cancer Neg Hx    Colon cancer Neg Hx     Prior to Admission medications   Medication Sig Start Date End Date Taking? Authorizing Provider  capecitabine (XELODA) 500 MG tablet Take 3 tablets (1,500 mg total) by mouth 2 (two) times daily after a meal. Take Monday-Friday. Take only on days of radiation. 09/17/21  Yes Derek Jack, MD  diphenoxylate-atropine (LOMOTIL) 2.5-0.025 MG tablet Take 1 tablet by mouth 4 (four) times daily as needed for diarrhea or loose stools. 10/21/21  Yes Derek Jack, MD  fluticasone (FLONASE) 50 MCG/ACT nasal spray Place 2 sprays into both nostrils daily. Patient taking differently: Place 2 sprays into both nostrils daily as needed for allergies. 02/25/21  Yes Martin, Mary-Margaret, FNP  Fluticasone Furoate (ARNUITY ELLIPTA) 100 MCG/ACT AEPB Inhale 1 Dose into the lungs daily. Take one inhalation orally daily. 08/25/21  Yes Martin, Mary-Margaret, FNP  Iron, Ferrous Sulfate, 325 (65 Fe) MG TABS Take 325 mg by mouth daily. 08/25/21  Yes Martin, Mary-Margaret, FNP  LORazepam (ATIVAN) 0.5 MG tablet Take 1 tablet (0.5  mg total) by mouth every 8 (eight) hours as needed for anxiety. 11/22/21 11/22/22 Yes Rosealyn Little D, DO  morphine 20 MG/5ML solution Take 0.6 mLs (2.4 mg total) by mouth every 2 (two) hours as needed for pain (dyspnea or anxiety). 11/22/21  Yes Patsye Sullivant D, DO  olmesartan-hydrochlorothiazide (BENICAR HCT) 40-25 MG tablet TAKE ONE (1) TABLET EACH DAY Patient taking differently: Take 0.5 tablets by mouth daily. 08/25/21  Yes Hassell Done, Mary-Margaret, FNP  prochlorperazine (COMPAZINE) 10 MG tablet Take 1 tablet (10 mg total)  by mouth every 6 (six) hours as needed for nausea or vomiting. 09/18/21  Yes Derek Jack, MD  Vitamin D, Ergocalciferol, (DRISDOL) 1.25 MG (50000 UNIT) CAPS capsule Take 1 capsule (50,000 Units total) by mouth every 7 (seven) days. 08/25/21  Yes Chevis Pretty, FNP    Physical Exam: Vitals:   11/22/21 0500 11/22/21 1015 11/24/21 2120 11/25/21 1236  BP:   113/68 104/71  Pulse:   (!) 102 95  Resp:  (!) '6 20 19  '$ Temp:   98.2 F (36.8 C) 98.9 F (37.2 C)  TempSrc:   Oral   SpO2:   97% 96%  Weight: 60.3 kg     Height:        Constitutional: NAD, calm, comfortable Vitals:   11/22/21 0500 11/22/21 1015 11/24/21 2120 11/25/21 1236  BP:   113/68 104/71  Pulse:   (!) 102 95  Resp:  (!) '6 20 19  '$ Temp:   98.2 F (36.8 C) 98.9 F (37.2 C)  TempSrc:   Oral   SpO2:   97% 96%  Weight: 60.3 kg     Height:       Eyes: lids and conjunctivae normal Neck: normal, supple Respiratory: clear to auscultation bilaterally. Normal respiratory effort. No accessory muscle use.  Cardiovascular: Regular rate and rhythm, no murmurs. Abdomen: no tenderness, no distention. Bowel sounds positive.  Musculoskeletal:  No edema. Skin: no rashes, lesions, ulcers.  Psychiatric: Flat affect  Labs on Admission: I have personally reviewed following labs and imaging studies  CBC: Recent Labs  Lab 11/19/21 0553  WBC 10.4  HGB 10.5*  HCT 30.3*  MCV 91.8  PLT 637   Basic Metabolic Panel: Recent Labs  Lab 11/19/21 0339 11/20/21 0303  NA 132* 131*  K 3.4* 4.0  CL 98 97*  CO2 22 25  GLUCOSE 82 101*  BUN 17 20  CREATININE 0.56 0.59  CALCIUM 6.7* 6.9*  MG 2.1 2.1   GFR: Estimated Creatinine Clearance: 47.9 mL/min (by C-G formula based on SCr of 0.59 mg/dL). Liver Function Tests: No results for input(s): "AST", "ALT", "ALKPHOS", "BILITOT", "PROT", "ALBUMIN" in the last 168 hours. No results for input(s): "LIPASE", "AMYLASE" in the last 168 hours. No results for input(s): "AMMONIA" in  the last 168 hours. Coagulation Profile: No results for input(s): "INR", "PROTIME" in the last 168 hours. Cardiac Enzymes: No results for input(s): "CKTOTAL", "CKMB", "CKMBINDEX", "TROPONINI" in the last 168 hours. BNP (last 3 results) No results for input(s): "PROBNP" in the last 8760 hours. HbA1C: No results for input(s): "HGBA1C" in the last 72 hours. CBG: No results for input(s): "GLUCAP" in the last 168 hours. Lipid Profile: No results for input(s): "CHOL", "HDL", "LDLCALC", "TRIG", "CHOLHDL", "LDLDIRECT" in the last 72 hours. Thyroid Function Tests: No results for input(s): "TSH", "T4TOTAL", "FREET4", "T3FREE", "THYROIDAB" in the last 72 hours. Anemia Panel: No results for input(s): "VITAMINB12", "FOLATE", "FERRITIN", "TIBC", "IRON", "RETICCTPCT" in the last 72 hours. Urine analysis:  Component Value Date/Time   COLORURINE AMBER (A) 11/10/2021 0904   APPEARANCEUR CLOUDY (A) 11/10/2021 0904   APPEARANCEUR Cloudy (A) 06/07/2017 1748   LABSPEC 1.027 11/10/2021 0904   PHURINE 5.0 11/10/2021 0904   GLUCOSEU NEGATIVE 11/10/2021 0904   HGBUR MODERATE (A) 11/10/2021 0904   BILIRUBINUR NEGATIVE 11/10/2021 0904   BILIRUBINUR Negative 06/07/2017 1748   KETONESUR 20 (A) 11/10/2021 0904   PROTEINUR 30 (A) 11/10/2021 0904   NITRITE NEGATIVE 11/10/2021 0904   LEUKOCYTESUR MODERATE (A) 11/10/2021 0904    Radiological Exams on Admission: No results found.   Assessment/Plan Principal Problem:   Intractable diarrhea Active Problems:   Benign essential hypertension   Hypokalemia   Protein-calorie malnutrition, moderate (HCC)   Hypocalcemia   Malignant neoplasm of colon (HCC)   Enteritis   Proctitis   Pressure injury of skin   Intractable diarrhea: Secondary to radiation therapy resulting in radiation enteritis and proctocolitis --No  fever and No abdominal pain.  -WBC is down to 10.9 from 14.5 on 11/11/21  - C. difficile, GI pathogen panel negative.   -Continue Lomotil,  questran, Imodium and Octreotide.   -Diarrhea has been persistent -Rectal tube placed   Chemo and malignancy induced anorexia/FTT--- started Megace for appetite stimulation -Nutritional supplements as ordered -Started on mirtazapine 10/4   Vaginal bleeding---??  Radiation vaginitis -Hgb stable =-Outpatient follow-up with GYN advised   History of rectal cancer: Patient was on Xeloda and was receiving radiation treatment.  Patient is not interested on continuing chemotherapy. -  Patient following with surgery to discuss about surgical options -Hgb stable   COPD: Currently stable.  Continue bronchodilators as needed     Hypertension: Continue to hold olmesartan and  hydrochlorothiazide as BP is soft and patient had significant GI losses/diarrhea   Peripheral edema :   hypoalbuminemia.    Normocytic anemia: Currently hemoglobin stable   Weakness/debility:/Dispo PT/OT saw her, recommended SNF on discharge, now comfort care   Goals of care: Patient is now comfort care and awaiting home hospice placement.   Nutrition Problem: Increased nutrient needs Etiology: acute illness, cancer and cancer related treatments, chronic illness        Pressure Injury 11/12/21 Buttocks Right;Medial Stage 2 -  Partial thickness loss of dermis presenting as a shallow open injury with a red, pink wound bed without slough. 1cm x 1cm x 0cm (Active)  11/12/21 1000  Location: Buttocks  Location Orientation: Right;Medial  Staging: Stage 2 -  Partial thickness loss of dermis presenting as a shallow open injury with a red, pink wound bed without slough.  Wound Description (Comments): 1cm x 1cm x 0cm  Present on Admission: No  Dressing Type Foam - Lift dressing to assess site every shift 11/15/21 1945      DVT prophylaxis: None Code Status: DNR/CMO Family Communication:Son at bedside 10/10  Disposition Plan:GIP hospice Consults called:Palliative Admission status: Inpatient, Med/Surg  Severity of  Illness: The appropriate patient status for this patient is INPATIENT. Inpatient status is judged to be reasonable and necessary in order to provide the required intensity of service to ensure the patient's safety. The patient's presenting symptoms, physical exam findings, and initial radiographic and laboratory data in the context of their chronic comorbidities is felt to place them at high risk for further clinical deterioration. Furthermore, it is not anticipated that the patient will be medically stable for discharge from the hospital within 2 midnights of admission.   * I certify that at the point of admission it is my  clinical judgment that the patient will require inpatient hospital care spanning beyond 2 midnights from the point of admission due to high intensity of service, high risk for further deterioration and high frequency of surveillance required.*   Shaden Lacher D Rock Sobol DO [facility]  If 7PM-7AM, please contact night-coverage [email or web address]  11/25/2021, 4:59 PM

## 2021-11-25 NOTE — Progress Notes (Signed)
Palliative:  HPI:  76 y.o. female  with past medical history of colorectal cancer with concurrent chemoradiation, last treatment approximately 2 weeks ago, completed 19 sessions, HTN/HLD, GERD, anemia, asthma, admitted on 10/30/2021 with intractable diarrhea likely secondary to radiation therapy and resultant radiation enteritis.     I met today at Regina Knox's bedside after receiving report and update from bedside RN. Regina Knox rests comfortably while son Regina Knox and friend are at bedside. She has had improved pain management with low dose scheduled morphine per RN. Regina Knox reports that he is pleased as he does not feel this has been too strong as she is still able to awaken and speak with him at times but is overall more comfortable. He met with hospice for GIP admission. He continues to desire comfort care and does not desire to put her through a transfer given her frail state at end of life and pain with movement. We will continue comfort care and anticipate hospital death. I spoke with Regina Knox about self care and encouraged him to remember to eat, rest, and get fresh air and sunshine - he is trying to do better with support of his friend at bedside.   All questions/concerns addressed. Emotional support provided.   Exam: Sleeping peacefully. Pale. Thin, frail, cachectic. Breathing shallow.   Plan:  - Continue comfort care. - No changes to current medication regimen.  - Anticipate hospital death.   25 min  Regina Sill, NP Palliative Medicine Team Pager [phone] (Please see [email or web address] for schedule) Team Phone [phone]    Greater than 50%  of this time was spent counseling and coordinating care related to the above assessment and plan

## 2021-11-25 NOTE — Plan of Care (Signed)

## 2021-11-25 NOTE — Discharge Summary (Signed)
Physician Discharge Summary  Regina Knox:[id number] DOB: September 08, 1945 DOA: 10/30/2021  PCP: Chevis Pretty, FNP  Admit date: 10/30/2021  Discharge date: 11/25/2021  Admitted From:Home  Disposition:  GIP hospice  Discharge Condition:Stable  CODE STATUS: DNR/comfort care  Diet recommendation: Heart Healthy  Brief/Interim Summary:  Patient is a 76 year old female with history of anemia, asthma, hyperlipidemia, GERD, hypertension, colorectal cancer who presented with intractable diarrhea secondary to recent radiation therapy, weakness.  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  Oncology, general surgery, palliative care following.  Hospital course remarkable for persistent diarrhea, weakness.  Overall condition slowly improving, diuretics resumed.  Still has poor oral intake.  On Lomotil, Imodium, Questran, subcutaneous octreotide.  Ultimate plan is to discharge her to skilled nursing  facility, but palliative discussions are pending.  Possibly will require home with palliative services.  Started on mirtazapine 10/4.  She has had worsening lethargy and has now been transitioned to comfort care on 10/6.  She is currently on comfort care and has a prognosis of hours to days.  She will now be transition to inpatient comfort care/GIP.  Discharge Diagnoses:  Principal Problem:   Intractable diarrhea Active Problems:   Benign essential hypertension   Hypokalemia   Protein-calorie malnutrition, moderate (HCC)   Hypocalcemia   Malignant neoplasm of colon (HCC)   Enteritis   Proctitis   Pressure injury of skin  Principal discharge diagnosis: Chemo and malignancy induced anorexia/failure to thrive in the setting of radiation enteritis/proctocolitis.     Contact information for follow-up providers     [place] Follow up.   Why: [facility] staff will call you to schedule in home visits             Contact information for after-discharge  care     Destination     [facility] .   Service: Skilled Nursing Contact information: [address] [address] [phone]                    Allergies  Allergen Reactions   Ibuprofen Rash    Consultations: GI General surgery Palliative care   Procedures/Studies: CT ABDOMEN PELVIS W CONTRAST  Result Date: 11/10/2021 CLINICAL DATA:  Abdominal pain. Patient is currently on chemo and radiation for rectal cancer. EXAM: CT ABDOMEN AND PELVIS WITH CONTRAST TECHNIQUE: Multidetector CT imaging of the abdomen and pelvis was performed using the standard protocol following bolus administration of intravenous contrast. RADIATION DOSE REDUCTION: This exam was performed according to the departmental dose-optimization program which includes automated exposure control, adjustment of the mA and/or kV according to patient size and/or use of iterative reconstruction technique. CONTRAST:  75m OMNIPAQUE IOHEXOL 300 MG/ML  SOLN COMPARISON:  CT chest abdomen and pelvis 08/21/2021. MRI abdomen 09/29/2021. FINDINGS: Lower chest: There is a new small right pleural effusion. Hepatobiliary: Again seen is a calcified granuloma in the liver. No new hepatic lesions are seen. Gallbladder and bile ducts are within normal limits. Pancreas: Unremarkable. No pancreatic ductal dilatation or surrounding inflammatory changes. Spleen: Normal in size without focal abnormality. Adrenals/Urinary Tract: Adrenal glands are unremarkable. Kidneys are normal, without renal calculi, focal lesion, or hydronephrosis. Bladder is unremarkable. Stomach/Bowel: There is circumferential wall thickening and mild inflammation of the rectosigmoid colon. Air-fluid levels are seen throughout the remaining colon. Multiple small bowel loops in the pelvis and the terminal ileum demonstrate circumferential wall thickening with associated mesenteric edema and  mild  surrounding inflammation compatible with enteritis. There is upstream dilatation of small bowel with air-fluid levels measuring up to 3.2 cm. There is a large hiatal hernia. The stomach is nondilated. No evidence for pneumatosis, portal venous gas, or free air. Appendix is not seen. Vascular/Lymphatic: Aorta and IVC are normal in size. There are prominent perirectal lymph nodes present. These have decreased in size when compared to the prior study. No new enlarged lymph nodes are seen. Reproductive: Uterus and bilateral adnexa are unremarkable. Other: There is new small amount of free fluid throughout the abdomen and pelvis. There is new body wall edema. No focal abdominal wall hernia. Musculoskeletal: No acute or significant osseous findings. IMPRESSION: 1. There is wall thickening and inflammation of ileal loops and terminal ileum compatible with nonspecific enteritis. There is resulted upstream small bowel obstruction. 2. Rectosigmoid colon wall thickening compatible with proctocolitis. 3. Perirectal lymphadenopathy has mildly decreased. 4. New small volume ascites and anasarca. 5. New small right pleural effusion. Electronically Signed   By: Ronney Asters M.D.   On: 11/10/2021 18:31   DG Abd 2 Views  Result Date: 11/10/2021 CLINICAL DATA:  Diarrhea, abdominal pain EXAM: ABDOMEN - 2 VIEW COMPARISON:  Chest radiographs done earlier today FINDINGS: There is abnormal dilation of multiple small bowel loops in upper abdomen measuring up to 4.3 cm in diameter. Stomach is not distended. Colon is not distended. There is no pneumoperitoneum. There is 3 mm calcific density overlying the right kidney, possibly renal stone. Right pleural effusion is seen. IMPRESSION: There is abnormal dilation of small-bowel loops suggesting possible partial small bowel obstruction. If clinically warranted, follow-up CT may be considered. Possible small right renal stone. Electronically Signed   By: Elmer Picker M.D.   On:  11/10/2021 13:27   DG CHEST PORT 1 VIEW  Result Date: 11/10/2021 CLINICAL DATA:  Leukocytosis EXAM: PORTABLE CHEST 1 VIEW COMPARISON:  CT 08/21/2021 FINDINGS: The cardiomediastinal silhouette is within normal limits. There is a small right pleural effusion and adjacent right basilar opacities. Skin folds overlie the chest bilaterally. No evidence of pneumothorax. Dilated loops of small bowel in the left upper abdomen. IMPRESSION: Small right pleural effusion with adjacent right basilar opacities, could be atelectasis or infection. Dilated loops of small bowel in the upper left abdomen, correlate with exam and consider abdominal radiographs. Electronically Signed   By: Maurine Simmering M.D.   On: 11/10/2021 10:06   CT ABDOMEN PELVIS WO CONTRAST  Result Date: 11/04/2021 Shirline Frees     11/04/2021  5:42 PM Pt refused CT Abe/Pel scan     Discharge Exam: Vitals:   11/24/21 2120 11/25/21 1236  BP: 113/68 104/71  Pulse: (!) 102 95  Resp: 20 19  Temp: 98.2 F (36.8 C) 98.9 F (37.2 C)  SpO2: 97% 96%   Vitals:   11/22/21 0500 11/22/21 1015 11/24/21 2120 11/25/21 1236  BP:   113/68 104/71  Pulse:   (!) 102 95  Resp:  (!) '6 20 19  '$ Temp:   98.2 F (36.8 C) 98.9 F (37.2 C)  TempSrc:   Oral   SpO2:   97% 96%  Weight: 60.3 kg     Height:        General: Pt is alert, awake, not in acute distress Cardiovascular: RRR, S1/S2 +, no rubs, no gallops Respiratory: CTA bilaterally, no wheezing, no rhonchi Abdominal: Soft, NT, ND, bowel sounds + Extremities: no edema, no cyanosis    The results of significant diagnostics from this hospitalization (including  imaging, microbiology, ancillary and laboratory) are listed below for reference.     Microbiology: No results found for this or any previous visit (from the past 240 hour(s)).   Labs: BNP (last 3 results) No results for input(s): "BNP" in the last 8760 hours. Basic Metabolic Panel: Recent Labs  Lab 11/19/21 0339 11/20/21 0303  NA  132* 131*  K 3.4* 4.0  CL 98 97*  CO2 22 25  GLUCOSE 82 101*  BUN 17 20  CREATININE 0.56 0.59  CALCIUM 6.7* 6.9*  MG 2.1 2.1   Liver Function Tests: No results for input(s): "AST", "ALT", "ALKPHOS", "BILITOT", "PROT", "ALBUMIN" in the last 168 hours. No results for input(s): "LIPASE", "AMYLASE" in the last 168 hours. No results for input(s): "AMMONIA" in the last 168 hours. CBC: Recent Labs  Lab 11/19/21 0553  WBC 10.4  HGB 10.5*  HCT 30.3*  MCV 91.8  PLT 183   Cardiac Enzymes: No results for input(s): "CKTOTAL", "CKMB", "CKMBINDEX", "TROPONINI" in the last 168 hours. BNP: Invalid input(s): "POCBNP" CBG: No results for input(s): "GLUCAP" in the last 168 hours. D-Dimer No results for input(s): "DDIMER" in the last 72 hours. Hgb A1c No results for input(s): "HGBA1C" in the last 72 hours. Lipid Profile No results for input(s): "CHOL", "HDL", "LDLCALC", "TRIG", "CHOLHDL", "LDLDIRECT" in the last 72 hours. Thyroid function studies No results for input(s): "TSH", "T4TOTAL", "T3FREE", "THYROIDAB" in the last 72 hours.  Invalid input(s): "FREET3" Anemia work up No results for input(s): "VITAMINB12", "FOLATE", "FERRITIN", "TIBC", "IRON", "RETICCTPCT" in the last 72 hours. Urinalysis    Component Value Date/Time   COLORURINE AMBER (A) 11/10/2021 0904   APPEARANCEUR CLOUDY (A) 11/10/2021 0904   APPEARANCEUR Cloudy (A) 06/07/2017 1748   LABSPEC 1.027 11/10/2021 0904   PHURINE 5.0 11/10/2021 0904   GLUCOSEU NEGATIVE 11/10/2021 0904   HGBUR MODERATE (A) 11/10/2021 0904   BILIRUBINUR NEGATIVE 11/10/2021 0904   BILIRUBINUR Negative 06/07/2017 1748   KETONESUR 20 (A) 11/10/2021 0904   PROTEINUR 30 (A) 11/10/2021 0904   NITRITE NEGATIVE 11/10/2021 0904   LEUKOCYTESUR MODERATE (A) 11/10/2021 0904   Sepsis Labs Recent Labs  Lab 11/19/21 0553  WBC 10.4   Microbiology No results found for this or any previous visit (from the past 240 hour(s)).   Time coordinating  discharge: 35 minutes  SIGNED:   Rodena Goldmann, DO [facility] 11/25/2021, 4:03 PM  If 7PM-7AM, please contact night-coverage [email or web address]

## 2021-11-26 ENCOUNTER — Inpatient Hospital Stay (HOSPITAL_COMMUNITY)
Admission: RE | Admit: 2021-11-26 | Discharge: 2021-12-17 | DRG: 951 | Disposition: E | Source: Ambulatory Visit | Attending: Family Medicine | Admitting: Family Medicine

## 2021-11-26 ENCOUNTER — Encounter: Payer: Self-pay | Admitting: Hematology

## 2021-11-26 DIAGNOSIS — Z808 Family history of malignant neoplasm of other organs or systems: Secondary | ICD-10-CM

## 2021-11-26 DIAGNOSIS — Z66 Do not resuscitate: Secondary | ICD-10-CM | POA: Diagnosis present

## 2021-11-26 DIAGNOSIS — Z8249 Family history of ischemic heart disease and other diseases of the circulatory system: Secondary | ICD-10-CM

## 2021-11-26 DIAGNOSIS — R197 Diarrhea, unspecified: Secondary | ICD-10-CM | POA: Diagnosis present

## 2021-11-26 DIAGNOSIS — E876 Hypokalemia: Secondary | ICD-10-CM | POA: Diagnosis present

## 2021-11-26 DIAGNOSIS — C19 Malignant neoplasm of rectosigmoid junction: Secondary | ICD-10-CM | POA: Diagnosis present

## 2021-11-26 DIAGNOSIS — R627 Adult failure to thrive: Secondary | ICD-10-CM | POA: Diagnosis present

## 2021-11-26 DIAGNOSIS — C20 Malignant neoplasm of rectum: Secondary | ICD-10-CM | POA: Diagnosis present

## 2021-11-26 DIAGNOSIS — L89312 Pressure ulcer of right buttock, stage 2: Secondary | ICD-10-CM | POA: Diagnosis present

## 2021-11-26 DIAGNOSIS — K520 Gastroenteritis and colitis due to radiation: Secondary | ICD-10-CM | POA: Diagnosis present

## 2021-11-26 DIAGNOSIS — F419 Anxiety disorder, unspecified: Secondary | ICD-10-CM | POA: Diagnosis present

## 2021-11-26 DIAGNOSIS — J4489 Other specified chronic obstructive pulmonary disease: Secondary | ICD-10-CM | POA: Diagnosis present

## 2021-11-26 DIAGNOSIS — I10 Essential (primary) hypertension: Secondary | ICD-10-CM | POA: Diagnosis present

## 2021-11-26 DIAGNOSIS — D649 Anemia, unspecified: Secondary | ICD-10-CM | POA: Diagnosis present

## 2021-11-26 DIAGNOSIS — N760 Acute vaginitis: Secondary | ICD-10-CM | POA: Diagnosis present

## 2021-11-26 DIAGNOSIS — Y842 Radiological procedure and radiotherapy as the cause of abnormal reaction of the patient, or of later complication, without mention of misadventure at the time of the procedure: Secondary | ICD-10-CM | POA: Diagnosis present

## 2021-11-26 DIAGNOSIS — C189 Malignant neoplasm of colon, unspecified: Secondary | ICD-10-CM | POA: Diagnosis present

## 2021-11-26 DIAGNOSIS — R52 Pain, unspecified: Secondary | ICD-10-CM | POA: Diagnosis present

## 2021-11-26 DIAGNOSIS — E7800 Pure hypercholesterolemia, unspecified: Secondary | ICD-10-CM | POA: Diagnosis present

## 2021-11-26 DIAGNOSIS — Z803 Family history of malignant neoplasm of breast: Secondary | ICD-10-CM

## 2021-11-26 DIAGNOSIS — L8990 Pressure ulcer of unspecified site, unspecified stage: Secondary | ICD-10-CM | POA: Diagnosis present

## 2021-11-26 DIAGNOSIS — Z515 Encounter for palliative care: Secondary | ICD-10-CM | POA: Diagnosis present

## 2021-11-26 DIAGNOSIS — E8809 Other disorders of plasma-protein metabolism, not elsewhere classified: Secondary | ICD-10-CM | POA: Diagnosis present

## 2021-11-26 DIAGNOSIS — E440 Moderate protein-calorie malnutrition: Secondary | ICD-10-CM | POA: Diagnosis present

## 2021-11-26 DIAGNOSIS — K219 Gastro-esophageal reflux disease without esophagitis: Secondary | ICD-10-CM | POA: Diagnosis present

## 2021-11-26 DIAGNOSIS — G893 Neoplasm related pain (acute) (chronic): Secondary | ICD-10-CM | POA: Diagnosis present

## 2021-11-26 DIAGNOSIS — Z833 Family history of diabetes mellitus: Secondary | ICD-10-CM

## 2021-11-26 DIAGNOSIS — Z6823 Body mass index (BMI) 23.0-23.9, adult: Secondary | ICD-10-CM | POA: Diagnosis not present

## 2021-11-26 DIAGNOSIS — J45909 Unspecified asthma, uncomplicated: Secondary | ICD-10-CM | POA: Diagnosis present

## 2021-11-26 DIAGNOSIS — I499 Cardiac arrhythmia, unspecified: Secondary | ICD-10-CM | POA: Diagnosis present

## 2021-11-26 MED ORDER — ONDANSETRON HCL 4 MG PO TABS: 4.0000 mg | ORAL_TABLET | Freq: Four times a day (QID) | ORAL | Status: DC | PRN

## 2021-11-26 MED ORDER — ACETAMINOPHEN 650 MG RE SUPP: 650.0000 mg | Freq: Four times a day (QID) | RECTAL | Status: DC | PRN

## 2021-11-26 MED ORDER — BUDESONIDE 0.25 MG/2ML IN SUSP
0.2500 mg | Freq: Two times a day (BID) | RESPIRATORY_TRACT | Status: DC
  Filled 2021-11-26: qty 2

## 2021-11-26 MED ORDER — BUDESONIDE 0.25 MG/2ML IN SUSP: 0.2500 mg | Freq: Two times a day (BID) | RESPIRATORY_TRACT | Status: DC | PRN

## 2021-11-26 MED ORDER — GLYCOPYRROLATE 0.2 MG/ML IJ SOLN: 0.2000 mg | INTRAMUSCULAR | Status: DC | PRN

## 2021-11-26 MED ORDER — MORPHINE SULFATE (CONCENTRATE) 10 MG/0.5ML PO SOLN
10.0000 mg | ORAL | Status: DC | PRN
  Administered 2021-11-28 – 2021-11-29 (×5): 10 mg via ORAL
  Filled 2021-11-26 (×5): qty 0.5

## 2021-11-26 MED ORDER — ACETAMINOPHEN 325 MG PO TABS: 650.0000 mg | ORAL_TABLET | Freq: Four times a day (QID) | ORAL | Status: DC | PRN

## 2021-11-26 MED ORDER — OXYCODONE HCL 5 MG PO TABS
5.0000 mg | ORAL_TABLET | ORAL | Status: DC | PRN
  Administered 2021-11-27: 5 mg via ORAL
  Filled 2021-11-26: qty 1

## 2021-11-26 MED ORDER — ONDANSETRON HCL 4 MG/2ML IJ SOLN: 4.0000 mg | Freq: Four times a day (QID) | INTRAMUSCULAR | Status: DC | PRN

## 2021-11-26 MED ORDER — MORPHINE SULFATE (CONCENTRATE) 10 MG/0.5ML PO SOLN
5.0000 mg | Freq: Three times a day (TID) | ORAL | Status: DC
  Administered 2021-11-26 – 2021-11-29 (×4): 5 mg via SUBLINGUAL
  Filled 2021-11-26 (×5): qty 0.5

## 2021-11-26 MED ORDER — DICYCLOMINE HCL 20 MG PO TABS: 20.0000 mg | ORAL_TABLET | Freq: Three times a day (TID) | ORAL | Status: DC | PRN

## 2021-11-26 MED ORDER — BOOST / RESOURCE BREEZE PO LIQD CUSTOM: 1.0000 | Freq: Three times a day (TID) | ORAL | Status: DC

## 2021-11-26 MED ORDER — LORAZEPAM 2 MG/ML IJ SOLN: 1.0000 mg | INTRAMUSCULAR | Status: DC | PRN

## 2021-11-26 MED ORDER — SODIUM CHLORIDE 0.9 % IV SOLN: INTRAVENOUS | Status: DC

## 2021-11-26 MED ORDER — CHOLESTYRAMINE 4 G PO PACK: 4.0000 g | PACK | Freq: Every day | ORAL | Status: DC | PRN

## 2021-11-26 MED ORDER — HYDROCORTISONE 1 % EX CREA
TOPICAL_CREAM | Freq: Three times a day (TID) | CUTANEOUS | Status: DC
  Filled 2021-11-26: qty 28

## 2021-11-26 MED ORDER — POLYVINYL ALCOHOL 1.4 % OP SOLN: 1.0000 [drp] | Freq: Four times a day (QID) | OPHTHALMIC | Status: DC | PRN

## 2021-11-26 MED ORDER — DIPHENOXYLATE-ATROPINE 2.5-0.025 MG PO TABS: 2.0000 | ORAL_TABLET | Freq: Four times a day (QID) | ORAL | Status: DC | PRN

## 2021-11-26 MED ORDER — LORAZEPAM 1 MG PO TABS
1.0000 mg | ORAL_TABLET | ORAL | Status: DC | PRN
  Administered 2021-11-29 (×2): 1 mg via ORAL
  Filled 2021-11-26 (×2): qty 1

## 2021-11-26 MED ORDER — GLYCOPYRROLATE 1 MG PO TABS: 1.0000 mg | ORAL_TABLET | ORAL | Status: DC | PRN

## 2021-11-26 MED ORDER — CALCIUM CARBONATE ANTACID 500 MG PO CHEW: 1.0000 | CHEWABLE_TABLET | Freq: Three times a day (TID) | ORAL | Status: DC | PRN

## 2021-11-26 MED ORDER — LIDOCAINE 5 % EX PTCH
1.0000 | MEDICATED_PATCH | CUTANEOUS | Status: DC
  Filled 2021-11-26: qty 1

## 2021-11-26 MED ORDER — ALBUTEROL SULFATE (2.5 MG/3ML) 0.083% IN NEBU: 2.5000 mg | INHALATION_SOLUTION | RESPIRATORY_TRACT | Status: DC | PRN

## 2021-11-26 MED ORDER — PROMETHAZINE HCL 25 MG RE SUPP: 25.0000 mg | Freq: Three times a day (TID) | RECTAL | Status: DC | PRN

## 2021-11-26 NOTE — H&P (Signed)
History and Physical  [facility]  Regina Knox DXI:[id number] DOB: 10-05-1945 DOA: 12/06/2021  PCP: Chevis Pretty, FNP  Patient coming from: transfer to GIP status  Level of care: GIP   I have personally briefly reviewed patient's old medical records in [place]  Chief Complaint: n/a   HPI: Regina Knox is a 76 y.o. female with medical history significant for  history of anemia, asthma, hyperlipidemia, GERD, hypertension, colorectal cancer who presented with intractable diarrhea secondary to recent radiation therapy, weakness.  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  Oncology, general surgery, palliative care following.  Hospital course remarkable for persistent diarrhea, weakness.  Overall condition slowly improving, diuretics resumed.  Still has poor oral intake.  On Lomotil, Imodium, Questran, subcutaneous octreotide.  Ultimate plan is to discharge her to skilled nursing  facility, but palliative discussions are pending.  Possibly will require home with palliative services.  Started on mirtazapine 10/4.  She has had worsening lethargy and has now been transitioned to comfort care on 10/6.  She is currently on comfort care and has a prognosis of hours to days   Past Medical History:  Diagnosis Date   Allergy    Anemia    Asthma    Elevated cholesterol    GERD (gastroesophageal reflux disease)    Hypertension     Past Surgical History:  Procedure Laterality Date   BREAST SURGERY  80's   right breast   ORIF ANKLE FRACTURE Right 11/06/2013   Procedure: OPEN REDUCTION INTERNAL FIXATION (ORIF) ANKLE FRACTURE;  Surgeon: Sanjuana Kava, MD;  Location: [facility];  Service: Orthopedics;  Laterality: Right;   TUBAL LIGATION       reports that she has never smoked. She has never used smokeless tobacco. She reports that she does not drink alcohol and does not use drugs.  Allergies  Allergen Reactions   Ibuprofen Rash    Family History  Problem  Relation Age of Onset   Hypertension Mother    Diabetes Mother    Hypertension Father    Heart disease Father    Cancer Sister        brain, breast   Hypertension Sister    Diabetes Sister    Hypertension Sister    Heart disease Brother        congestive heart failure   Diabetes Maternal Grandmother    Diabetes Paternal Grandmother    Hypertension Son    Diabetes Son    Stomach cancer Neg Hx    Esophageal cancer Neg Hx    Colon cancer Neg Hx     Prior to Admission medications   Medication Sig Start Date End Date Taking? Authorizing Provider  diphenoxylate-atropine (LOMOTIL) 2.5-0.025 MG tablet Take 1 tablet by mouth 4 (four) times daily as needed for diarrhea or loose stools. 10/21/21   Derek Jack, MD  LORazepam (ATIVAN) 0.5 MG tablet Take 1 tablet (0.5 mg total) by mouth every 8 (eight) hours as needed for anxiety. 11/22/21 11/22/22  Manuella Ghazi, Pratik D, DO  morphine 20 MG/5ML solution Take 0.6 mLs (2.4 mg total) by mouth every 2 (two) hours as needed for pain (dyspnea or anxiety). 11/22/21   Manuella Ghazi, Pratik D, DO  prochlorperazine (COMPAZINE) 10 MG tablet Take 1 tablet (10 mg total) by mouth every 6 (six) hours as needed for nausea or vomiting. 09/18/21   Derek Jack, MD    Physical Exam:  Constitutional: patient appears terminally ill, no discomfort seen. Somnolent.  ENMT: Mucous membranes  are dry. Posterior pharynx clear of any exudate or lesions.Normal dentition.  Neck: no masses.  Respiratory: no increased work of breathing.  Cardiovascular: normal s1, s2 sounds  Abdomen: no tenderness.  Musculoskeletal: diffuse osteoarthritic changes.  Psychiatric: somnolent   Labs on Admission: I have personally reviewed following labs and imaging studies  CBC: No results for input(s): "WBC", "NEUTROABS", "HGB", "HCT", "MCV", "PLT" in the last 168 hours. Basic Metabolic Panel: Recent Labs  Lab 11/20/21 0303  NA 131*  K 4.0  CL 97*  CO2 25  GLUCOSE 101*  BUN 20   CREATININE 0.59  CALCIUM 6.9*  MG 2.1   GFR: Estimated Creatinine Clearance: 47.9 mL/min (by C-G formula based on SCr of 0.59 mg/dL). Liver Function Tests: No results for input(s): "AST", "ALT", "ALKPHOS", "BILITOT", "PROT", "ALBUMIN" in the last 168 hours. No results for input(s): "LIPASE", "AMYLASE" in the last 168 hours. No results for input(s): "AMMONIA" in the last 168 hours. Coagulation Profile: No results for input(s): "INR", "PROTIME" in the last 168 hours. Cardiac Enzymes: No results for input(s): "CKTOTAL", "CKMB", "CKMBINDEX", "TROPONINI" in the last 168 hours. BNP (last 3 results) No results for input(s): "PROBNP" in the last 8760 hours. HbA1C: No results for input(s): "HGBA1C" in the last 72 hours. CBG: No results for input(s): "GLUCAP" in the last 168 hours. Lipid Profile: No results for input(s): "CHOL", "HDL", "LDLCALC", "TRIG", "CHOLHDL", "LDLDIRECT" in the last 72 hours. Thyroid Function Tests: No results for input(s): "TSH", "T4TOTAL", "FREET4", "T3FREE", "THYROIDAB" in the last 72 hours. Anemia Panel: No results for input(s): "VITAMINB12", "FOLATE", "FERRITIN", "TIBC", "IRON", "RETICCTPCT" in the last 72 hours. Urine analysis:    Component Value Date/Time   COLORURINE AMBER (A) 11/10/2021 0904   APPEARANCEUR CLOUDY (A) 11/10/2021 0904   APPEARANCEUR Cloudy (A) 06/07/2017 1748   LABSPEC 1.027 11/10/2021 0904   PHURINE 5.0 11/10/2021 0904   GLUCOSEU NEGATIVE 11/10/2021 0904   HGBUR MODERATE (A) 11/10/2021 0904   BILIRUBINUR NEGATIVE 11/10/2021 0904   BILIRUBINUR Negative 06/07/2017 1748   KETONESUR 20 (A) 11/10/2021 0904   PROTEINUR 30 (A) 11/10/2021 0904   NITRITE NEGATIVE 11/10/2021 0904   LEUKOCYTESUR MODERATE (A) 11/10/2021 0904    Radiological Exams on Admission: No results found.  Assessment/Plan Principal Problem:   Comfort measures only status   Intractable diarrhea: Secondary to radiation therapy resulting in radiation enteritis and  proctocolitis --No  fever and No abdominal pain.  -WBC is down to 10.9 from 14.5 on 11/11/21  - C. difficile, GI pathogen panel negative.   -Continue Lomotil, questran, Imodium and Octreotide.   -Diarrhea has been persistent -Rectal tube placed for comfort    Chemo and malignancy induced anorexia/FTT- now Full comfort   Vaginal bleeding---??  Radiation vaginitis -Hgb stable -Full Comfort Measures    History of rectal cancer: Patient was on Xeloda and was receiving radiation treatment.  Patient is not interested on continuing chemotherapy. - FULL COMFORT MEASURES    COPD: Currently stable.  Continue bronchodilators as needed    Hypertension: Full Comfort Measures    Peripheral edema :  hypoalbuminemia---> FULL COMFORT     Normocytic anemia:  Comfort care only now    Weakness/debility: ---> Full Comfort Measures    Goals of care: FULL COMFORT Measures   DVT prophylaxis: full comfort care   Code Status: DNR   Family Communication: bedside update   Disposition Plan: TBD   Consults called:   Admission status: GIP   Level of care:  Irwin Brakeman MD [facility] How to contact the [facility] Attending or Consulting provider [place] or covering provider during after hours [place], for this patient?  Check the care team in [facility] and look for a) attending/consulting [facility] provider listed and b) the [facility] team listed Log into [email or web address] and use [place]'s universal password to access. If you do not have the password, please contact the hospital operator. Locate the [facility] provider you are looking for under [facility] and page to a number that you can be directly reached. If you still have difficulty reaching the provider, please page the [facility] (Director on Call) for the Hospitalists listed on amion for assistance.   If 7PM-7AM, please contact night-coverage [email or web address] Password [email or web address]  12/04/2021, 11:20 AM

## 2021-11-26 NOTE — Progress Notes (Signed)
PT Cancellation Note  Patient Details Name: Regina Knox MRN: [id number] DOB: [birth year]   Cancelled Treatment:    Reason Eval/Treat Not Completed: Other (comment).  Patient discharged from PT to care of nursing due to placed on comfort care only.   8:39 AM, 12/07/2021 Lonell Grandchild, MPT Physical Therapist with [facility] [phone] office [phone] mobile phone

## 2021-11-26 NOTE — Hospital Course (Signed)
76 y.o. female with medical history significant for  history of anemia, asthma, hyperlipidemia, GERD, hypertension, colorectal cancer who presented with intractable diarrhea secondary to recent radiation therapy, weakness.  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  Oncology, general surgery, palliative care following.  Hospital course remarkable for persistent diarrhea, weakness.  Overall condition slowly improving, diuretics resumed.  Still has poor oral intake.  On Lomotil, Imodium, Questran, subcutaneous octreotide.  Ultimate plan is to discharge her to skilled nursing  facility, but palliative discussions are pending.  Possibly will require home with palliative services.  Started on mirtazapine 10/4.  She has had worsening lethargy and has now been transitioned to comfort care on 10/6.  She is currently on comfort care and has a prognosis of hours to days

## 2021-11-27 ENCOUNTER — Encounter: Payer: Self-pay | Admitting: Hematology

## 2021-11-27 MED ORDER — GLYCOPYRROLATE 0.2 MG/ML IJ SOLN
0.4000 mg | INTRAMUSCULAR | Status: DC | PRN
  Administered 2021-11-28: 0.4 mg via INTRAVENOUS
  Filled 2021-11-27: qty 2

## 2021-11-27 NOTE — Progress Notes (Signed)
Patient rendered palliative care by staff with family members at bedside. Patient repositioned for comfort and medicated for pain noted by facial grimacing.Rectal tube and foley catheter intact. Pain relief effective, pt currently resting quietly with call bell in reach and family members at bedside.

## 2021-11-27 NOTE — Progress Notes (Signed)
  PROGRESS NOTE   COREAN YOSHIMURA  [id number] DOB: 01-27-1946 DOA: 11/16/2021 PCP: Chevis Pretty, FNP   Level of care: GIP   Brief Admission History:  76 y.o. female with medical history significant for  history of anemia, asthma, hyperlipidemia, GERD, hypertension, colorectal cancer who presented with intractable diarrhea secondary to recent radiation therapy, weakness.  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  Oncology, general surgery, palliative care following.  Hospital course remarkable for persistent diarrhea, weakness.  Overall condition slowly improving, diuretics resumed.  Still has poor oral intake.  On Lomotil, Imodium, Questran, subcutaneous octreotide.  Ultimate plan is to discharge her to skilled nursing  facility, but palliative discussions are pending.  Possibly will require home with palliative services.  Started on mirtazapine 10/4.  She has had worsening lethargy and has now been transitioned to comfort care on 10/6.  She is currently on comfort care and has a prognosis of hours to days   Assessment and Plan:  Rectal Cancer Cancer related pain Intractable diarrhea Anorexia Abnormal weight loss Failure to thrive HTN COPD Anemia   Pt is terminally ill and now on full comfort measures.  Continue to focus care on comfort.  Family remains at bedside.    DVT prophylaxis: Full Comfort  Code Status: DNR  Family Communication: son updated bedside 10/12 Disposition: GIP   Subjective: Pt somnolent   Objective: Vitals:    Intake/Output Summary (Last 24 hours) at 11/27/2021 1704 Last data filed at 11/27/2021 1300 Gross per 24 hour  Intake 240 ml  Output 200 ml  Net 40 ml   There were no vitals filed for this visit. Examination:  General exam: Appears calm and comfortable.  Appears terminally ill.  Respiratory system: shallow breathing seen. Gastrointestinal system: Abdomen is soft Skin: No gross rashes, lesions or ulcers. Psychiatry:  unable to determine due to somnolence.   Data Reviewed: I have personally reviewed following labs and imaging studies  CBC: No results for input(s): "WBC", "NEUTROABS", "HGB", "HCT", "MCV", "PLT" in the last 168 hours.  Basic Metabolic Panel: No results for input(s): "NA", "K", "CL", "CO2", "GLUCOSE", "BUN", "CREATININE", "CALCIUM", "MG", "PHOS" in the last 168 hours.  CBG: No results for input(s): "GLUCAP" in the last 168 hours.  No results found for this or any previous visit (from the past 240 hour(s)).   Radiology Studies: No results found.  Scheduled Meds:  feeding supplement  1 Container Oral TID BM   hydrocortisone cream   Topical TID   lidocaine  1 patch Transdermal Q24H   morphine CONCENTRATE  5 mg Sublingual TID   Continuous Infusions:   LOS: 1 day   Time spent: 15 mins  Marvie Calender Wynetta Emery, MD How to contact the [facility] Attending or Consulting provider [place] or covering provider during after hours [place], for this patient?  Check the care team in [facility] and look for a) attending/consulting TRH provider listed and b) the [facility] team listed Log into [email or web address] and use [place]'s universal password to access. If you do not have the password, please contact the hospital operator. Locate the [facility] provider you are looking for under [facility] and page to a number that you can be directly reached. If you still have difficulty reaching the provider, please page the [facility] (Director on Call) for the Hospitalists listed on amion for assistance.  11/27/2021, 5:04 PM

## 2021-11-28 MED ORDER — LIDOCAINE 5 % EX PTCH
1.0000 | MEDICATED_PATCH | Freq: Every day | CUTANEOUS | Status: DC | PRN
  Administered 2021-11-28: 1 via TRANSDERMAL
  Filled 2021-11-28: qty 1

## 2021-11-28 MED ORDER — HYDROCORTISONE 1 % EX CREA: TOPICAL_CREAM | Freq: Two times a day (BID) | CUTANEOUS | Status: DC

## 2021-11-28 MED ORDER — SCOPOLAMINE 1 MG/3DAYS TD PT72
1.0000 | MEDICATED_PATCH | TRANSDERMAL | Status: DC
  Administered 2021-11-28: 1.5 mg via TRANSDERMAL
  Filled 2021-11-28: qty 1

## 2021-11-28 NOTE — Progress Notes (Signed)
  PROGRESS NOTE   Regina Knox  [id number]:[id number] DOB: 1945/09/23 DOA: 12/07/2021 PCP: Chevis Pretty, FNP   Level of care: GIP   Brief Admission History:  76 y.o. female with medical history significant for  history of anemia, asthma, hyperlipidemia, GERD, hypertension, colorectal cancer who presented with intractable diarrhea secondary to recent radiation therapy, weakness.  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  Oncology, general surgery, palliative care following.  Hospital course remarkable for persistent diarrhea, weakness.  Overall condition slowly improving, diuretics resumed.  Still has poor oral intake.  On Lomotil, Imodium, Questran, subcutaneous octreotide.  Ultimate plan is to discharge her to skilled nursing  facility, but palliative discussions are pending.  Possibly will require home with palliative services.  Started on mirtazapine 10/4.  She has had worsening lethargy and has now been transitioned to comfort care on 10/6.  She is currently on comfort care and has a prognosis of hours to days   Assessment and Plan:  Rectal Cancer Cancer related pain Intractable diarrhea Anorexia Abnormal weight loss Failure to thrive HTN COPD Anemia   Pt is terminally ill and now on full comfort measures.  Continue to focus care on comfort.  Family remains at bedside.    DVT prophylaxis: Full Comfort  Code Status: DNR  Family Communication: son updated bedside 10/12 Disposition: GIP   Subjective: Pt somnolent   Objective: Vitals:    Intake/Output Summary (Last 24 hours) at 11/28/2021 1146 Last data filed at 11/28/2021 0947 Gross per 24 hour  Intake 0 ml  Output 400 ml  Net -400 ml   There were no vitals filed for this visit. Examination:  General exam: Appears calm and comfortable.  Appears terminally ill.  Pt is arousable to voice and touch.  Respiratory system: shallow breathing seen. Gastrointestinal system: Abdomen is soft Skin: No gross  rashes, lesions or ulcers. Psychiatry: unable to determine due to somnolence.   Data Reviewed: I have personally reviewed following labs and imaging studies  CBC: No results for input(s): "WBC", "NEUTROABS", "HGB", "HCT", "MCV", "PLT" in the last 168 hours.  Basic Metabolic Panel: No results for input(s): "NA", "K", "CL", "CO2", "GLUCOSE", "BUN", "CREATININE", "CALCIUM", "MG", "PHOS" in the last 168 hours.  CBG: No results for input(s): "GLUCAP" in the last 168 hours.  No results found for this or any previous visit (from the past 240 hour(s)).   Radiology Studies: No results found.  Scheduled Meds:  feeding supplement  1 Container Oral TID BM   hydrocortisone cream   Topical BID   morphine CONCENTRATE  5 mg Sublingual TID   scopolamine  1 patch Transdermal Q72H   Continuous Infusions:   LOS: 2 days   Time spent: 15 mins  Yamaira Spinner Wynetta Emery, MD How to contact the [facility] Attending or Consulting provider [place] or covering provider during after hours [place], for this patient?  Check the care team in [facility] and look for a) attending/consulting TRH provider listed and b) the [facility] team listed Log into [email or web address] and use [place]'s universal password to access. If you do not have the password, please contact the hospital operator. Locate the [facility] provider you are looking for under [facility] and page to a number that you can be directly reached. If you still have difficulty reaching the provider, please page the [facility] (Director on Call) for the Hospitalists listed on amion for assistance.  11/28/2021, 11:46 AM

## 2021-11-28 NOTE — Progress Notes (Signed)
Patient continues comfort care, repositioned for comfort and medicated for pain @ 0326 with effectiveness note within 10 minutes decreased restlessness, and facial grimacing.. Robinul changed to scopolamine patch due to no IV access. Patient currently resting quietly with family at bedside.

## 2021-11-28 NOTE — Plan of Care (Signed)
  Problem: Education: Goal: Knowledge of the prescribed therapeutic regimen will improve Outcome: Progressing   

## 2021-11-29 ENCOUNTER — Encounter (HOSPITAL_COMMUNITY): Payer: Self-pay | Admitting: Family Medicine

## 2021-11-29 DIAGNOSIS — R627 Adult failure to thrive: Secondary | ICD-10-CM | POA: Diagnosis present

## 2021-11-29 DIAGNOSIS — Z515 Encounter for palliative care: Principal | ICD-10-CM

## 2021-11-29 DIAGNOSIS — C20 Malignant neoplasm of rectum: Secondary | ICD-10-CM

## 2021-11-29 DIAGNOSIS — Z66 Do not resuscitate: Secondary | ICD-10-CM

## 2021-11-29 DIAGNOSIS — R52 Pain, unspecified: Secondary | ICD-10-CM

## 2021-11-29 MED ORDER — MORPHINE SULFATE (CONCENTRATE) 10 MG/0.5ML PO SOLN
20.0000 mg | ORAL | Status: DC | PRN
  Administered 2021-11-29: 20 mg via ORAL
  Filled 2021-11-29: qty 1

## 2021-11-29 MED ORDER — MORPHINE SULFATE (CONCENTRATE) 10 MG/0.5ML PO SOLN: 20.0000 mg | ORAL | Status: DC | PRN

## 2021-11-29 MED ORDER — MORPHINE SULFATE (CONCENTRATE) 10 MG/0.5ML PO SOLN: 20.0000 mg | Freq: Three times a day (TID) | ORAL | Status: DC

## 2021-11-29 MED ORDER — LORAZEPAM 2 MG/ML PO CONC: 1.5000 mg | ORAL | Status: DC | PRN

## 2021-12-17 NOTE — Death Summary Note (Addendum)
DEATH SUMMARY   Patient Details  Name: Regina Knox MRN: [id number] DOB: 12/31/1945  Admission/Discharge Information   Admit Date:  08-Dec-2021  Date of Death: Date of Death: 2021-12-11  Time of Death: Time of Death: 1625-05-11  Length of Stay: 3  Referring Physician: Chevis Pretty, FNP   Reason(s) for Hospitalization  76 y.o. female with medical history significant for  history of anemia, asthma, hyperlipidemia, GERD, hypertension, colorectal cancer who presented with intractable diarrhea secondary to recent radiation therapy, weakness.  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  Oncology, general surgery, palliative care following.  Hospital course remarkable for persistent diarrhea, weakness.  Overall condition slowly improving, diuretics resumed.  Still has poor oral intake.  On Lomotil, Imodium, Questran, subcutaneous octreotide.  Ultimate plan is to discharge her to skilled nursing  facility, but palliative discussions are pending.  Possibly will require home with palliative services.  Started on mirtazapine 10/4.  She developed worsening lethargy, uncontrolled pain, respiratory distress and has now been transitioned to full comfort care on 10/6.  She was placed in GIP status under hospice care.   Diagnoses  Preliminary cause of death: Colorectal Cancer  Secondary Diagnoses (including complications and co-morbidities):  Principal Problem:   Comfort measures only status Active Problems:   Asthma, chronic   Essential hypertension, benign   Irregular heart beat   Rectal cancer (HCC)   Gastroesophageal reflux disease without esophagitis   Intractable diarrhea   Hypokalemia   Protein-calorie malnutrition, moderate (HCC)   Hypocalcemia   Malignant neoplasm of colon (HCC)   Pressure injury of skin   Uncontrolled pain   DNR (do not resuscitate)   Failure to thrive in adult  Brief Hospital Course (including significant findings, care, treatment, and services  provided and events leading to death)    Colorectal cancer: Patient was on Xeloda and was receiving radiation treatment.  Patient not interested on continuing chemotherapy. - FULL COMFORT MEASURES PER PT AND FAMILY REQUEST   DNR (Do Not Resuscitate)  Chemotherapy and malignancy induced anorexia/Failure to thrive - now Full comfort   Failure to thrive   Vaginal bleeding---due to Radiation vaginitis -Full Comfort Measures    Intractable diarrhea: Secondary to radiation therapy resulting in radiation enteritis and proctocolitis -Medical treatments have failed to control symptoms   -Diarrhea has been persistent -Rectal tube placed for comfort measures     COPD with acute respiratory distress: morphine ordered for respiratory symptoms     Hypertension: Full Comfort Measures    Peripheral edema :  hypoalbuminemia---> FULL COMFORT     Normocytic anemia:  Comfort care only now    Weakness/debility: ---> Full Comfort Measures      Pertinent Labs and Studies  Significant Diagnostic Studies CT ABDOMEN PELVIS W CONTRAST  Result Date: 11/10/2021 CLINICAL DATA:  Abdominal pain. Patient is currently on chemo and radiation for rectal cancer. EXAM: CT ABDOMEN AND PELVIS WITH CONTRAST TECHNIQUE: Multidetector CT imaging of the abdomen and pelvis was performed using the standard protocol following bolus administration of intravenous contrast. RADIATION DOSE REDUCTION: This exam was performed according to the departmental dose-optimization program which includes automated exposure control, adjustment of the mA and/or kV according to patient size and/or use of iterative reconstruction technique. CONTRAST:  71m OMNIPAQUE IOHEXOL 300 MG/ML  SOLN COMPARISON:  CT chest abdomen and pelvis 08/21/2021. MRI abdomen 09/29/2021. FINDINGS: Lower chest: There is a new small right pleural effusion. Hepatobiliary: Again seen is a calcified granuloma in the liver. No new hepatic lesions are  seen. Gallbladder and  bile ducts are within normal limits. Pancreas: Unremarkable. No pancreatic ductal dilatation or surrounding inflammatory changes. Spleen: Normal in size without focal abnormality. Adrenals/Urinary Tract: Adrenal glands are unremarkable. Kidneys are normal, without renal calculi, focal lesion, or hydronephrosis. Bladder is unremarkable. Stomach/Bowel: There is circumferential wall thickening and mild inflammation of the rectosigmoid colon. Air-fluid levels are seen throughout the remaining colon. Multiple small bowel loops in the pelvis and the terminal ileum demonstrate circumferential wall thickening with associated mesenteric edema and mild surrounding inflammation compatible with enteritis. There is upstream dilatation of small bowel with air-fluid levels measuring up to 3.2 cm. There is a large hiatal hernia. The stomach is nondilated. No evidence for pneumatosis, portal venous gas, or free air. Appendix is not seen. Vascular/Lymphatic: Aorta and IVC are normal in size. There are prominent perirectal lymph nodes present. These have decreased in size when compared to the prior study. No new enlarged lymph nodes are seen. Reproductive: Uterus and bilateral adnexa are unremarkable. Other: There is new small amount of free fluid throughout the abdomen and pelvis. There is new body wall edema. No focal abdominal wall hernia. Musculoskeletal: No acute or significant osseous findings. IMPRESSION: 1. There is wall thickening and inflammation of ileal loops and terminal ileum compatible with nonspecific enteritis. There is resulted upstream small bowel obstruction. 2. Rectosigmoid colon wall thickening compatible with proctocolitis. 3. Perirectal lymphadenopathy has mildly decreased. 4. New small volume ascites and anasarca. 5. New small right pleural effusion. Electronically Signed   By: Ronney Asters M.D.   On: 11/10/2021 18:31   DG Abd 2 Views  Result Date: 11/10/2021 CLINICAL DATA:  Diarrhea, abdominal pain EXAM:  ABDOMEN - 2 VIEW COMPARISON:  Chest radiographs done earlier today FINDINGS: There is abnormal dilation of multiple small bowel loops in upper abdomen measuring up to 4.3 cm in diameter. Stomach is not distended. Colon is not distended. There is no pneumoperitoneum. There is 3 mm calcific density overlying the right kidney, possibly renal stone. Right pleural effusion is seen. IMPRESSION: There is abnormal dilation of small-bowel loops suggesting possible partial small bowel obstruction. If clinically warranted, follow-up CT may be considered. Possible small right renal stone. Electronically Signed   By: Elmer Picker M.D.   On: 11/10/2021 13:27   DG CHEST PORT 1 VIEW  Result Date: 11/10/2021 CLINICAL DATA:  Leukocytosis EXAM: PORTABLE CHEST 1 VIEW COMPARISON:  CT 08/21/2021 FINDINGS: The cardiomediastinal silhouette is within normal limits. There is a small right pleural effusion and adjacent right basilar opacities. Skin folds overlie the chest bilaterally. No evidence of pneumothorax. Dilated loops of small bowel in the left upper abdomen. IMPRESSION: Small right pleural effusion with adjacent right basilar opacities, could be atelectasis or infection. Dilated loops of small bowel in the upper left abdomen, correlate with exam and consider abdominal radiographs. Electronically Signed   By: Maurine Simmering M.D.   On: 11/10/2021 10:06   CT ABDOMEN PELVIS WO CONTRAST  Result Date: 11/04/2021 Shirline Frees     11/04/2021  5:42 PM Pt refused CT Abe/Pel scan    Microbiology No results found for this or any previous visit (from the past 240 hour(s)).  Lab Basic Metabolic Panel: No results for input(s): "NA", "K", "CL", "CO2", "GLUCOSE", "BUN", "CREATININE", "CALCIUM", "MG", "PHOS" in the last 168 hours. Liver Function Tests: No results for input(s): "AST", "ALT", "ALKPHOS", "BILITOT", "PROT", "ALBUMIN" in the last 168 hours. No results for input(s): "LIPASE", "AMYLASE" in the last 168 hours. No  results for input(s): "AMMONIA" in the last 168 hours. CBC: No results for input(s): "WBC", "NEUTROABS", "HGB", "HCT", "MCV", "PLT" in the last 168 hours. Cardiac Enzymes: No results for input(s): "CKTOTAL", "CKMB", "CKMBINDEX", "TROPONINI" in the last 168 hours. Sepsis Labs: No results for input(s): "PROCALCITON", "WBC", "LATICACIDVEN" in the last 168 hours.  Procedures/Operations   Luian Schumpert December 29, 2021, 4:59 PM  How to contact the [facility] Attending or Consulting provider [place] or covering provider during after hours [place], for this patient?  Check the care team in [facility] and look for a) attending/consulting TRH provider listed and b) the [facility] team listed Log into [email or web address] and use [place]'s universal password to access. If you do not have the password, please contact the hospital operator. Locate the [facility] provider you are looking for under [facility] and page to a number that you can be directly reached. If you still have difficulty reaching the provider, please page the [facility] (Director on Call) for the Hospitalists listed on amion for assistance.

## 2021-12-17 NOTE — Progress Notes (Signed)
PROGRESS NOTE   Regina Knox  FTD:[id number] DOB: 09/24/1945 DOA: 12/01/2021 PCP: Chevis Pretty, FNP   Level of care: GIP   Brief Admission History:  76 y.o. female with medical history significant for  history of anemia, asthma, hyperlipidemia, GERD, hypertension, colorectal cancer who presented with intractable diarrhea secondary to recent radiation therapy, weakness.  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  Oncology, general surgery, palliative care following.  Hospital course remarkable for persistent diarrhea, weakness.  Overall condition slowly improving, diuretics resumed.  Still has poor oral intake.  On Lomotil, Imodium, Questran, subcutaneous octreotide.  Ultimate plan is to discharge her to skilled nursing  facility, but palliative discussions are pending.  Possibly will require home with palliative services.  Started on mirtazapine 10/4.  She has had worsening lethargy and has now been transitioned to comfort care on 10/6.  She is currently on comfort care and has a prognosis of hours to days   Assessment and Plan:  Rectal Cancer Cancer related pain Intractable diarrhea Anorexia Abnormal weight loss Failure to thrive HTN COPD Anemia   Pt is terminally ill and now on full comfort measures.  Continue to focus care on comfort.  Family remains at bedside.    DVT prophylaxis: Full Comfort  Code Status: DNR  Family Communication: son updated bedside 10/12, 10/14 Disposition: GIP   Subjective: Pt had a difficult night, had to get some prn morphine injections for discomfort, now seems much more comfortable.   Objective: Vitals:    Intake/Output Summary (Last 24 hours) at 25-Dec-2021 1236 Last data filed at December 25, 2021 0900 Gross per 24 hour  Intake 0 ml  Output 100 ml  Net -100 ml   There were no vitals filed for this visit. Examination:  General exam: Appears calm and comfortable.  Appears terminally ill.  Pt is arousable to voice and  touch.  Respiratory system: shallow breathing seen. Gastrointestinal system: Abdomen is soft Skin: No gross rashes, lesions or ulcers. Psychiatry: unable to determine due to somnolence.   Data Reviewed: I have personally reviewed following labs and imaging studies  CBC: No results for input(s): "WBC", "NEUTROABS", "HGB", "HCT", "MCV", "PLT" in the last 168 hours.  Basic Metabolic Panel: No results for input(s): "NA", "K", "CL", "CO2", "GLUCOSE", "BUN", "CREATININE", "CALCIUM", "MG", "PHOS" in the last 168 hours.  CBG: No results for input(s): "GLUCAP" in the last 168 hours.  No results found for this or any previous visit (from the past 240 hour(s)).   Radiology Studies: No results found.  Scheduled Meds:  feeding supplement  1 Container Oral TID BM   hydrocortisone cream   Topical BID   morphine CONCENTRATE  5 mg Sublingual TID   scopolamine  1 patch Transdermal Q72H   Continuous Infusions:   LOS: 3 days   Time spent: 15 mins  Arnesia Vincelette Wynetta Emery, MD How to contact the [facility] Attending or Consulting provider [place] or covering provider during after hours [place], for this patient?  Check the care team in [facility] and look for a) attending/consulting TRH provider listed and b) the [facility] team listed Log into [email or web address] and use 's universal password to access. If you do not have the password, please contact the hospital operator. Locate the [facility] provider you are looking for under [facility] and page to a number that you can be directly reached. If you still have difficulty reaching the provider, please page the [facility] (Director on Call) for the Hospitalists listed on amion for assistance.  12/22/21, 12:36 PM

## 2021-12-17 NOTE — Progress Notes (Signed)
Heard crying in the hallway, pt's family member running down hall calling for assistance, "Please come check her, I think she's gone." Entered room to find pt apneic and pulseless. Confirmed absence of resp and heart beat by auscultation for one minute, confirmed with second nurse, Peyton Bottoms, LPN. TOD confirmed at 1627. Family members in the room offered sympathy and consoled. Son called by Marianna Payment, LPN. MD Wynetta Emery notified that pt expired.

## 2021-12-17 DEATH — deceased

## 2022-02-26 ENCOUNTER — Ambulatory Visit: Payer: Medicare HMO | Admitting: Nurse Practitioner
